# Patient Record
Sex: Male | Born: 1945
Health system: Southern US, Community
[De-identification: ages and names within clinical notes are randomized; demographics above are authoritative.]

## PROBLEM LIST (undated history)

## (undated) DIAGNOSIS — Z720 Tobacco use: Secondary | ICD-10-CM

## (undated) DIAGNOSIS — N400 Enlarged prostate without lower urinary tract symptoms: Secondary | ICD-10-CM

## (undated) DIAGNOSIS — D509 Iron deficiency anemia, unspecified: Secondary | ICD-10-CM

## (undated) DIAGNOSIS — D649 Anemia, unspecified: Secondary | ICD-10-CM

## (undated) DIAGNOSIS — I6529 Occlusion and stenosis of unspecified carotid artery: Secondary | ICD-10-CM

## (undated) DIAGNOSIS — I679 Cerebrovascular disease, unspecified: Secondary | ICD-10-CM

## (undated) DIAGNOSIS — E119 Type 2 diabetes mellitus without complications: Secondary | ICD-10-CM

## (undated) DIAGNOSIS — I2510 Atherosclerotic heart disease of native coronary artery without angina pectoris: Secondary | ICD-10-CM

## (undated) DIAGNOSIS — E785 Hyperlipidemia, unspecified: Secondary | ICD-10-CM

## (undated) DIAGNOSIS — Z860101 Personal history of adenomatous and serrated colon polyps: Secondary | ICD-10-CM

## (undated) DIAGNOSIS — I10 Essential (primary) hypertension: Secondary | ICD-10-CM

## (undated) DIAGNOSIS — Z86010 Personal history of colonic polyps: Secondary | ICD-10-CM

## (undated) HISTORY — DX: Tobacco use: Z72.0

## (undated) HISTORY — PX: CORONARY ARTERY BYPASS GRAFT: SHX141

## (undated) HISTORY — DX: Benign prostatic hyperplasia without lower urinary tract symptoms: N40.0

## (undated) HISTORY — DX: Atherosclerotic heart disease of native coronary artery without angina pectoris: I25.10

## (undated) HISTORY — DX: Occlusion and stenosis of unspecified carotid artery: I65.29

## (undated) HISTORY — PX: COLONOSCOPY W/ POLYPECTOMY: SHX1380

## (undated) HISTORY — DX: Hyperlipidemia, unspecified: E78.5

## (undated) HISTORY — DX: Essential (primary) hypertension: I10

## (undated) HISTORY — DX: Type 2 diabetes mellitus without complications: E11.9

## (undated) HISTORY — DX: Personal history of colonic polyps: Z86.010

## (undated) HISTORY — DX: Cerebrovascular disease, unspecified: I67.9

## (undated) HISTORY — DX: Iron deficiency anemia, unspecified: D50.9

## (undated) HISTORY — DX: Personal history of adenomatous and serrated colon polyps: Z86.0101

---

## 1994-07-25 HISTORY — PX: CERVICAL DISCECTOMY: SHX98

## 1998-11-27 ENCOUNTER — Encounter: Payer: Self-pay | Admitting: Neurosurgery

## 1998-11-27 ENCOUNTER — Ambulatory Visit (HOSPITAL_COMMUNITY): Admission: RE | Admit: 1998-11-27 | Discharge: 1998-11-27 | Payer: Self-pay | Admitting: Neurosurgery

## 1999-10-17 ENCOUNTER — Ambulatory Visit (HOSPITAL_COMMUNITY): Admission: RE | Admit: 1999-10-17 | Discharge: 1999-10-17 | Payer: Self-pay | Admitting: Neurosurgery

## 1999-10-17 ENCOUNTER — Encounter: Payer: Self-pay | Admitting: Neurosurgery

## 1999-11-25 ENCOUNTER — Encounter: Payer: Self-pay | Admitting: Neurosurgery

## 1999-11-25 ENCOUNTER — Ambulatory Visit (HOSPITAL_COMMUNITY): Admission: RE | Admit: 1999-11-25 | Discharge: 1999-11-25 | Payer: Self-pay | Admitting: Neurosurgery

## 1999-12-13 ENCOUNTER — Ambulatory Visit (HOSPITAL_COMMUNITY): Admission: RE | Admit: 1999-12-13 | Discharge: 1999-12-13 | Payer: Self-pay | Admitting: Neurosurgery

## 2000-11-15 ENCOUNTER — Ambulatory Visit (HOSPITAL_COMMUNITY): Admission: RE | Admit: 2000-11-15 | Discharge: 2000-11-15 | Payer: Self-pay | Admitting: Neurosurgery

## 2000-11-15 ENCOUNTER — Encounter: Payer: Self-pay | Admitting: Neurosurgery

## 2001-05-04 ENCOUNTER — Encounter: Payer: Self-pay | Admitting: Family Medicine

## 2001-05-04 ENCOUNTER — Ambulatory Visit (HOSPITAL_COMMUNITY): Admission: RE | Admit: 2001-05-04 | Discharge: 2001-05-04 | Payer: Self-pay | Admitting: Family Medicine

## 2002-07-05 ENCOUNTER — Emergency Department (HOSPITAL_COMMUNITY): Admission: EM | Admit: 2002-07-05 | Discharge: 2002-07-06 | Payer: Self-pay | Admitting: Internal Medicine

## 2002-07-24 ENCOUNTER — Encounter (HOSPITAL_COMMUNITY): Admission: RE | Admit: 2002-07-24 | Discharge: 2002-08-23 | Payer: Self-pay | Admitting: Family Medicine

## 2002-08-27 ENCOUNTER — Ambulatory Visit (HOSPITAL_COMMUNITY): Admission: RE | Admit: 2002-08-27 | Discharge: 2002-08-27 | Payer: Self-pay | Admitting: Family Medicine

## 2002-08-27 ENCOUNTER — Encounter: Payer: Self-pay | Admitting: Family Medicine

## 2002-09-04 ENCOUNTER — Ambulatory Visit (HOSPITAL_COMMUNITY): Admission: RE | Admit: 2002-09-04 | Discharge: 2002-09-04 | Payer: Self-pay | Admitting: Family Medicine

## 2002-09-04 ENCOUNTER — Encounter: Payer: Self-pay | Admitting: Family Medicine

## 2002-10-04 ENCOUNTER — Ambulatory Visit (HOSPITAL_COMMUNITY): Admission: RE | Admit: 2002-10-04 | Discharge: 2002-10-04 | Payer: Self-pay | Admitting: Family Medicine

## 2002-10-04 ENCOUNTER — Encounter: Payer: Self-pay | Admitting: Family Medicine

## 2002-12-05 ENCOUNTER — Ambulatory Visit (HOSPITAL_COMMUNITY): Admission: RE | Admit: 2002-12-05 | Discharge: 2002-12-05 | Payer: Self-pay | Admitting: *Deleted

## 2002-12-18 ENCOUNTER — Inpatient Hospital Stay (HOSPITAL_COMMUNITY)
Admission: RE | Admit: 2002-12-18 | Discharge: 2002-12-23 | Payer: Self-pay | Admitting: Thoracic Surgery (Cardiothoracic Vascular Surgery)

## 2002-12-18 ENCOUNTER — Encounter: Payer: Self-pay | Admitting: Thoracic Surgery (Cardiothoracic Vascular Surgery)

## 2002-12-19 ENCOUNTER — Encounter: Payer: Self-pay | Admitting: Thoracic Surgery (Cardiothoracic Vascular Surgery)

## 2002-12-20 ENCOUNTER — Encounter: Payer: Self-pay | Admitting: Thoracic Surgery (Cardiothoracic Vascular Surgery)

## 2002-12-31 ENCOUNTER — Ambulatory Visit (HOSPITAL_COMMUNITY): Admission: RE | Admit: 2002-12-31 | Discharge: 2002-12-31 | Payer: Self-pay | Admitting: *Deleted

## 2002-12-31 ENCOUNTER — Encounter: Payer: Self-pay | Admitting: *Deleted

## 2003-01-16 ENCOUNTER — Ambulatory Visit (HOSPITAL_COMMUNITY): Admission: RE | Admit: 2003-01-16 | Discharge: 2003-01-16 | Payer: Self-pay | Admitting: Cardiology

## 2003-02-03 ENCOUNTER — Encounter (HOSPITAL_COMMUNITY): Admission: RE | Admit: 2003-02-03 | Discharge: 2003-03-05 | Payer: Self-pay | Admitting: *Deleted

## 2003-02-04 ENCOUNTER — Encounter
Admission: RE | Admit: 2003-02-04 | Discharge: 2003-02-04 | Payer: Self-pay | Admitting: Thoracic Surgery (Cardiothoracic Vascular Surgery)

## 2003-02-04 ENCOUNTER — Encounter: Payer: Self-pay | Admitting: Thoracic Surgery (Cardiothoracic Vascular Surgery)

## 2003-06-09 ENCOUNTER — Emergency Department (HOSPITAL_COMMUNITY): Admission: EM | Admit: 2003-06-09 | Discharge: 2003-06-09 | Payer: Self-pay | Admitting: Emergency Medicine

## 2003-06-10 ENCOUNTER — Ambulatory Visit (HOSPITAL_COMMUNITY): Admission: RE | Admit: 2003-06-10 | Discharge: 2003-06-10 | Payer: Self-pay | Admitting: Family Medicine

## 2003-06-17 ENCOUNTER — Observation Stay (HOSPITAL_COMMUNITY): Admission: RE | Admit: 2003-06-17 | Discharge: 2003-06-18 | Payer: Self-pay

## 2003-06-17 ENCOUNTER — Encounter (INDEPENDENT_AMBULATORY_CARE_PROVIDER_SITE_OTHER): Payer: Self-pay | Admitting: *Deleted

## 2003-07-26 HISTORY — PX: CHOLECYSTECTOMY: SHX55

## 2004-03-19 ENCOUNTER — Ambulatory Visit (HOSPITAL_COMMUNITY): Admission: RE | Admit: 2004-03-19 | Discharge: 2004-03-19 | Payer: Self-pay | Admitting: Family Medicine

## 2004-04-14 ENCOUNTER — Ambulatory Visit (HOSPITAL_COMMUNITY): Admission: RE | Admit: 2004-04-14 | Discharge: 2004-04-14 | Payer: Self-pay | Admitting: Cardiology

## 2004-05-28 ENCOUNTER — Ambulatory Visit: Payer: Self-pay | Admitting: *Deleted

## 2004-11-29 ENCOUNTER — Ambulatory Visit (HOSPITAL_COMMUNITY): Admission: RE | Admit: 2004-11-29 | Discharge: 2004-11-29 | Payer: Self-pay | Admitting: Neurosurgery

## 2005-04-08 ENCOUNTER — Emergency Department (HOSPITAL_COMMUNITY): Admission: EM | Admit: 2005-04-08 | Discharge: 2005-04-08 | Payer: Self-pay | Admitting: Emergency Medicine

## 2005-07-20 ENCOUNTER — Emergency Department (HOSPITAL_COMMUNITY): Admission: EM | Admit: 2005-07-20 | Discharge: 2005-07-20 | Payer: Self-pay | Admitting: Emergency Medicine

## 2005-08-19 ENCOUNTER — Ambulatory Visit: Payer: Self-pay | Admitting: *Deleted

## 2005-10-17 ENCOUNTER — Ambulatory Visit: Payer: Self-pay | Admitting: *Deleted

## 2006-02-22 ENCOUNTER — Encounter: Admission: RE | Admit: 2006-02-22 | Discharge: 2006-02-22 | Payer: Self-pay | Admitting: Neurosurgery

## 2007-06-13 ENCOUNTER — Ambulatory Visit (HOSPITAL_COMMUNITY): Admission: RE | Admit: 2007-06-13 | Discharge: 2007-06-13 | Payer: Self-pay | Admitting: Family Medicine

## 2007-06-20 ENCOUNTER — Ambulatory Visit: Payer: Self-pay | Admitting: Urgent Care

## 2007-07-04 ENCOUNTER — Ambulatory Visit (HOSPITAL_COMMUNITY): Admission: RE | Admit: 2007-07-04 | Discharge: 2007-07-04 | Payer: Self-pay | Admitting: Gastroenterology

## 2007-07-04 ENCOUNTER — Encounter: Payer: Self-pay | Admitting: Gastroenterology

## 2007-07-04 ENCOUNTER — Ambulatory Visit: Payer: Self-pay | Admitting: Gastroenterology

## 2007-07-04 HISTORY — PX: ESOPHAGOGASTRODUODENOSCOPY: SHX1529

## 2009-02-11 ENCOUNTER — Ambulatory Visit (HOSPITAL_COMMUNITY): Admission: RE | Admit: 2009-02-11 | Discharge: 2009-02-11 | Payer: Self-pay | Admitting: Family Medicine

## 2009-04-06 DIAGNOSIS — I10 Essential (primary) hypertension: Secondary | ICD-10-CM | POA: Insufficient documentation

## 2009-04-06 DIAGNOSIS — E785 Hyperlipidemia, unspecified: Secondary | ICD-10-CM | POA: Insufficient documentation

## 2009-04-07 ENCOUNTER — Ambulatory Visit: Payer: Self-pay | Admitting: Cardiology

## 2009-04-07 DIAGNOSIS — I679 Cerebrovascular disease, unspecified: Secondary | ICD-10-CM | POA: Insufficient documentation

## 2009-04-07 DIAGNOSIS — F17200 Nicotine dependence, unspecified, uncomplicated: Secondary | ICD-10-CM | POA: Insufficient documentation

## 2009-04-07 DIAGNOSIS — Z87898 Personal history of other specified conditions: Secondary | ICD-10-CM | POA: Insufficient documentation

## 2009-04-07 DIAGNOSIS — E119 Type 2 diabetes mellitus without complications: Secondary | ICD-10-CM | POA: Insufficient documentation

## 2009-04-07 DIAGNOSIS — I2510 Atherosclerotic heart disease of native coronary artery without angina pectoris: Secondary | ICD-10-CM | POA: Insufficient documentation

## 2009-04-07 DIAGNOSIS — M5030 Other cervical disc degeneration, unspecified cervical region: Secondary | ICD-10-CM | POA: Insufficient documentation

## 2009-05-08 ENCOUNTER — Ambulatory Visit: Payer: Self-pay | Admitting: Cardiology

## 2009-05-08 ENCOUNTER — Ambulatory Visit (HOSPITAL_COMMUNITY): Admission: RE | Admit: 2009-05-08 | Discharge: 2009-05-08 | Payer: Self-pay | Admitting: Cardiology

## 2009-05-08 ENCOUNTER — Encounter: Payer: Self-pay | Admitting: Cardiology

## 2009-05-13 ENCOUNTER — Encounter: Payer: Self-pay | Admitting: Cardiology

## 2009-05-15 ENCOUNTER — Encounter: Payer: Self-pay | Admitting: Cardiology

## 2009-06-04 ENCOUNTER — Ambulatory Visit: Payer: Self-pay | Admitting: Cardiology

## 2009-06-05 ENCOUNTER — Encounter: Payer: Self-pay | Admitting: Cardiology

## 2009-06-10 ENCOUNTER — Ambulatory Visit (HOSPITAL_COMMUNITY): Admission: RE | Admit: 2009-06-10 | Discharge: 2009-06-10 | Payer: Self-pay | Admitting: Cardiology

## 2009-06-11 ENCOUNTER — Encounter (INDEPENDENT_AMBULATORY_CARE_PROVIDER_SITE_OTHER): Payer: Self-pay | Admitting: *Deleted

## 2009-10-06 ENCOUNTER — Ambulatory Visit: Payer: Self-pay | Admitting: Cardiology

## 2010-08-26 NOTE — Letter (Signed)
Summary: Stress Echocardiogram Information Sheet  [facility] at [facility]  [address] [place] [address]   Phone: [phone]  Fax: [phone]      April 07, 2009 MRN: [id number] light prior to the test.   Kyle Daniel  Doctor: Appointment Date: Appointment Time: Appointment Location: [facility]  Stress Echocardiogram Information Sheet    Instructions:   1. DO NOT  take your _________ medicine _______ days before the test.  2. Eat light prior to the test.  3. Dress prepared to exercise.  4. DO NOT use ANY caffine or tobacco products 3 hours before appointment.  5. Report to the Short Stay Center on the1st floor.  6. Please bring all current prescription medications.  7. If you have any questions, please call [phone]

## 2010-08-26 NOTE — Letter (Signed)
Summary: [place] Results Engineer, agricultural at [facility]  [address] [place] [address]   Phone: [phone]  Fax: [phone]      May 13, 2009 MRN: [id number]   [facility] [address] [place], [place]  [address]   Dear [name],  Your test ordered by Selena Batten has been reviewed by your physician (or physician assistant) and was found to be normal or stable. Your physician (or physician assistant) felt no changes were needed at this time.  __X__ Echocardiogram  ____ Cardiac Stress Test  ____ Lab Work  ____ Peripheral vascular study of arms, legs or neck  ____ CT scan or X-ray  ____ Lung or Breathing test  ____ Other:   Thank you.   Larita Fife Via LPN  Please remain on current medical treatment.  *[place] Bing, MD, F.A.C.Gaylord Shih, MD, F.A.C.C Lewayne Bunting, MD, F.A.C.C Nona Dell, MD, F.A.C.C Charlton Haws, MD, Lenise Arena.C.C

## 2010-08-26 NOTE — Letter (Signed)
Summary: labs from [facility]  labs from [facility]   Imported By: Dreama Saa, CNA 06/22/2009 15:59:57  _____________________________________________________________________  External Attachment:    Type:   Image     Comment:   External Document

## 2010-08-26 NOTE — Assessment & Plan Note (Signed)
Summary: PER PT PHONE CALL/TG   Visit Type:  Follow-up Referring Provider:  [facility] Primary Provider:  Dr.Mcinnis   History of Present Illness: Return visit for this nice gentleman with coronary disease and cardiovascular risk factors.  This visit was originally scheduled for October 22, but the patient felt that he had waited too long and walked out of the office.  His attitude seems good at present.  He is willing to cooperate in optimal management of his risk factors.  He is primarily followed at the [facility], where he is seen every 3 months.  He reports that a recent hemoglobin A1c level was 6.8.  HDL was 38 and LDL 125.  Unfortunately, he continues to smoke cigarettes.  An increased dose of Prevacid has been a ordered, but not yet received by him.  Blood pressures at home have been variable with systolics ranging from 130 to 160.   Current Medications (verified): 1)  Prazosin Hcl 5 Mg Caps (Prazosin Hcl) .... Take 1 Tablet At Bedtime 2)  Simvastatin 40 Mg Tabs (Simvastatin) .... Take 1 Tab Daily 3)  Aspirin 81 Mg Tbec (Aspirin) .... Take One Tablet By Mouth Daily 4)  Atenolol 50 Mg Tabs (Atenolol) .... Take 1 Tab Two Times A Day 5)  Finasteride 5 Mg Tabs (Finasteride) .... Take 1 Tab Daily 6)  Hydrochlorothiazide 25 Mg Tabs (Hydrochlorothiazide) .... Take 1 Tab Daily 7)  Ranitidine Hcl 150 Mg Tabs (Ranitidine Hcl) .... Take 1 Tab Two Times A Day 8)  Felodipine 10 Mg Xr24h-Tab (Felodipine) .... Take 1 Tab Daily 9)  Pantoprazole Sodium 40 Mg Tbec (Pantoprazole Sodium) .... Take 1 Tab Daily 10)  Actos 30 Mg Tabs (Pioglitazone Hcl) .... Take 1 Tab Daily 11)  Metformin Hcl 850 Mg Tabs (Metformin Hcl) .... Take 1 Tab Two Times A Day 12)  Ra Fish Oil 1000 Mg Caps (Omega-3 Fatty Acids) .... Take 2 Tabs Two Times A Day 13)  Niacin 500 Mg Tabs (Niacin) .... Take 3 Tablets By Mouth Daily 14)  Amlodipine Besylate 10 Mg Tabs (Amlodipine Besylate) .... Take 1/2 Tab  Daily 15)  Novolin N 100 Unit/ml Susp (Insulin Isophane Human) .... 35units Am and 25units Pm  Allergies (verified): No Known Drug Allergies  Review of Systems       see history of present illness  Vital Signs:  Patient profile:   65 year old male Weight:      268 pounds BMI:     35.49 Pulse rate:   68 / minute BP sitting:   156 / 80  (right arm)  Vitals Entered By: Dreama Saa, CNA (June 04, 2009 11:14 AM)  Physical Exam  General:   General-Well developed; no acute distress:Somewhat overweight   Neck-No JVD; bilateral carotid bruits: Lungs-No tachypnea, no rales; no rhonchi; no wheezes; median sternotomy scar Cardiovascular-normal PMI; distant S1 and S2 Abdomen-BS normal; soft and non-tender without masses or organomegaly:  Musculoskeletal-No deformities, no cyanosis or clubbing: Neurologic-Normal cranial nerves; symmetric strength and tone:  Skin-Warm, no significant lesions:Scar over harvest site for radial artery on right arm Extremities-Nl distal pulses; no edema:     Impression & Recommendations:  Problem # 1:  CEREBROVASCULAR DISEASE (ICD-437.9) Carotid ultrasound study to be performed and reviewed.  Problem # 2:  HYPERTENSION (ICD-401.9) Control of hypertension continues to be suboptimal, but increase therapy has not yet been initiated.  [name] will continue to monitor blood pressure, call for any significant elevations, [facility]  Problem # 3:  HYPERCHOLESTEROLEMIA (ICD-272.0) Based on the information available, controll of hyperlipidemia is suboptimal.  Niacin will be titrated to a total of 1500 mg per day.  Lipid status should be reassessed in 3 months at his next [place] visit.  I will plan to see this nice gentleman again in 4 months.  Other Orders: Carotid Duplex (Carotid Duplex)  Patient Instructions: 1)  Your physician recommends that you schedule a follow-up appointment in: 4 MONTHS 2)   Your physician has recommended you make the following change in your medication: INCREASE NIACIN TO 1000MG  ONCE DAILY X2 WEEKS THEN 1500MG  ONCE DAILY, DECREASE ASPIRIN TO 81MG  DAILY 3)  Your physician has requested that you have a carotid duplex. This test is an ultrasound of the carotid arteries in your neck. It looks at blood flow through these arteries that supply the brain with blood. Allow one hour for this exam. There are no restrictions or special instructions.

## 2010-08-26 NOTE — Letter (Signed)
Summary: [place] Results Engineer, agricultural at [facility]  [address] [place] [address]   Phone: [phone]  Fax: [phone]      June 11, 2009 MRN: [id number]   [facility] [address] [place], [place]  [address]   Dear Mr. Brooke Dare,  Your test ordered by Selena Batten has been reviewed by your physician (or physician assistant) and was found to be normal or stable. Your physician (or physician assistant) felt no changes were needed at this time.  ____ Echocardiogram  ____ Cardiac Stress Test  ____ Lab Work  __X__ Peripheral vascular study of arms, legs or neck  ____ CT scan or X-ray  ____ Lung or Breathing test  ____ Other:  No change in medical treatment at this time, per Dr. Dietrich Pates.  Thank you,   Trevor Duty Allyne Gee RN    [place] Bing, MD, Lenise Arena.C.Gaylord Shih, MD, F.A.C.C Lewayne Bunting, MD, F.A.C.C Nona Dell, MD, F.A.C.C Charlton Haws, MD, Lenise Arena.C.C

## 2010-08-26 NOTE — Assessment & Plan Note (Signed)
Summary: 1 mth f/u -***Pt not seen->Delete   Visit Type:  Follow-up    Current Medications (verified): 1)  Prazosin Hcl 5 Mg Caps (Prazosin Hcl) .... Take 1 Tablet At Bedtime 2)  Simvastatin 40 Mg Tabs (Simvastatin) .... Take 1 Tab Daily 3)  Aspirin 325 Mg Tabs (Aspirin) .... Take 1 Tab Daily 4)  Atenolol 50 Mg Tabs (Atenolol) .... Take 1 Tab Two Times A Day 5)  Finasteride 5 Mg Tabs (Finasteride) .... Take 1 Tab Daily 6)  Hydrochlorothiazide 25 Mg Tabs (Hydrochlorothiazide) .... Take 1 Tab Daily 7)  Ranitidine Hcl 150 Mg Tabs (Ranitidine Hcl) .... Take 1 Tab Two Times A Day 8)  Felodipine 10 Mg Xr24h-Tab (Felodipine) .... Take 1 Tab Daily 9)  Pantoprazole Sodium 40 Mg Tbec (Pantoprazole Sodium) .... Take 1 Tab Daily 10)  Actos 30 Mg Tabs (Pioglitazone Hcl) .... Take 1 Tab Daily 11)  Metformin Hcl 850 Mg Tabs (Metformin Hcl) .... Take 1 Tab Two Times A Day 12)  Ra Fish Oil 1000 Mg Caps (Omega-3 Fatty Acids) .... Take 2 Tabs Two Times A Day 13)  Niaspan 500 Mg Cr-Tabs (Niacin (Antihyperlipidemic)) .... Take 1 Tab Daily 14)  Amlodipine Besylate 10 Mg Tabs (Amlodipine Besylate) .... Take 1/2 Tab Daily 15)  Novolin N 100 Unit/ml Susp (Insulin Isophane Human) .... 35units Am and 25units Pm  Allergies (verified): No Known Drug Allergies  Vital Signs:  Patient profile:   64 year old male Weight:      271 pounds Pulse rate:   64 / minute BP sitting:   150 / 85  (right arm)  Vitals Entered By: Dreama Saa, CNA (May 15, 2009 1:20 PM)

## 2010-08-26 NOTE — Assessment & Plan Note (Signed)
Summary: PAST DUE FOR F/U/PREVIOUS DR.HARDIN PT/TG   Visit Type:  past due for fu Primary Provider:  Dr.Mcinnis   History of Present Illness: [name] is a 65 year old gentleman with coronary artery disease lost to followup in our practice for the past 3 years.  He has been followed medically at the [facility].  He is seen at his request for recurrent chest discomfort.  He describes  lancinating episodes of left upper chest discomfort that are sharp and last for a matter of seconds.  These are unrelated to exertion.  There is no radiation.  There were no associated symptoms.  Episodes occur once every few months.  He underwent cardiac catheterization in 2005 approximately one year after coronary artery bypass graft surgery, which revealed patent grafts.  He has had no recent stress test.  Unfortunately, he continues to smoke cigarettes at the rate of one pack per day.  Diabetic control has been good.  He believes it controlled hyperlipidemia has also been good.  Laboratory tests were obtained at the [place] a month ago, but he is unaware of the results.  Cardiac Cath  Procedure date:  04/14/2004  Findings:      HEMODYNAMICS:  RA 13, RV 37/11/17, PA 41/19/29, PCW 21/19/17, aorta  136/80/102, LV 140/12/20.  No aortic stenosis or mitral stenosis.   VENTRICULOGRAPHY:  1.  EF greater than 65% without regional wall motion abnormality.  No mitral      regurgitation.  2.  Left main:  60% stenosis of the mid vessel.  3.  LAD:  Moderate size vessel giving rise to a single diagonal branch.      There is a 50% stenosis of the proximal vessel.  The LIMA to LAD is      widely patent with excellent distal runoff.  The left subclavian is      without stenosis.  4.  Ramus intermedius:  70% proximal stenosis.  Widely patent free radial      graft with excellent distal runoff.  5.  Circumflex:  Moderate size vessel giving rise to a single obtuse      marginal.  There is a 60% stenosis in the  proximal vessel.  The      saphenous vein graft to the marginal is widely patent with excellent      distal runoff.  6.  RCA:  Relatively small though dominant vessel.  Minor luminal      irregularities along its course.   IMPRESSION/RECOMMENDATIONS:  Widely patent bypass grafts with excellent  distal runoff.  I suspect a noncardiac etiology to his chest discomfort.  Will continue medical therapy.    Current Medications (verified): 1)  Prazosin Hcl 5 Mg Caps (Prazosin Hcl) .... Take 1 Tablet At Bedtime 2)  Simvastatin 40 Mg Tabs (Simvastatin) .... Take 1 Tab Daily 3)  Aspirin 325 Mg Tabs (Aspirin) .... Take 1 Tab Daily 4)  Atenolol 50 Mg Tabs (Atenolol) .... Take 1 Tab Two Times A Day 5)  Finasteride 5 Mg Tabs (Finasteride) .... Take 1 Tab Daily 6)  Hydrochlorothiazide 25 Mg Tabs (Hydrochlorothiazide) .... Take 1 Tab Daily 7)  Ranitidine Hcl 150 Mg Tabs (Ranitidine Hcl) .... Take 1 Tab Two Times A Day 8)  Dicyclomine Hcl 20 Mg Tabs (Dicyclomine Hcl) .... Take 1 Tab Three Times A Day 9)  Felodipine 10 Mg Xr24h-Tab (Felodipine) .... Take 1 Tab Daily 10)  Niaspan 500 Mg Cr-Tabs (Niacin (Antihyperlipidemic)) .... Take 1 Tab Nightly 11)  Pantoprazole Sodium  40 Mg Tbec (Pantoprazole Sodium) .... Take 1 Tab Daily 12)  Actos 30 Mg Tabs (Pioglitazone Hcl) .... Take 1 Tab Daily 13)  Metformin Hcl 850 Mg Tabs (Metformin Hcl) .... Take 1 Tab Two Times A Day 14)  Ra Fish Oil 1000 Mg Caps (Omega-3 Fatty Acids) .... Take 2 Tabs Two Times A Day 15)  Niaspan 500 Mg Cr-Tabs (Niacin (Antihyperlipidemic)) .... Take 1 Tab Daily 16)  Amlodipine Besylate 10 Mg Tabs (Amlodipine Besylate) .... Take 1/2 Tab Daily 17)  Ranitidine Hcl 150 Mg Tabs (Ranitidine Hcl) .... Take 1 Tab Two Times A Day 18)  Novolin N 100 Unit/ml Susp (Insulin Isophane Human) .... 35units Am and 25units Pm  Allergies (verified): No Known Drug Allergies  Past History:  PMH, FH, and Social History reviewed and updated.  Past Medical  History: ASCVD--status post coronary artery bypass graft surgery in 2004; cardiac catheterization in 2005 revealed   patent grafts. Tobacco abuse--50 pack years; current--one pack per day HYPERCHOLESTEROLEMIA (ICD-272.0) HYPERTENSION (ICD-401.9) AODM--insulin requiring for the past 15 years Cervical spine disease Benign prostatic hypertrophy Cerebrovascular disease--bilateral bruits  Past Surgical History: 4 adenomatous polyps removed colonoscopy Cholecystectomy--2005 Coronary artery bypass graft surgery Anterior C.spine discectomy and fusion--1996  Family History: Reviewed history from 04/06/2009 and no changes required. Father:deceased age 91 secondary to pneumonia Mother:deceased age 41 secondary to diabectic complications Siblings:2 sisters with breast cancer,2 healthy sisters  Social History: Reviewed history from 04/06/2009 and no changes required. Retired  Married  Tobacco Use - Yes.  Alcohol Use - yes(stopped in early 1980's) Regular Exercise - no Drug Use - no  Review of Systems       Patient reports some minor symptoms of prostatism with nocturia and some hesitancy.  He has class III dyspnea on exertion.  He has had some abdominal discomfort in the past with endoscopy showing duodenitis.  All other systems reviewed and are negative.  Vital Signs:  Patient profile:   65 year old male Height:      73 inches Weight:      273 pounds BMI:     36.15 Pulse rate:   69 / minute BP sitting:   151 / 78  (right arm)  Vitals Entered By: Dreama Saa, CNA (April 07, 2009 2:04 PM)  Physical Exam  General:   General-Well developed; no acute distress:Somewhat overweight   Neck-No JVD; bilateral carotid bruits: Lungs-No tachypnea, no rales; no rhonchi; no wheezes: Cardiovascular-normal PMI; normal S1 and S2:minimal systolic murmur Abdomen-BS normal; soft and non-tender without masses or organomegaly:  Musculoskeletal-No deformities, no cyanosis or clubbing:  Neurologic-Normal cranial nerves; symmetric strength and tone:  Skin-Warm, no significant lesions:Scar over harvest site for radial artery on right arm Extremities-Nl distal pulses; no edema:     Impression & Recommendations:  Problem # 1:  ATHEROSCLEROTIC CARDIOVASCULAR DISEASE (ICD-429.2) Symptoms are atypical.  I doubt that these reflect recurrent myocardial ischemia.  Due to the changes on his electrocardiogram and the significant risk of progression of coronary disease at 6 years following coronary artery bypass graft surgery, stress testing is warranted.  We will proceed with a stress echocardiogram.  Orders: Stress Echo (Stress Echo)  Problem # 2:  TOBACCO ABUSE (ICD-305.1) The destructive nature of continued use of tobacco products was discussed with him.  He has tried Chantix and nicotine patches in the past without benefit.  I suggested that he seek a formal smoking cessation program, but do not believe that he is willing to accept that step at present.  Problem # 3:  HYPERTENSION (ICD-401.9) Control of hypertension is suboptimal.  We will request that the patient monitor his blood pressure at home.  His dose of Minipress will be increased to 5 mg q.d.  Problem # 4:  CEREBROVASCULAR DISEASE (ICD-437.9) Bilateral carotid bruits are present.  He describes no symptoms to suggest neurologic events.  A duplex study for many years ago was negative.  He will require repeat assessment in the near future.  Problem # 5:  DIABETES MELLITUS, TYPE II (ICD-250.00) We will seek results of her recent hemoglobin A1c level and lipid profile performed at the [facility].  I will reassess thisnice gentleman in the office in one month.  Patient Instructions: 1)  Your physician recommends that you schedule a follow-up appointment in: 1 month 2)  Your physician has recommended you make the following change in your medication: Start taking Prazosin 4mg  once daily  3)  Hold Atenolol for 2 days prior  to stress Echo 4)  Your physician has requested that you have a stress echocardiogram. For further information please visit [email or web address]  Please follow instruction sheet as given. Prescriptions: PRAZOSIN HCL 5 MG CAPS (PRAZOSIN HCL) take 1 tablet at bedtime  #30 x 11   Entered by:   Larita Fife Via LPN   Authorized by:   Kathlen Brunswick, MD, [facility]   Signed by:   Larita Fife Via LPN on 98/05/9146   Method used:   Print then Give to Patient   RxID:   [id number] PRAZOSIN HCL 2 MG CAPS (PRAZOSIN HCL) take 2 tablets (4mg ) by mouth once daily  #60 x 6   Entered by:   Larita Fife Via LPN   Authorized by:   Kathlen Brunswick, MD, [facility]   Signed by:   Larita Fife Via LPN on 96/29/5284   Method used:   Print then Give to Patient   RxID:   [id number]  RX for Prazosin 2mg  is incorrect (put in shred box). Patient given correct RX for 5mg . Larita Fife Via LPN  April 08, 2009 10:26 AM

## 2010-08-26 NOTE — Assessment & Plan Note (Signed)
Summary: F4M   Visit Type:  Follow-up Referring Provider:  [facility] Primary Provider:  Dr.Mcinnis   History of Present Illness: Return visit for this very pleasant 65 year old gentleman with coronary disease and multiple cardiovascular risk factors.  He denies all cardiopulmonary symptoms now and in the interval since bypass graft surgery 7 years ago.  He checks blood pressure occasionally with good results.  He exercises modestly.  Unfortunately he continues to smoke cigarettes.  Current Medications (verified): 1)  Prazosin Hcl 5 Mg Caps (Prazosin Hcl) .... Take 2 Tablets At Betime 2)  Simvastatin 40 Mg Tabs (Simvastatin) .... Take 1/2  Tab Daily 3)  Aspirin 81 Mg Tbec (Aspirin) .... Take One Tablet By Mouth Daily 4)  Atenolol 50 Mg Tabs (Atenolol) .... Take 1 Tab Two Times A Day 5)  Finasteride 5 Mg Tabs (Finasteride) .... Take 1 Tab Daily 6)  Hydrochlorothiazide 25 Mg Tabs (Hydrochlorothiazide) .... Take 1 Tab Daily 7)  Ranitidine Hcl 150 Mg Tabs (Ranitidine Hcl) .... Take 1 Tab Two Times A Day 8)  Actos 30 Mg Tabs (Pioglitazone Hcl) .... Take 1 Tab Daily 9)  Metformin Hcl 850 Mg Tabs (Metformin Hcl) .... Take 1 Tab Two Times A Day 10)  Ra Fish Oil 1000 Mg Caps (Omega-3 Fatty Acids) .... Take 2 Tabs Two Times A Day 11)  Niacin 500 Mg Tabs (Niacin) .... Take 3 Tablets By Mouth Daily 12)  Amlodipine Besylate 10 Mg Tabs (Amlodipine Besylate) .... Take 1/2 Tab Daily 13)  Novolin N 100 Unit/ml Susp (Insulin Isophane Human) .... 35units Am and 25units Pm 14)  Diclofenac Sodium 75 Mg Tbec (Diclofenac Sodium) .... Take 1 Tab Two Times A Day  Allergies (verified): No Known Drug Allergies  Past History:  PMH, FH, and Social History reviewed and updated.  Past Medical History: ASCVD--status post coronary artery bypass graft surgery in 2004; cardiac catheterization in 2005 revealed   patent grafts. Tobacco abuse--50 pack years; current-one pack per  day HYPERCHOLESTEROLEMIA (ICD-272.0) HYPERTENSION (ICD-401.9) AODM--insulin requiring for the past 15 years Cervical spine disease Benign prostatic hypertrophy Cerebrovascular disease--bilateral bruits  Review of Systems  The patient denies anorexia, fever, weight loss, weight gain, vision loss, decreased hearing, hoarseness, chest pain, syncope, dyspnea on exertion, peripheral edema, prolonged cough, headaches, hemoptysis, abdominal pain, melena, and hematochezia.    Vital Signs:  Patient profile:   65 year old male Weight:      268 pounds Pulse rate:   64 / minute BP sitting:   134 / 78  (right arm)  Vitals Entered By: Dreama Saa, CNA (October 06, 2009 11:14 AM)  Physical Exam  General:  Overweight; well developed; no acute distress   Neck-No JVD; faint bilateral carotid bruits: Lungs-No tachypnea, no rales; no rhonchi Cardiovascular-normal PMI; distant S1 and S2; minimal systolic murmur Abdomen-BS normal; soft and non-tender without masses or organomegaly:  Musculoskeletal-No deformities, no cyanosis or clubbing: Neurologic-Normal cranial nerves; symmetric strength and tone:  Skin-Warm, no significant lesions:Scar over harvest site for radial artery on right arm Extremities-Nl distal pulses; no edema:     Impression & Recommendations:  Problem # 1:  CEREBROVASCULAR DISEASE (ICD-437.9) Carotid ultrasound study shows extensive calcification and plaque without significant focal stenosis.  In the absence of symptoms, a followup study in approximately 2 years would be reasonable.  I explained to the patient that optimal management of risk factors is the most important approach to preventing symptomatic cerebrovascular disease.  Problem # 2:  ATHEROSCLEROTIC CARDIOVASCULAR DISEASE (ICD-429.2) Patient  continues to do well following CABG surgery. Risk factor management is the key to maintaining this state of affairs.  Problem # 3:  HYPERTENSION (ICD-401.9) Blood pressure  control is good on multiple agents, which will be continued.  Problem # 4:  HYPERCHOLESTEROLEMIA (ICD-272.0) Recent lipid profile from the [place] was good with total cholesterol 125, triglycerides 189, LDL of 54 and HDL of 33.  Current multidrug therapy will be continued.  Problem # 5:  DIABETES MELLITUS, TYPE II (ICD-250.00) Recent hemoglobin A1c level was 6.7 indicating excellent control of diabetes.  Problem # 6:  TOBACCO ABUSE (ICD-305.1) Patient and I had a long discussion about discontinuing tobacco products.  He has tried medications without any significant period of abstinence.  Transdermal nicotine preparations resulted skin irritation.  He has been fearful of Chantix due to possible adverse effects.  He tried Zyban without benefit.  He has participated in a number of smoking cessation programs without any positive results.  At this point, he is unwilling to accept any approach and will continue cigarette smoking knowing the risks.  I will reevaluate the status gentleman in one year.  Patient Instructions: 1)  Your physician recommends that you schedule a follow-up appointment in: 1 year 2)  Your physician discussed the importance of regular exercise and recommended that you start or continue a regular exercise program for good health.

## 2010-09-29 DIAGNOSIS — E119 Type 2 diabetes mellitus without complications: Secondary | ICD-10-CM

## 2010-09-29 DIAGNOSIS — Z9049 Acquired absence of other specified parts of digestive tract: Secondary | ICD-10-CM

## 2010-09-29 DIAGNOSIS — K219 Gastro-esophageal reflux disease without esophagitis: Secondary | ICD-10-CM

## 2010-10-20 ENCOUNTER — Ambulatory Visit (INDEPENDENT_AMBULATORY_CARE_PROVIDER_SITE_OTHER): Payer: Medicare Other | Admitting: Cardiology

## 2010-10-20 ENCOUNTER — Encounter: Payer: Self-pay | Admitting: Cardiology

## 2010-10-20 DIAGNOSIS — E782 Mixed hyperlipidemia: Secondary | ICD-10-CM

## 2010-10-20 DIAGNOSIS — E785 Hyperlipidemia, unspecified: Secondary | ICD-10-CM

## 2010-10-20 DIAGNOSIS — I679 Cerebrovascular disease, unspecified: Secondary | ICD-10-CM

## 2010-10-20 DIAGNOSIS — E119 Type 2 diabetes mellitus without complications: Secondary | ICD-10-CM

## 2010-10-20 DIAGNOSIS — F17200 Nicotine dependence, unspecified, uncomplicated: Secondary | ICD-10-CM

## 2010-10-20 DIAGNOSIS — I10 Essential (primary) hypertension: Secondary | ICD-10-CM

## 2010-10-20 DIAGNOSIS — I2510 Atherosclerotic heart disease of native coronary artery without angina pectoris: Secondary | ICD-10-CM

## 2010-10-20 MED ORDER — HYDROCHLOROTHIAZIDE 25 MG PO TABS: 12.5000 mg | ORAL_TABLET | Freq: Every day | ORAL | Status: DC

## 2010-10-20 MED ORDER — PRAVASTATIN SODIUM 80 MG PO TABS: 80.0000 mg | ORAL_TABLET | Freq: Every day | ORAL | Status: DC

## 2010-10-20 MED ORDER — MELOXICAM 15 MG PO TABS: 15.0000 mg | ORAL_TABLET | Freq: Every day | ORAL | Status: DC | PRN

## 2010-10-20 MED ORDER — RANITIDINE HCL 150 MG PO TABS: 150.0000 mg | ORAL_TABLET | Freq: Two times a day (BID) | ORAL | Status: DC | PRN

## 2010-10-20 NOTE — Progress Notes (Signed)
HPI : Mr. Sowash returns to the office for continued assessment and treatment of coronary disease and multiple cardiovascular risk factors.  Since his last visit, he has done generally well.  He has chronic class II dyspnea on exertion, but no chest discomfort, syncope, orthopnea, PND nor significant pedal edema.  He checks blood pressure at home on occasion with good results.  Lifestyle is sedentary.  He continues to smoke cigarettes, but has initiated a number of quite attempts in the past without success.  Current Outpatient Prescriptions on File Prior to Visit  Medication Sig Dispense Refill  . amLODipine (NORVASC) 10 MG tablet Take 5 mg by mouth daily.        Marland Kitchen aspirin 81 MG tablet Take 81 mg by mouth daily.        Marland Kitchen atenolol (TENORMIN) 50 MG tablet Take 50 mg by mouth 2 (two) times daily. HOLD ATENOLOL FOR 2 WEEKS TAKE HOME BP'S; IF LESS FATIGUE AND BP OK <140/90  STOP ATENOLOL      . finasteride (PROSCAR) 5 MG tablet Take 5 mg by mouth daily.        . fish oil-omega-3 fatty acids 1000 MG capsule Take 2 g by mouth 2 (two) times daily.        . insulin NPH (HUMULIN N,NOVOLIN N) 100 UNIT/ML injection Inject 35 Units into the skin 2 (two) times daily.       . metFORMIN (GLUCOPHAGE) 850 MG tablet Take 850 mg by mouth 2 (two) times daily.        . niacin 500 MG tablet Take 1,500 mg by mouth daily.        . pioglitazone (ACTOS) 30 MG tablet Take 45 mg by mouth daily.       . prazosin (MINIPRESS) 5 MG capsule Take 5 mg by mouth at bedtime. 2 tabs at bedtime      . DISCONTD: hydrochlorothiazide 25 MG tablet Take 25 mg by mouth daily.        Marland Kitchen DISCONTD: ranitidine (ZANTAC) 150 MG tablet Take 150 mg by mouth 2 (two) times daily.        . pravastatin (PRAVACHOL) 80 MG tablet Take 1 tablet (80 mg total) by mouth daily.  30 tablet  3  . DISCONTD: diclofenac (VOLTAREN) 75 MG EC tablet Take 75 mg by mouth 2 (two) times daily.        Marland Kitchen DISCONTD: insulin NPH (HUMULIN N,NOVOLIN N) 100 UNIT/ML injection Inject 25  Units into the skin every evening.       Marland Kitchen DISCONTD: simvastatin (ZOCOR) 40 MG tablet Take 20 mg by mouth daily.          No Known Allergies    Past medical history, social history, and family history reviewed and updated.  ROS: See history of present illness.  PHYSICAL EXAM: BP 126/70  Pulse 71  Ht 6\' 1"  (1.854 m)  Wt 256 lb (116.121 kg)  BMI 33.78 kg/m2  SpO2 97%  General-Well developed; no acute distress Body habitus-proportionate weight and height Neck-No JVD; right carotid bruit is present Lungs-clear lung fields; resonant to percussion; normal I: E ratio Cardiovascular-normal PMI; distant S1 and S2 Abdomen-normal bowel sounds; soft and non-tender without masses or organomegaly; midline surgical incision, which is depressed relative to the surrounding tissues Musculoskeletal-No deformities, no cyanosis or clubbing Neurologic-Normal cranial nerves; symmetric strength and tone Skin-Warm, no significant lesions Extremities-1+ distal pulses on the left; 1-2+ on the right; no edema  ASSESSMENT AND PLAN:

## 2010-10-20 NOTE — Assessment & Plan Note (Signed)
Diabetic control has been good with hemoglobin A1c level of 6.3 recently.  If patient can achieve reasonable weight loss, there should be even further improvement.  At this might permit discontinuation of one of his oral medications, preferably Actos.

## 2010-10-20 NOTE — Assessment & Plan Note (Signed)
No recent neurologic symptoms; optimal management of vascular risk factors is appropriate for this problem as well as for coronary disease.

## 2010-10-20 NOTE — Assessment & Plan Note (Addendum)
Patient describes myalgias on therapy with moderate dose statin in the setting of use of a calcium channel antagonist.  Pravastatin will be substituted for simvastatin with a repeat lipid profile to be obtained.

## 2010-10-20 NOTE — Assessment & Plan Note (Addendum)
Blood pressure is excellent.  Patient is complaining of fatigue, which will prompt temporary discontinuation of atenolol.  Patient will monitor his own blood pressure and call if significant elevations occur.  Hydrochlorothiazide will be changed to chlorthalidone at an initial dose of 12.5 mg q.d.

## 2010-10-20 NOTE — Assessment & Plan Note (Signed)
Patient has never stopped smoking for any significant period of time.  He has tried nicotine patches, which resolved rapidly and skin irritation.  He was fearful of Chantix due to possible adverse effects.  He was treated with Zyban many years ago.  He has not ever refrain from cigarette smoking for even a few days.  Patient was instructed regarding quitting methodology and the importance of the desire to stop.  We will revisit this issue at his next office appointment in 4 months.

## 2010-10-20 NOTE — Patient Instructions (Signed)
Your physician recommends that you schedule a follow-up appointment in: 4 MONTHS Your physician has recommended you make the following change in your medication: CHANGE SIMVASTATIN TO PRAVASTATIN 80MG  DAILY HOLD ATENOLOL FOR 2 WEEKS , TAKE BP AT HOME, IF LESS FATIGE AND BP OK < 140/90, DECREASE HYDROCHLORATHIAZIDE TO 12.5MG  DIALY CHANGE MELOXICAM AND RANITIDINE TO AS NEEDED  NURSE VISIT IN 1 MONTH, PLEASE BRING BLOOD PRESSURE DIARY TO NURSE VISIT

## 2010-10-20 NOTE — Assessment & Plan Note (Addendum)
No symptoms to suggest myocardial ischemia or infarction; current management will be continued.  Patient has nonspecific complaints including myalgias and malaise.  Simvastatin at his current dose of 40 mg q.d. Is not compatible with concomitant therapy with amlodipine.  The former medicine will be discontinued and pravastatin 80 mg q.d. Started with a lipid profile to be obtained in one month.  Laboratory studies have been requested from the patient's primary care provider at the [place].

## 2010-11-03 ENCOUNTER — Telehealth: Payer: Self-pay | Admitting: Cardiology

## 2010-11-03 NOTE — Telephone Encounter (Signed)
pls return call for medication questions / tg

## 2010-11-04 ENCOUNTER — Telehealth: Payer: Self-pay | Admitting: *Deleted

## 2010-11-04 NOTE — Telephone Encounter (Signed)
Pt asked if psa had been sent from [place], no results scanned into chart at this time

## 2010-11-19 ENCOUNTER — Ambulatory Visit (INDEPENDENT_AMBULATORY_CARE_PROVIDER_SITE_OTHER): Payer: Medicare Other

## 2010-11-19 DIAGNOSIS — E785 Hyperlipidemia, unspecified: Secondary | ICD-10-CM

## 2010-11-19 DIAGNOSIS — I2510 Atherosclerotic heart disease of native coronary artery without angina pectoris: Secondary | ICD-10-CM

## 2010-11-19 DIAGNOSIS — E782 Mixed hyperlipidemia: Secondary | ICD-10-CM

## 2010-11-19 MED ORDER — HYDROCHLOROTHIAZIDE 25 MG PO TABS: 25.0000 mg | ORAL_TABLET | Freq: Every day | ORAL | Status: DC

## 2010-11-19 MED ORDER — PRAVASTATIN SODIUM 80 MG PO TABS: 80.0000 mg | ORAL_TABLET | Freq: Every day | ORAL | Status: AC

## 2010-11-19 NOTE — Progress Notes (Signed)
**Note De-Identified Kyle Daniel Obfuscation** S: Pt. Arrives in office for a 1 month BP check/nurse visit. B: On last OV with Dr. Dietrich Pates pt. was advised to change from Simvastatin to Pravastatin 80mg  qhs, hold Atenolol for 2 weeks, change Meloxicam and Ranidine to as needed. Also pt. has not decreased Hydrochlorathiazide to 12.5mg  qd as advised at last OV  due to his BP has often remained above 140/90 (was to decrease if he had less fatigue and if BP remained below 140/90). A: Pt. c/o dizziness (orthostatic BP's obtained and in chart), otherwise he has no complaints at this time. He did bring his BP diary to today's visit (placed in Dr. Marvel Plan reports folder). Pt. resumed Atenolol on 4-12. BP this morning is 144/86 and on last OV it was 126/70. He continues to take Simvastatin but states he has a app[facility]. scheduled in [facility] and will have RX of Pravastatin 80mg  filled and also have labs drawn that was ordered at last OV. R: Pt. advised that we will contact him with Dr. Marvel Plan recommendations, if any.  12/03/10  Orders for modification of patient's antihypertensive medical rugae have been forwarded to Ms. Allyne Gee, RN.

## 2010-11-24 ENCOUNTER — Telehealth: Payer: Self-pay | Admitting: Cardiology

## 2010-11-24 NOTE — Telephone Encounter (Signed)
PT WASN'T TO KNOW IF PRAVASTATIN TO TO TAKE THE PLACE OF ANY OTHER DRUG OR IF THIS IS ADDITION TO EVERYTHING ELSE HE IS ON

## 2010-11-24 NOTE — Telephone Encounter (Signed)
Pravastatin replaced simvastatin  Pt made aware

## 2010-12-01 ENCOUNTER — Encounter: Payer: Self-pay | Admitting: Cardiology

## 2010-12-07 ENCOUNTER — Other Ambulatory Visit: Payer: Self-pay | Admitting: *Deleted

## 2010-12-07 ENCOUNTER — Encounter: Payer: Self-pay | Admitting: *Deleted

## 2010-12-07 DIAGNOSIS — I10 Essential (primary) hypertension: Secondary | ICD-10-CM

## 2010-12-07 MED ORDER — LISINOPRIL 10 MG PO TABS: 10.0000 mg | ORAL_TABLET | Freq: Every day | ORAL | Status: DC

## 2010-12-07 MED ORDER — AMLODIPINE BESYLATE 10 MG PO TABS: 10.0000 mg | ORAL_TABLET | Freq: Every day | ORAL | Status: DC

## 2010-12-07 NOTE — Telephone Encounter (Signed)
Mailed a letter to pt with all instructions per his request and labwork as well

## 2010-12-07 NOTE — Telephone Encounter (Signed)
Message copied by Teressa Lower on Tue Dec 07, 2010  4:05 PM ------      Message from: LaSalle Bing      Created: Wed Dec 01, 2010  4:39 PM       Blood pressure control is suboptimal.      Obtain results of most recent metabolic profile from Dr. Renard Matter.      Add lisinopril 10 mg q. Day      This patient has no pedal edema, increase amlodipine to 10 mg q.d.      Continue to collect home blood pressures and return a list of these values to the office in one month.

## 2010-12-07 NOTE — Progress Notes (Signed)
Discussed medicaiton changes with pt and labwork to be done

## 2010-12-07 NOTE — Assessment & Plan Note (Signed)
NAMEMarland Daniel  TONIO, SEIDER                     CHART#:  [id number]   DATE:  06/20/2007                       DOB:  Sep 13, 1945   REFERRING PHYSICIAN:  Dr. Renard Matter.   REASON FOR CONSULTATION:  Acute pancreatitis.   HISTORY OF PRESENT ILLNESS:  The patient is a 65 year old African-  American male.  Approximately 2 weeks ago he developed a severe upper  abdominal pain.  He describes it as feeling just like when I had  gallbladder problems.  He had some nausea, but denies any vomiting.  He  says that the pain has gradually improved.  He describes the pain as a  burning, cramp-like discomfort 8/10 at worst on pain scale.  It is  intermittent.  It is worse after eating.  He denies any fevers or  chills.  He denies any anorexia, except for when he was having pain.  He  was on a clear liquid diet.  He became quite weak and fatigued on the  clear liquid diet.  He has resumed his usual eating habits.  He has  noted some constipation.  He has only had about 2 bowel movements a week  since he developed this pain.  He is status post cholecystectomy in  2006.  He is unsure why he had this done.  He denies any heartburn or  indigestion.  His weight is up a few pounds.  He denies any alcohol use.  He does have a history of hyperlipidemia.  Triglycerides are 256.  LDL  150 and total cholesterol 242.  He denies any jaundice or clay-colored  stools.  He denies any new medications.   He had an elevated lipase of 168, amylase was normal.  He had a normal  CBC.  He had a glucose of 187 and otherwise normal BNP, and normal LFTs  on June 12, 2007.  He had a CT scan of the abdomen on June 13, 2007.  He was found to have diffuse fatty infiltration of the liver, a  fairly subtle finding of retroperitoneal edema adjacent to the  pancreatic head and extending inferiorly down and slightly below the  level of the transverse duodenum.  There is a slightly prominent  mesenteric node just superior to the pancreatic  neck and body.   PAST MEDICAL/SURGICAL HISTORY:  He has diabetes mellitus on insulin  since 1994, hypertension, hypercholesterolemia, BPH.  He has had  multiple colonoscopies, the last one in 2005 done at either [facility]  or the [place].  He had 4 adenomatous polyps removed on the last colonoscopy.  He has a history of cervical spondylosis status post cholecystectomy and  CABG.  Coronary artery disease with a history of CABG.   CURRENT MEDICATIONS:  1. Prazosin HCl 2 mg nightly.  2. Simvastatin 40 mg nightly  3. Aspirin 325 mg daily.  4. Atenolol 50 mg b.i.d.  5. Finasteride 5 mg daily.  6. Hydrochlorothiazide 25 mg daily.  7. Ranitidine 150 mg b.i.d.  8. Dicyclomine 20 mg t.i.d.  9. Felodipine 10 mg daily.  10.Niaspan 500 mg nightly.  11.Pantoprazole 40 mg b.i.d.  12.Hydrocodone 5/500 mg p.r.n.  13.Actos 30 mg daily.  14.Metformin 850 mg b.i.d.  15.Fish oil 2400 mg b.i.d.  16.Novolin N 50 units in the morning and 35 units in the evening.  ALLERGIES:  No known drug allergies.   FAMILY HISTORY:  There was no known family history of carcinoma of the  liver or chronic GI problems.  Mother deceased at age 74 secondary to  diabetic complications.  Father deceased at 29 secondary to pneumonia.  He has had 2 sisters with breast cancer, 2 healthy sisters.   SOCIAL HISTORY:  The patient is married.  He has 3 grown, healthy  children.  He is retired from a Systems [employer].  He has a 40-pack-year  history of tobacco use.  Denies any alcohol or drug use currently.  He  does have a history of heavy alcohol use for about 10 years, quitting in  the 1980s.   REVIEW OF SYSTEMS:  See HPI.   PHYSICAL EXAM:  VITAL SIGNS:  Weight 273 pounds, height 74 inches,  temperature 98 degrees, blood pressure 180/88, pulse 72.  GENERAL:  The patient is an obese African-American male who is alert,  pleasant, oriented, cooperative in no acute distress.  HEENT:  Pupils equal, round, and reactive to light.   Sclerae clear,  nonicteric.  Conjunctivae pink.  Oropharynx pink and moist without  lesions.  NECK:  Supple without evidence of mass or thyromegaly.  CHEST:  Heart regular rate and rhythm.  Normal S1, S2 without murmurs,  clicks, rubs, or gallops.  LUNGS:  Clear to auscultation bilaterally.  ABDOMEN:  Protuberant with positive bowel sounds x4.  No bruits  auscultated.  Soft.  Nontender, nondistended without palpable mass or  hepatosplenomegaly.  No rebound tenderness or guarding.  Exam is limited  given the patient's body habitus.  EXTREMITIES:  Without clubbing or edema bilaterally.  SKIN:  Warm and dry without any rash or jaundice.   IMPRESSION:  The patient is a 65 year old male with what appears to be  acute pancreatitis given CT and elevated lipase.  There is no history of  recent alcohol use.  He is status post cholecystectomy.  There is no  biliary ductal dilatation.  He does have a history of hyperlipidemia,  has been on Statin therapy, but I am unsure that this is high enough to  be the culprit of his acute pancreatitis.  I will discuss this case  further with Dr. Cira Servant to determine whether he is going to need further  evaluation with endoscopic ultrasound to rule out occult mass.   PLAN:  1. He has Vicodin 5/500 mg to use at home.  2. He is to follow up with Dr. Renard Matter regarding his history of      hyperlipidemia to get better cholesterol control.  3. If he develops pain again, he is going to resume a clear liquid      diet and give [place] a call or either go to the emergency room if his      pain is severe.  4. Needs EGD with Dr. Cira Servant to r/o PUD as cause of abdominal pain.  If      EGD is negative, would refer to Dr. Wendall Papa for further      evaluation with EUS.   I would like to thank Dr. Renard Matter for allowing [place] to participate in the  care of this patient.       Lorenza Burton, N.P.  Electronically Signed     Kassie Mends, M.D.  Electronically Signed     KJ/MEDQ  D:  06/20/2007  T:  06/20/2007  Job:  [id number]   cc:   Angus G. Renard Matter, MD

## 2010-12-07 NOTE — Op Note (Signed)
NAME:  Kyle Daniel, Kyle Daniel                  ACCOUNT NO.:  [id number]   MEDICAL RECORD NO.:  [id number]          PATIENT TYPE:  AMB   LOCATION:  DAY                           FACILITY:  [facility]   PHYSICIAN:  Kassie Mends, M.D.      DATE OF BIRTH:  1945-08-05   DATE OF PROCEDURE:  07/04/2007  DATE OF DISCHARGE:                               OPERATIVE REPORT   PROCEDURE:  Esophagogastroduodenoscopy with cold forceps biopsy.   HISTORY:  [name] is a 65 year old male who presented as an outpatient  with abdominal pain.  He had a CT scan in November of 2008 which showed  a fairly subtle finding of retroperitoneal edema adjacent to the  pancreatic head and extending inferiorly down and slightly below the  level of the transverse duodenum.  No enzymes were obtained.  He  presents for evaluation of this finding.   FINDINGS:  1. Normal esophagus without evidence of Barrett's, mass, erosion,      ulceration or stricture.  2. Erythema and edema of the antrum without evidence of erosion or      ulceration.  Biopsies obtained via cold forceps to evaluate for      Helicobacter pylori gastritis.  3. Erythema and irregular mucosa seen in the duodenum consistent with      duodenitis.  No ulceration appreciated.   DIAGNOSIS:  It is possible that [name] findings are associated with  duodenitis due to his history of aspirin use.   RECOMMENDATIONS:  1. He should increase his pantoprazole to twice daily for the next      month and then once a day.  He should hold his aspirin and anti-      inflammatory drugs for 30 days.  No anticoagulation for 7 days.  2. Return patient visit in 6 weeks with [name] to reassess his      abdominal pain.  3. He should avoid gastric irritants.  He was given a handout on      gastric irritants and gastritis.  4. We will call [name] with the results of his biopsies.   PROCEDURE TECHNIQUE:  Physical exam was performed.  Informed consent was  obtained from the patient  after explaining the benefits, risks and  alternatives to the procedure.  The patient was connected to the monitor  and placed in left lateral position.  Continuous oxygen was provided by  nasal cannula.  IV medicine administered through an indwelling cannula.  After administration of sedation, the patient's esophagus was intubated  and the scope was advanced under direct visualization to the second  portion of the duodenum.  The scope was removed slowly by carefully  examining the color, texture, anatomy and integrity of the mucosa on the  way out.  The patient was recovered in endoscopy and discharged home in  satisfactory condition.   ADDENDUM:  H. pylori gastritis. Needs Amoxicillin and Flagyl 500 mg BID for 10  days. Avoid gastric irritants. Continue BID PPI.      Kassie Mends, M.D.  Electronically Signed     SM/MEDQ  D:  07/04/2007  T:  07/04/2007  Job:  [id number]   cc:   Angus G. Renard Matter, MD  Fax: [phone]

## 2010-12-10 NOTE — Cardiovascular Report (Signed)
NAME:  Kyle Daniel, Kyle Daniel NO.:  [id number]   MEDICAL RECORD NO.:  [id number]                   PATIENT TYPE:  OIB   LOCATION:  2899                                 FACILITY:  [facility]   PHYSICIAN:  Vida Roller, M.D.                DATE OF BIRTH:  Aug 26, 1945   DATE OF PROCEDURE:  12/05/2002  DATE OF DISCHARGE:  12/05/2002                              CARDIAC CATHETERIZATION   PRIMARY CARE PHYSICIAN:  Angus G. Renard Matter, M.D.   CARDIOLOGIST:  Farris Has. Dorethea Clan, M.D.   REASON FOR EVALUATION:  The patient is a 65 year old African American male  with morbid obesity, hypertension, hyperlipidemia, tobacco abuse, and known  coronary artery disease, status post a heart catheterization in 1998, who  presents to see me with classic exertional angina.  He states that he can  only walk approximately a block to a block-and-a-half before he gets  significant substernal chest discomfort, which is very severe and  unrelenting until he stops walking and sits down and relaxes and then it  goes away.  It is associated with some diaphoresis.  After a discussion with  him in the office, he decided that he wanted an invasive assessment of his  coronary disease.   DESCRIPTION OF PROCEDURE:  After obtaining informed consent, the patient was  brought to the cardiac catheterization laboratory in the fasting state.  There he was prepped and draped in the usual sterile manner.  The right  wrist was anesthetized using 1% lidocaine without epinephrine.  The right  radial artery was cannulated using a 6-French 10 cm sheath and left heart  catheterization was attempted using a 6-French Judkins right #4, which  successfully cannulated the right coronary artery via the radial approach.   Unfortunately due to significant tortuosity in the subclavian artery, no  catheter was able to cannulate the left main coronary artery from the radial  approach and so the right groin was anesthetized  using 1% lidocaine without  epinephrine and the right femoral artery was cannulated using a modified  Seldinger technique. A 6-French 10 cm sheath was inserted into the right  femoral artery using a modified Seldinger technique and left heart  catheterization was performed using a 6-French Judkins left #4 and a 6-  French angled pigtail catheter which was inserted into the left ventricle  where pressure tracings were obtained.  Then a left ventriculogram was done  in the 30 degree RAO view.  The catheter was then reconnected to the  pressure tracing system and prolapsed across the aortic valve.  Catheters  were then removed.   A Radstat device was placed on the right wrist to obtain hemostasis.  There  is a small linear hematoma that runs along the median aspect of the radial  artery, which is likely secondary to failure to appropriately insert the  sheet on the first attempt at cannulation.  The hematoma was stable at the  time of discharge from the catheterization lab.  The femoral artery sheath  was removed in the cardiac holding area.  Hemostasis was obtained using  direct manual pressure.  At the conclusion of the hold, there was no  evidence of ecchymosis or hematoma formation and the distal pulses were  intact.  The total fluoroscopic time was 22.5 minutes.  The total ionized  contrast was 200 mL of Omnipaque.   RESULTS:  1. The left ventricular pressure was measured at 168/14 with an end-     diastolic pressure of 20 mmHg.  2. The aortic pressure was measured at 164/90 with a mean arterial pressure     of 120 mmHg.   SELECTIVE CORONARY ANGIOGRAPHY:  The left main is a small artery that has a  significant proximal plaque with stenosis of approximately 60%, which I feel  is hemodynamically stable.  There was mild ventricularization of the  pressure wave form upon engagement of the catheter.  The left anterior  descending coronary artery is a large artery with a 50% lesion in  its  proximal portion.  There are two large diagonals, which are significantly  diseased.  The circumflex is a moderate caliber vessel with a 50% lesion in  its mid to proximal region.  The two obtuse marginals appear to be normal  size.   The right coronary artery is a normal size dominant vessel with luminal  irregularities and a normal size posterior descending coronary artery.   The left ventriculogram reveals normal left ventricular function with no  significant wall motion abnormalities and no mitral regurgitation.  The  estimated ejection fraction is 65%.   ASSESSMENT:  This is a gentleman with hemodynamically significant left main  disease and unstable angina.  Our recommendations are to refer him to our  colleagues at [facility] for consideration for bypass surgery.                                               Vida Roller, M.D.    JH/MEDQ  D:  12/05/2002  T:  12/06/2002  Job:  [id number]

## 2010-12-10 NOTE — Op Note (Signed)
NAME:  Kyle Daniel, Kyle Daniel NO.:  [id number]   MEDICAL RECORD NO.:  [id number]                   PATIENT TYPE:  INP   LOCATION:  2307                                 FACILITY:  [facility]   PHYSICIAN:  Salvatore Decent. Cornelius Moras, M.D.              DATE OF BIRTH:  09/24/1945   DATE OF PROCEDURE:  12/18/2002  DATE OF DISCHARGE:                                 OPERATIVE REPORT   PREOPERATIVE DIAGNOSES:  Left main disease, with new onset of exertional  angina.   POSTOPERATIVE DIAGNOSES:  Left main disease, with new onset of exertional  angina.   PROCEDURE:  Median sternotomy for coronary artery bypass grafting x3 (left  internal mammary artery to the distal left anterior descending coronary  artery, left radial artery to the ramus intermediate branch, saphenous vein  graft to circumflex marginal branch).   SURGEON:  Salvatore Decent. Cornelius Moras, M.D.   ASSISTANT:  Coral Ceo, P.A.   ANESTHESIA:  General.   INDICATIONS FOR PROCEDURE:  The patient is a 65 year old male with a history  of hypertension, type 2 diabetes mellitus, hyperlipidemia, and longstanding  tobacco abuse.  The patient presents with symptoms consistent with  exertional angina.  He underwent a cardiac catheterization on Dec 05, 2002  by Dr. Vida Roller.  The findings at the time of the cardiac  catheterization were notable for left main coronary artery stenosis with  preserved left ventricular function.  A full consultation note has been  dictated previously.   OPERATIVE CONSENT:  The patient and his wife have been counseled at length,  regarding the indications and potential benefits of coronary artery bypass  grafting.  Alternative treatments strategies have been discussed.  He  understands and accepts all associated risks of surgery, including but not  limited to, risk of death, stroke, myocardial infarction, bleeding requiring  blood transfusion, arrhythmia, infection, pneumonia, respiratory failure,  acute renal failure, recurrent coronary artery disease.  All of his  questions have been addressed.  He desires to proceed with surgery as  described.   DESCRIPTION OF PROCEDURE:  The patient is brought to the operating room on  the above-mentioned date.  Central monitoring was established by the  anesthesia service, under the care and direction of Dr. Maren Beach.  Specifically, a Swan-Ganz catheter is placed through the right internal  jugular approach.  The right radial arterial line is placed.  Intravenous  antibiotics are administered.  The patient's left hand is carefully examined  and a pulse oximetry wave form is placed on the patient's left index finger.  The pulse oximetry wave form is continuously monitored, while briefly  occluding the radial artery pulse.  There is no change in the pulse oximetry  wave form.  Following this, both the radial and the ulnar pulse are  completely occluded, and the pulse oximetry wave form is noted to go flat.  On  release of the ulnar artery only, the pulse oximetry wave form returns to  normal, despite continued pressure on the radial pulse.  General endotracheal anesthesia is induced uneventfully under the care and  direction of Dr. Katrinka Blazing.  A Foley catheter is placed.  The patient's chest,  left upper extremity, abdomen, both groins, and both lower extremities are  prepared and draped in a sterile manner.  A median sternotomy incision is performed and the left internal mammary  artery is dissected from the chest wall and prepared for bypass grafting.  The left internal mammary artery is a good quality conduit.  Simultaneously  the saphenous vein is obtained from the patient's right thigh using  endoscopic vein harvest technique.  Following this, the left radial artery  is harvested through a longitudinal incision on the volar aspect of the left  forearm.  The radial artery is harvested from just above the wrist to just  below takeoff of a  large first interosseous perforating branch.  The patient  is heparinized systemically.  The radial artery is now transected just above  the wrist.  The proximal end of the conduit is inspected for hemostasis.  The proximal end of the graft is now excised and both the proximal and  distal stumps are oversewn with suture ligatures.  The radial artery conduit  is placed in a small container of the patient's own heparinized blood for  storage.  The forearm incision is closed using the standard closure with  running absorbable suture.  The skin incision is closed with a subcuticular  skin closure.  The pericardium is opened.  The ascending aorta is mildly dilated and  sclerotic.  The ascending aorta and the right atrium are cannulated for  cardiopulmonary bypass.  Adequate heparinization is verified.  Cardiopulmonary bypass is begun and the surface of the heart is inspected.  There was moderate left ventricular hypertrophy.  The distal sites are  selected for coronary artery bypass grafting.  The portion of saphenous  vein, the left radial artery and the left internal mammary artery are all  trimmed to appropriate lengths.  A temperature probe is placed in the left  ventricular septum.  A cardioplegic catheter is placed in the ascending  aorta.  The patient is allowed to cool to 32 degrees systemic temperature.  The  aortic crossclamp is applied, and the cardioplegia is delivered in an  antegrade fashion through the aortic root.  Iced saline slush is applied for  topical hypothermia.  The initial cardioplegic arrest and myocardial cooling  are felt to be excellent.  Repeat doses of cardioplegia are administered  intermittently throughout the crossclamp portion of the operation, both  through the aortic root and __________ subsequently placed vein graft to  maintain septal temperature below 15 degrees Centigrade.  The following  distal coronary anastomoses are performed: 1. The circumflex  marginal branch is grafted with a saphenous vein graft in     an end-to-side fashion.  This coronary measures 1.5 mm in diameter and is     of fair quality at the site of the distal bypass.  2. The ramus intermediate branch is grafted with the left radial artery in     an end-to-side fashion.  This coronary is inter myocardial.  It measures     2.0 mm in diameter and is of fair quality.  There is diffuse plaque     throughout the vessel.  3. The distal left anterior descending coronary artery is grafted with the  left internal mammary artery in an end-to-side fashion.  This coronary     measures 2.0 mm in diameter and is of good quality.  Both proximal     anastomoses are performed directly to the ascending aorta, prior to     removal of the aortic crossclamp.  The proximal end of the radial artery     is sewn directly to the ascending aorta.  All air is evacuated from the     aortic root.  The septal temperature is noted to rise rapidly and     dramatically upon reperfusion of the left internal mammary artery.  The     aortic crossclamp is removed after a total crossclamp time of 69 minutes.  The heart begins to beat spontaneously without need for cardioversion.  All  proximal and distal anastomoses are inspected for hemostasis, and  appropriate graft orientation.  Epicardial pacing wires are affixed to the  right ventricular outflow tract into the right atrial appendage.  The  patient is rewarmed to 37 degrees Centigrade temperature.  The patient is  weaned from cardiopulmonary bypass without difficulty.  The patient's rhythm  at separation from bypass is a normal sinus rhythm.  No inotropic support is  required.  The total cardiopulmonary bypass time for the operation is 86  minutes.  The venous and arterial cannulae are removed uneventfully.  Protamine is  administered to reverse the anticoagulation.  The mediastinum and the left  chest are irrigated with saline solution containing  vancomycin.  Meticulous  surgical hemostasis is ascertained.  The mediastinum and the left chest are  drained with three chest tubes placed through separate stab incisions  inferiorly.  The median sternotomy was closed in a routine fashion.  The  right lower extremity incision is closed in multiple layers in a routine  fashion.  All skin incisions are closed with subcuticular skin closures.  The patient tolerated the procedure well and is transported to the surgical  intensive care unit in stable condition.  There are no intraoperative  complications.  All sponge, instrument and needle counts are verified as  correct at the completion of the operation.  No blood products were  administered.                                                Salvatore Decent. Cornelius Moras, M.D.    CHO/MEDQ  D:  12/18/2002  T:  12/18/2002  Job:  [id number]   cc:   Vida Roller, M.D.  Fax: [phone]   Butch Penny, M.D.   [facility]  [place], [place]

## 2010-12-10 NOTE — Op Note (Signed)
NAME:  Kyle Daniel, Kyle Daniel NO.:  [id number]   MEDICAL RECORD NO.:  [id number]                   PATIENT TYPE:  OBV   LOCATION:  0449                                 FACILITY:  [facility]   PHYSICIAN:  Lorre Munroe., M.D.            DATE OF BIRTH:  July 17, 1946   DATE OF PROCEDURE:  06/17/2003  DATE OF DISCHARGE:                                 OPERATIVE REPORT   PREOPERATIVE DIAGNOSES:  Cholelithiasis and chronic cholecystitis.   POSTOPERATIVE DIAGNOSES:  Cholelithiasis and chronic cholecystitis.   OPERATION:  Laparoscopic cholecystectomy with operative cholangiogram.   SURGEON:  Zigmund Daniel, M.D.   ASSISTANT:  Adolph Pollack, M.D.   ANESTHESIA:  General.   DESCRIPTION OF PROCEDURE:  After the patient was monitored and anesthetized  and had routine preparation and draping of the abdomen, I infiltrated the  area just below the umbilicus with local anesthetic and made a short  transverse incision there, incised the fascia longitudinally for about 2 cm,  and then bluntly entered the peritoneal cavity.  I found no adhesions.  After placement of a 0 Vicryl pursestring suture in the fascia and securing  a Hasson cannula, I inflated the abdomen with CO2 and examined its contents.  I saw no abnormalities except that the omentum was stuck to the undersurface  of the gallbladder.  I then anesthetized three additional port sites and  placed a 10 mm epigastric port and two 5 mm mid abdominal ports on the right  side.  With the patient positioned head-up, foot-down, and tilted to the  left, I retracted the fundus of the gallbladder toward the right shoulder  and then dissected the adhesions away using cautery.  I then grasped the  infundibulum of the gallbladder and pulled it toward the right side and  dissected until I saw the cystic duct clearly emerging from the  infundibulum.  I placed a clip at the distal infundibulopelvic of the  gallbladder then  made a small hole in the cystic duct and put in a Reddick  cholangiogram catheter, secured, it with a clip, and performed a  fluoroscopic cholangiogram demonstrating a normal sized common bile duct  with free flow into the duodenum.  No evidence of filling defects, and  adequate filling of the hepatic radicles.  I then repositioned the patient  and removed the clip and the cholangiogram catheter and clipped the cystic  duct with three clips.  I then divided the cystic duct and dissected out the  cystic artery and similarly clipped and divided it.  Dissected the  gallbladder from the liver utilizing the cautery and got hemostasis with the  cautery.  I briefly irrigated the operative area and removed the irrigant.  Hemostasis was good.  There was no evidence of leakage of bile, and the  clips appeared to be secure.  I removed the gallbladder from the body  through  the umbilical  incision and tied the pursestring suture, then removed the lateral ports  under direct vision.  No bleeding occurred from the abdominal wall.  I  removed the epigastric port after allowing the CO2 to escape and closed all  skin incisions with intracuticular 4-0 Vicryl and Steri-Strips.  The patient  tolerated the operation quite well.                                               Lorre Munroe., M.D.    WB/MEDQ  D:  06/17/2003  T:  06/17/2003  Job:  [id number]   cc:   Angus G. Renard Matter, M.D.  [address]  [place]  [place] [address]  Fax: [phone]

## 2010-12-10 NOTE — Cardiovascular Report (Signed)
NAME:  Kyle Daniel, Kyle Daniel NO.:  [id number]   MEDICAL RECORD NO.:  [id number]          PATIENT TYPE:  OIB   LOCATION:  2899                         FACILITY:  [facility]   PHYSICIAN:  Salvadore Farber, M.D. LHCDATE OF BIRTH:  April 05, 1946   DATE OF PROCEDURE:  04/14/2004  DATE OF DISCHARGE:  04/14/2004                              CARDIAC CATHETERIZATION   PROCEDURE:  Left and right heart catheterization, coronary angiography,  saphenous vein graft, LIMA, and free radial graft angiography.   INDICATION:  [name] is a 65 year old gentleman status post coronary artery  bypass grafting in May 2004 who now presents with recurrent chest discomfort  with exertion.  He was referred for diagnostic angiography.   PROCEDURAL TECHNIQUE:  Informed consent was obtained.  Under 1% lidocaine  local anesthesia, a 7 French sheath was placed in the right common femoral  vein and a 6 French sheath in the right common femoral artery using the  modified Seldinger technique.  Balloon tipped catheter was passed via the  venous sheath to the pulmonary capillary wedge position.  Pressures were  measured.  Cardiac output was measured using thermodilution technique.  Left  heart catheterization and ventriculography were then performed using a  pigtail catheter.  Coronary angiography was then performed using JL-4 and JR-  4 for the native coronaries, JR-4 for the saphenous vein graft to the  marginal, left bypass catheter for the free radial to ramus and LIMA  catheter for the LIMA.  The patient tolerated the procedure well and was  transferred to the holding room in stable condition.  Sheaths will be  removed there.   COMPLICATIONS:  None.   FINDINGS:   HEMODYNAMICS:  RA 13, RV 37/11/17, PA 41/19/29, PCW 21/19/17, aorta  136/80/102, LV 140/12/20.  No aortic stenosis or mitral stenosis.   VENTRICULOGRAPHY:  1.  EF greater than 65% without regional wall motion abnormality.  No mitral  regurgitation.  2.  Left main:  60% stenosis of the mid vessel.  3.  LAD:  Moderate size vessel giving rise to a single diagonal branch.      There is a 50% stenosis of the proximal vessel.  The LIMA to LAD is      widely patent with excellent distal runoff.  The left subclavian is      without stenosis.  4.  Ramus intermedius:  70% proximal stenosis.  Widely patent free radial      graft with excellent distal runoff.  5.  Circumflex:  Moderate size vessel giving rise to a single obtuse      marginal.  There is a 60% stenosis in the proximal vessel.  The      saphenous vein graft to the marginal is widely patent with excellent      distal runoff.  6.  RCA:  Relatively small though dominant vessel.  Minor luminal      irregularities along its course.   IMPRESSION/RECOMMENDATIONS:  Widely patent bypass grafts with excellent  distal runoff.  I suspect a noncardiac etiology to his chest discomfort.  Will continue medical  therapy.       WED/MEDQ  D:  04/14/2004  T:  04/15/2004  Job:  [id number]   cc:   Angus G. Renard Matter, M.D.  [address]  [place]  [place] [address]  Fax: [phone]   Vida Roller, M.D.  Fax: [phone]

## 2010-12-10 NOTE — Discharge Summary (Signed)
NAME:  Kyle Daniel NO.:  [id number]   MEDICAL RECORD NO.:  [id number]                   PATIENT TYPE:  INP   LOCATION:  2037                                 FACILITY:  [facility]   PHYSICIAN:  Kyle Daniel, M.D.              DATE OF BIRTH:   DATE OF ADMISSION:  12/18/2002  DATE OF DISCHARGE:  12/23/2002                                 DISCHARGE SUMMARY   DISCHARGE DIAGNOSES:  1. New onset of exertional angina.  2. Severe coronary artery disease with left main disease.   SECONDARY DIAGNOSES:  1. Hypertension.  2. Type II diabetes.  3. Hyperlipidemia.  4. History of tobacco abuse.   PROCEDURE:  Dec 18, 2002 - coronary artery bypass graft surgery x 3, Dr.  Tressie Daniel, surgeon.  In this procedure, the left internal mammary artery  was connected in an end-to-side fashion to the left anterior descending  coronary artery.  The left radial artery was harvested and fashioned from  the aorta to the obtuse marginal and reverse saphenous vein graft was  fashioned from the aorta to the ramus intermedius.   DISCHARGE DISPOSITION:  Kyle Daniel is ready for discharge on  postoperative day 5.  He has maintained sinus rhythm in the postoperative  period.  He has been afebrile.  He has not required prolonged oxygen  supplementation.  He did not have acute blood loss anemia postoperatively  and has received no blood products.  His serum blood glucose has been well  controlled.  His incisions are healing nicely.  He is ambulating  independently.  He has full bowel function.  His pain is controlled with  Percocet.  He goes home on the following medications.   MEDICATIONS:  1. Percocet 5/325 one to two tablets p.o. q. 4-6h p.r.n. pain.  2. Actos 30 mg daily.  3. Glucose 850 mg p.o. b.i.d.  4. Lantus 30 units at bedtime daily.  5. Enteric coated aspirin 325 mg daily.  6. Zocor 40 mg daily, preferably at bedtime.  7. Imdur 30 mg daily for one month.  8.  Metoprolol 50 mg one-half tablet in the morning and one-half tablet in     the evening.  9. Terazosin 5 mg daily at bedtime.  10.      Avapro 300 mg daily.   DISCHARGE ACTIVITIES:  To walk daily to build up his strength.  He is asked  not to lift more than 10 pounds for the next 6 weeks.   DISCHARGE DIET:  Low sodium, low cholesterol ADA diet.   Kyle Daniel may shower.  He is to call Dr. Orvan Daniel office if his incisions  become inflamed or start to drain.  He has an office visit with Dr. Dorethea Daniel  in two weeks at [facility].  He is  to call their office to arrange the appointment  and he will see Kyle Daniel in  three weeks.  Dr. Orvan Daniel office will call with that appointment.   BRIEF HISTORY:  Kyle Daniel is a 66 year old African-American male.  He is  disabled secondary to cervical disk disease with chronic back pain.  The  patient has no previous history of coronary artery disease, although he did  undergo cardiac catheterization in 1998 with what was felt to be  insignificant coronary artery disease including 50% stenosis of the left  circumflex.  He has been treated medically since that time.  Kyle Daniel now  describes recent onset of exertional substernal chest tightness which  radiates to his neck and jaw, and is associated with dyspnea.  He reports  the pain is usually related to physical activity and is always relieved by  rest.  He occasionally has a slight twinge of pain with minimal activity or  at rest.  He denies any symptoms of chest pain occurring at night.  He does  report associated exertional dyspnea, but denies resting shortness of  breath, paroxysmal nocturnal dyspnea, orthopnea, or lower extremity edema.  He does have occasional palpitations along with dizzy spells.  He was  recently referred to Dr. Vida Daniel at [facility] in [place].  He underwent cardiac catheterization on Dec 05, 2002.  The study showed 60%  proximal  stenosis of the left main coronary artery.  There was right  dominant coronary artery circulation as well as disease involving left  anterior descending coronary artery and the left circumflex.  Ejection  fraction was estimated at 65% and the right coronary artery had  insignificant disease.   HOSPITAL COURSE:  Is as described in discharge disposition.  Kyle Daniel goes  home with medications and followup as dictated.     Kyle Daniel, P.A.                    Kyle Daniel, M.D.    GM/MEDQ  D:  12/22/2002  T:  12/22/2002  Job:  [id number]   cc:   Kyle Daniel, M.D.  Fax: [phone]   Kyle Daniel, M.D.  [address]  [place]  [place] [address]  Fax: [phone]

## 2010-12-14 ENCOUNTER — Telehealth: Payer: Self-pay | Admitting: Cardiology

## 2010-12-14 NOTE — Telephone Encounter (Signed)
PT IS ON NEW RX LISINOPRIL AND HAS JUST REMEMBERED THAT HE TRIED THIS MEDICATION IN 1990 AND CAUSED HIM TO HAVE COUGHING SPELLS, HE HAS THESE SYMPTOMS AGAIN IS THERE ANY OTHER MEDICATIONS HE CAN TRY OTHER THEN THIS.

## 2010-12-15 NOTE — Telephone Encounter (Signed)
S/B: pt was placed on lisinopril 10mg  daily at office visit on 12/07/10 A: having hacking cough from lisinopril and wishes to be placed on another mediation if necessary with less side effects please      Stated he was placed on this in 1990 with the same effect, doctor at the [place] took him off it last week bp yesterday 12/14/10  167/83 R:

## 2010-12-21 NOTE — Telephone Encounter (Signed)
DC lisinopril Losartan 100 mg q.d.

## 2010-12-23 MED ORDER — LOSARTAN POTASSIUM 100 MG PO TABS: 100.0000 mg | ORAL_TABLET | Freq: Every day | ORAL | Status: DC

## 2010-12-23 NOTE — Telephone Encounter (Signed)
Sent rx to to drug store and mailed script to pt for [place]

## 2011-02-03 ENCOUNTER — Encounter: Payer: Self-pay | Admitting: Cardiology

## 2012-05-18 ENCOUNTER — Other Ambulatory Visit (HOSPITAL_COMMUNITY): Payer: Self-pay | Admitting: Family Medicine

## 2012-05-18 ENCOUNTER — Ambulatory Visit (HOSPITAL_COMMUNITY)
Admission: RE | Admit: 2012-05-18 | Discharge: 2012-05-18 | Disposition: A | Discharge status: Home / Self Care | Payer: Medicare Other | Source: Ambulatory Visit | Attending: Family Medicine | Admitting: Family Medicine

## 2012-05-18 DIAGNOSIS — F17200 Nicotine dependence, unspecified, uncomplicated: Secondary | ICD-10-CM | POA: Insufficient documentation

## 2012-05-18 DIAGNOSIS — Z951 Presence of aortocoronary bypass graft: Secondary | ICD-10-CM | POA: Insufficient documentation

## 2013-05-21 ENCOUNTER — Ambulatory Visit (HOSPITAL_COMMUNITY)
Admission: RE | Admit: 2013-05-21 | Discharge: 2013-05-21 | Disposition: A | Discharge status: Home / Self Care | Payer: Medicare Other | Source: Ambulatory Visit | Attending: Family Medicine | Admitting: Family Medicine

## 2013-05-21 ENCOUNTER — Other Ambulatory Visit (HOSPITAL_COMMUNITY): Payer: Self-pay | Admitting: Family Medicine

## 2013-05-21 DIAGNOSIS — F17210 Nicotine dependence, cigarettes, uncomplicated: Secondary | ICD-10-CM

## 2013-05-21 DIAGNOSIS — I2510 Atherosclerotic heart disease of native coronary artery without angina pectoris: Secondary | ICD-10-CM | POA: Insufficient documentation

## 2013-05-21 DIAGNOSIS — F17200 Nicotine dependence, unspecified, uncomplicated: Secondary | ICD-10-CM | POA: Insufficient documentation

## 2013-08-13 ENCOUNTER — Ambulatory Visit (HOSPITAL_COMMUNITY)
Admission: RE | Admit: 2013-08-13 | Discharge: 2013-08-13 | Disposition: A | Discharge status: Home / Self Care | Payer: Medicare Other | Source: Ambulatory Visit | Attending: Family Medicine | Admitting: Family Medicine

## 2013-08-13 ENCOUNTER — Other Ambulatory Visit (HOSPITAL_COMMUNITY): Payer: Self-pay | Admitting: Family Medicine

## 2013-08-13 DIAGNOSIS — M545 Low back pain, unspecified: Secondary | ICD-10-CM

## 2013-08-13 DIAGNOSIS — M47817 Spondylosis without myelopathy or radiculopathy, lumbosacral region: Secondary | ICD-10-CM | POA: Insufficient documentation

## 2013-08-19 ENCOUNTER — Other Ambulatory Visit (HOSPITAL_COMMUNITY): Payer: Self-pay | Admitting: Family Medicine

## 2013-08-19 ENCOUNTER — Ambulatory Visit (HOSPITAL_COMMUNITY)
Admission: RE | Admit: 2013-08-19 | Discharge: 2013-08-19 | Disposition: A | Discharge status: Home / Self Care | Payer: Medicare Other | Source: Ambulatory Visit | Attending: Family Medicine | Admitting: Family Medicine

## 2013-08-19 DIAGNOSIS — M7989 Other specified soft tissue disorders: Secondary | ICD-10-CM

## 2013-08-19 DIAGNOSIS — M79606 Pain in leg, unspecified: Secondary | ICD-10-CM

## 2013-08-19 DIAGNOSIS — R609 Edema, unspecified: Secondary | ICD-10-CM | POA: Insufficient documentation

## 2013-08-19 DIAGNOSIS — M79609 Pain in unspecified limb: Secondary | ICD-10-CM | POA: Insufficient documentation

## 2013-09-23 ENCOUNTER — Other Ambulatory Visit: Payer: Self-pay | Admitting: Cardiovascular Disease

## 2013-09-23 ENCOUNTER — Ambulatory Visit (INDEPENDENT_AMBULATORY_CARE_PROVIDER_SITE_OTHER): Payer: Medicare Other | Admitting: Cardiovascular Disease

## 2013-09-23 ENCOUNTER — Encounter: Payer: Self-pay | Admitting: Cardiovascular Disease

## 2013-09-23 VITALS — BP 145/75 | HR 81 | Ht 73.0 in | Wt 258.0 lb

## 2013-09-23 DIAGNOSIS — R9431 Abnormal electrocardiogram [ECG] [EKG]: Secondary | ICD-10-CM

## 2013-09-23 DIAGNOSIS — I6523 Occlusion and stenosis of bilateral carotid arteries: Secondary | ICD-10-CM

## 2013-09-23 DIAGNOSIS — I10 Essential (primary) hypertension: Secondary | ICD-10-CM

## 2013-09-23 DIAGNOSIS — I25810 Atherosclerosis of coronary artery bypass graft(s) without angina pectoris: Secondary | ICD-10-CM

## 2013-09-23 DIAGNOSIS — E785 Hyperlipidemia, unspecified: Secondary | ICD-10-CM

## 2013-09-23 DIAGNOSIS — E119 Type 2 diabetes mellitus without complications: Secondary | ICD-10-CM

## 2013-09-23 DIAGNOSIS — R0602 Shortness of breath: Secondary | ICD-10-CM

## 2013-09-23 DIAGNOSIS — I658 Occlusion and stenosis of other precerebral arteries: Secondary | ICD-10-CM

## 2013-09-23 DIAGNOSIS — I6529 Occlusion and stenosis of unspecified carotid artery: Secondary | ICD-10-CM

## 2013-09-23 DIAGNOSIS — I2510 Atherosclerotic heart disease of native coronary artery without angina pectoris: Secondary | ICD-10-CM

## 2013-09-23 DIAGNOSIS — I209 Angina pectoris, unspecified: Secondary | ICD-10-CM

## 2013-09-23 NOTE — Patient Instructions (Signed)
Continue all current medications. Follow up in  3 months 

## 2013-09-23 NOTE — Progress Notes (Signed)
Patient ID: Kyle Daniel, male   DOB: 1946/06/29, 68 y.o.   MRN: [id number]      SUBJECTIVE: The patient is a 68 year old male who I am meeting for the first time. He has a history of coronary artery disease and CABG, hypertension, hyperlipidemia, diabetes mellitus, and tobacco abuse, as well as carotid artery disease. He underwent three-vessel coronary artery bypass graft surgery on 12/18/2002 with a LIMA to the LAD, left radial to the ramus intermedius, and SVG to circumflex marginal branch. He said he did not experience any chest pain, dyspnea or fatigue prior to his bypass surgery. He said his daughter, who iss a nurse, noticed that he was not appearing well and this led to coronary angiography. At the present time, he denies orthopnea, leg swelling, palpitations, and paroxysmal nocturnal dyspnea. He has had a cough and has had difficulty taking a deep breath for the past 2 weeks. He has also noticed some chills. He very seldom experiences chest pain.    No Known Allergies  Current Outpatient Prescriptions  Medication Sig Dispense Refill  . amitriptyline (ELAVIL) 25 MG tablet Take 25 mg by mouth at bedtime.      Marland Kitchen. amLODipine (NORVASC) 10 MG tablet Take 1 tablet (10 mg total) by mouth daily.  30 tablet  1  . aspirin 81 MG tablet Take 81 mg by mouth daily.        Marland Kitchen. atenolol (TENORMIN) 50 MG tablet Take 50 mg by mouth 2 (two) times daily. HOLD ATENOLOL FOR 2 WEEKS TAKE HOME BP'S; IF LESS FATIGUE AND BP OK <140/90  STOP ATENOLOL      . finasteride (PROSCAR) 5 MG tablet Take 5 mg by mouth daily.        . fish oil-omega-3 fatty acids 1000 MG capsule Take 3 g by mouth 2 (two) times daily.       . hydrochlorothiazide 25 MG tablet Take 1 tablet (25 mg total) by mouth daily.  30 tablet  3  . insulin NPH (HUMULIN N,NOVOLIN N) 100 UNIT/ML injection Inject 32 Units into the skin 2 (two) times daily.       . pioglitazone (ACTOS) 30 MG tablet Take 45 mg by mouth daily.       . pravastatin (PRAVACHOL) 80 MG  tablet Take 1 tablet (80 mg total) by mouth daily.  90 tablet  1  . prazosin (MINIPRESS) 2 MG capsule Take 2 mg by mouth at bedtime.      . ranitidine (ZANTAC) 150 MG tablet Take 1 tablet (150 mg total) by mouth 2 (two) times daily as needed for heartburn.  30 tablet  1  . traZODone (DESYREL) 50 MG tablet Take 50 mg by mouth at bedtime.         No current facility-administered medications for this visit.    Past Medical History  Diagnosis Date  . ASCVD (arteriosclerotic cardiovascular disease)     S/P CABG surgery  in 2004; cardiac cath in 2005 revealed patent grafts.  . Tobacco abuse     50 pack years; current 1 pack per day  . Hyperlipidemia   . Hypertension   . DM (diabetes mellitus)     AODM-- insulin requiring for the past 2483yrs  . BPH (benign prostatic hypertrophy)   . Carotid artery occlusion   . Cerebrovascular disease     bilateral bruits  . Hx of adenomatous colonic polyps     Past Surgical History  Procedure Laterality Date  . Coronary artery bypass graft    .  Cholecystectomy  2005  . Cervical discectomy  1996    anterior cervical spine discectomy and fusion  . Colonoscopy w/ polypectomy      4 adenomatous polyps    History   Social History  . Marital Status: Married    Spouse Name: Kyle Daniel    Number of Children: 3  . Years of Education: N/A   Occupational History  . disabled    Social History Main Topics  . Smoking status: Current Every Day Smoker -- 1.00 packs/day    Types: Cigarettes  . Smokeless tobacco: Never Used  . Alcohol Use: No     Comment: quit 1980's  . Drug Use: No  . Sexual Activity: Not on file   Other Topics Concern  . Not on file   Social History Narrative  . No narrative on file     Filed Vitals:   09/23/13 1549  BP: 145/75  Pulse: 81  Height: 6\' 1"  (1.854 m)  Weight: 258 lb (117.028 kg)    PHYSICAL EXAM General: NAD Neck: No JVD, no thyromegaly. Lungs: Clear to auscultation bilaterally with normal respiratory  effort. CV: Nondisplaced PMI.  Regular rate and rhythm, normal S1/S2, no S3/S4, no murmur. No pretibial or periankle edema.  No carotid bruit.  Normal pedal pulses.  Abdomen: Soft, nontender, no hepatosplenomegaly, no distention.  Neurologic: Alert and oriented x 3.  Psych: Normal affect. Extremities: No clubbing or cyanosis.   ECG: reviewed and available in electronic records.      ASSESSMENT AND PLAN: 1. CAD/CABG: He appears to be symptomatically stable. I will continue aspirin, beta blocker, and pravastatin. I do not see an indication for noninvasive stress testing at this time 2. HTN: elevated today. Will continue to monitor. If it remains elevated, I would consider adding hydralazine 25 mg tid. 3. Hyperlipidemia: this is reportedly checked at the [place]. I will try and obtain these results. 4. Carotid artery disease: last evaluated in 2010. Will repeat Dopplers. He will consider this.  Dispo: f/u 3 months.  Prentice Docker, M.D., F.A.C.C.

## 2013-09-27 ENCOUNTER — Other Ambulatory Visit (HOSPITAL_COMMUNITY): Payer: Self-pay | Admitting: Family Medicine

## 2013-09-27 ENCOUNTER — Ambulatory Visit (HOSPITAL_COMMUNITY)
Admission: RE | Admit: 2013-09-27 | Discharge: 2013-09-27 | Disposition: A | Discharge status: Home / Self Care | Payer: Medicare Other | Source: Ambulatory Visit | Attending: Family Medicine | Admitting: Family Medicine

## 2013-09-27 DIAGNOSIS — R0600 Dyspnea, unspecified: Secondary | ICD-10-CM

## 2013-09-27 DIAGNOSIS — R509 Fever, unspecified: Secondary | ICD-10-CM | POA: Insufficient documentation

## 2013-09-27 DIAGNOSIS — R059 Cough, unspecified: Secondary | ICD-10-CM

## 2013-09-27 DIAGNOSIS — R918 Other nonspecific abnormal finding of lung field: Secondary | ICD-10-CM | POA: Insufficient documentation

## 2013-09-27 DIAGNOSIS — R0602 Shortness of breath: Secondary | ICD-10-CM | POA: Insufficient documentation

## 2013-09-27 DIAGNOSIS — R05 Cough: Secondary | ICD-10-CM

## 2013-09-27 DIAGNOSIS — F17200 Nicotine dependence, unspecified, uncomplicated: Secondary | ICD-10-CM | POA: Insufficient documentation

## 2013-10-22 ENCOUNTER — Other Ambulatory Visit (HOSPITAL_COMMUNITY): Payer: Self-pay | Admitting: Family Medicine

## 2013-10-22 ENCOUNTER — Ambulatory Visit (HOSPITAL_COMMUNITY)
Admission: RE | Admit: 2013-10-22 | Discharge: 2013-10-22 | Disposition: A | Discharge status: Home / Self Care | Payer: Medicare Other | Source: Ambulatory Visit | Attending: Family Medicine | Admitting: Family Medicine

## 2013-10-22 DIAGNOSIS — R05 Cough: Secondary | ICD-10-CM | POA: Insufficient documentation

## 2013-10-22 DIAGNOSIS — R059 Cough, unspecified: Secondary | ICD-10-CM | POA: Insufficient documentation

## 2013-10-22 DIAGNOSIS — J189 Pneumonia, unspecified organism: Secondary | ICD-10-CM

## 2013-11-27 ENCOUNTER — Encounter: Payer: Self-pay | Admitting: Cardiovascular Disease

## 2013-11-27 ENCOUNTER — Ambulatory Visit (INDEPENDENT_AMBULATORY_CARE_PROVIDER_SITE_OTHER): Payer: Medicare Other | Admitting: Cardiovascular Disease

## 2013-11-27 VITALS — BP 131/74 | HR 66 | Ht 73.0 in | Wt 258.0 lb

## 2013-11-27 DIAGNOSIS — I658 Occlusion and stenosis of other precerebral arteries: Secondary | ICD-10-CM

## 2013-11-27 DIAGNOSIS — I6523 Occlusion and stenosis of bilateral carotid arteries: Secondary | ICD-10-CM

## 2013-11-27 DIAGNOSIS — I10 Essential (primary) hypertension: Secondary | ICD-10-CM

## 2013-11-27 DIAGNOSIS — E785 Hyperlipidemia, unspecified: Secondary | ICD-10-CM

## 2013-11-27 DIAGNOSIS — I6529 Occlusion and stenosis of unspecified carotid artery: Secondary | ICD-10-CM

## 2013-11-27 DIAGNOSIS — I25810 Atherosclerosis of coronary artery bypass graft(s) without angina pectoris: Secondary | ICD-10-CM

## 2013-11-27 NOTE — Progress Notes (Signed)
Patient ID: Kyle Daniel, male   DOB: 30-Sep-1945, 68 y.o.   MRN: [id number]      SUBJECTIVE: The patient is a 68 year old male who has a history of coronary artery disease and CABG, hypertension, hyperlipidemia, diabetes mellitus, and tobacco abuse, as well as carotid artery disease. He underwent three-vessel coronary artery bypass graft surgery on 12/18/2002 with a LIMA to the LAD, left radial to the ramus intermedius, and SVG to circumflex marginal branch.  He said he did not experience any chest pain, dyspnea or fatigue prior to his bypass surgery. He said his daughter, who is a Engineer, civil (consulting)nurse, noticed that he was not appearing well and this led to coronary angiography.   At the present time, he denies orthopnea, leg swelling, palpitations, and paroxysmal nocturnal dyspnea. He occasionally feels a "chest popping" sensation which lasts a few seconds and then spontaneously resolves. He has not had to use SL nitro. He goes to the [facility] every 3 months and was recently told "Everything looks normal"       No Known Allergies  Current Outpatient Prescriptions  Medication Sig Dispense Refill  . amitriptyline (ELAVIL) 25 MG tablet Take 25 mg by mouth at bedtime.      Marland Kitchen. amLODipine (NORVASC) 10 MG tablet Take 1 tablet (10 mg total) by mouth daily.  30 tablet  1  . aspirin 81 MG tablet Take 81 mg by mouth daily.        Marland Kitchen. atenolol (TENORMIN) 50 MG tablet Take 50 mg by mouth 2 (two) times daily.       . finasteride (PROSCAR) 5 MG tablet Take 5 mg by mouth daily.        . fish oil-omega-3 fatty acids 1000 MG capsule Take 3 g by mouth 2 (two) times daily.       . hydrochlorothiazide 25 MG tablet Take 1 tablet (25 mg total) by mouth daily.  30 tablet  3  . insulin NPH (HUMULIN N,NOVOLIN N) 100 UNIT/ML injection Inject 32 Units into the skin 2 (two) times daily.       . pioglitazone (ACTOS) 30 MG tablet Take 45 mg by mouth daily.       . pravastatin (PRAVACHOL) 80 MG tablet Take 1 tablet (80 mg total) by mouth daily.   90 tablet  1  . prazosin (MINIPRESS) 2 MG capsule Take 2 mg by mouth at bedtime.      . ranitidine (ZANTAC) 150 MG tablet Take 1 tablet (150 mg total) by mouth 2 (two) times daily as needed for heartburn.  30 tablet  1  . traZODone (DESYREL) 50 MG tablet Take 50 mg by mouth at bedtime.         No current facility-administered medications for this visit.    Past Medical History  Diagnosis Date  . ASCVD (arteriosclerotic cardiovascular disease)     S/P CABG surgery  in 2004; cardiac cath in 2005 revealed patent grafts.  . Tobacco abuse     50 pack years; current 1 pack per day  . Hyperlipidemia   . Hypertension   . DM (diabetes mellitus)     AODM-- insulin requiring for the past 6747yrs  . BPH (benign prostatic hypertrophy)   . Carotid artery occlusion   . Cerebrovascular disease     bilateral bruits  . Hx of adenomatous colonic polyps     Past Surgical History  Procedure Laterality Date  . Coronary artery bypass graft    . Cholecystectomy  2005  . Cervical discectomy  1996    anterior cervical spine discectomy and fusion  . Colonoscopy w/ polypectomy      4 adenomatous polyps    History   Social History  . Marital Status: Married    Spouse Name: Kyle Daniel    Number of Children: 3  . Years of Education: N/A   Occupational History  . disabled    Social History Main Topics  . Smoking status: Current Every Day Smoker -- 1.00 packs/day for 50 years    Types: Cigarettes    Start date: 09/05/1960  . Smokeless tobacco: Never Used  . Alcohol Use: No     Comment: quit 1980's  . Drug Use: No  . Sexual Activity: Not on file   Other Topics Concern  . Not on file   Social History Narrative  . No narrative on file     Filed Vitals:   11/27/13 1000  BP: 131/74  Pulse: 66  Height: 6\' 1"  (1.854 m)  Weight: 258 lb (117.028 kg)    PHYSICAL EXAM General: NAD Neck: No JVD, no thyromegaly. Lungs: Clear to auscultation bilaterally with normal respiratory effort. CV:  Nondisplaced PMI.  Regular rate and rhythm, normal S1/S2, no S3/S4, no murmur. No pretibial or periankle edema.  No carotid bruit.  Normal pedal pulses.  Abdomen: Soft, nontender, no hepatosplenomegaly, no distention.  Neurologic: Alert and oriented x 3.  Psych: Normal affect. Extremities: No clubbing or cyanosis.   ECG: reviewed and available in electronic records.      ASSESSMENT AND PLAN: 1. CAD/CABG: He appears to be symptomatically stable. I will continue aspirin, beta blocker, and pravastatin. I do not see an indication for noninvasive stress testing at this time. 2. HTN: Controlled today. Will continue to monitor. 3. Hyperlipidemia: This is reportedly checked at [facility]. I will try and obtain these results.  4. Carotid artery disease: Last evaluated in 2010. Will repeat Dopplers. He will consider this.   Dispo: f/u 6 months.    Kyle Daniel, M.D., F.A.C.C.

## 2013-11-27 NOTE — Patient Instructions (Signed)
Continue all current medications. Your physician wants you to follow up in: 6 months.  You will receive a reminder letter in the mail one-two months in advance.  If you don't receive a letter, please call our office to schedule the follow up appointment   

## 2014-10-23 IMAGING — US US EXTREM LOW VENOUS*L*
1 series · 14 of 24 positions shown · non-contrast
Comparison: None.

CLINICAL DATA: Left leg pain and edema, evaluate for DVT

EXAM:
LEFT LOWER EXTREMITY VENOUS DOPPLER ULTRASOUND
TECHNIQUE: Gray-scale sonography with graded compression, as well as color
Doppler and duplex ultrasound, were performed to evaluate the deep
venous system from the level of the common femoral vein through the
popliteal and proximal calf veins. Spectral Doppler was utilized to
evaluate flow at rest and with distal augmentation maneuvers.

[Series 1: us extrem low venous*left* · 0.09mm/px · 14 of 30 slices shown]
[im 1/30]
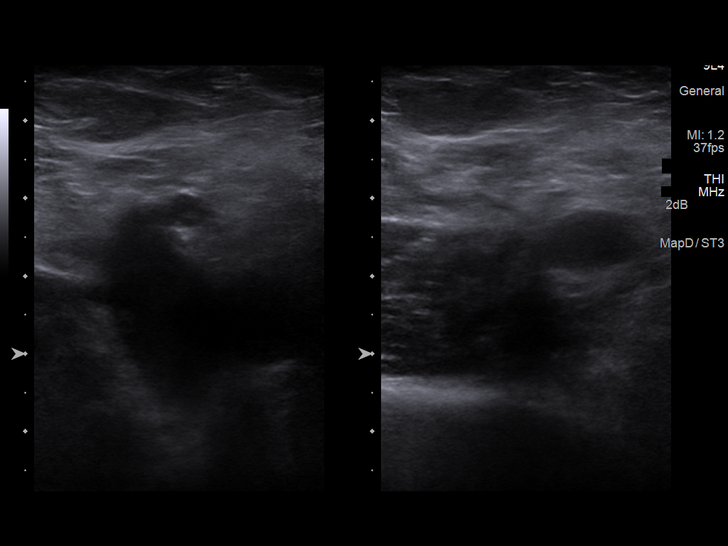
[im 3/30]
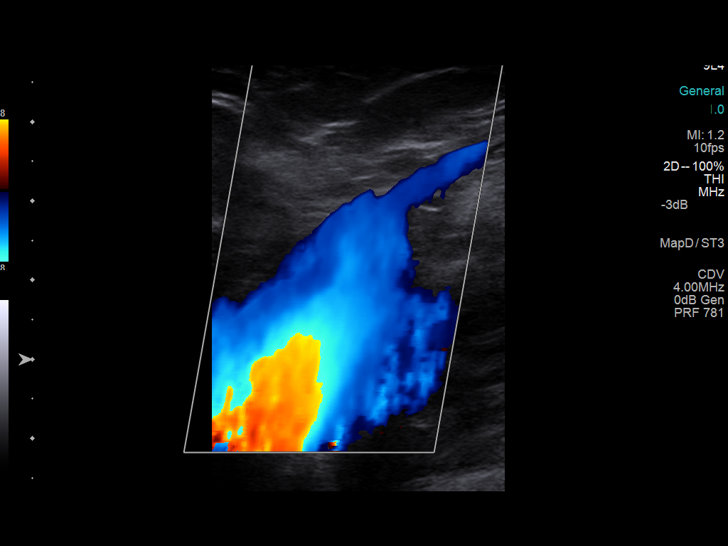
[im 6/30]
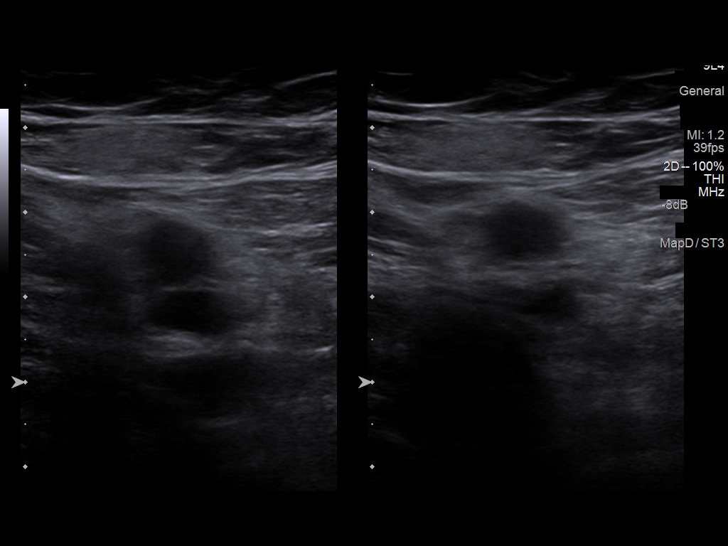
[im 8/30]
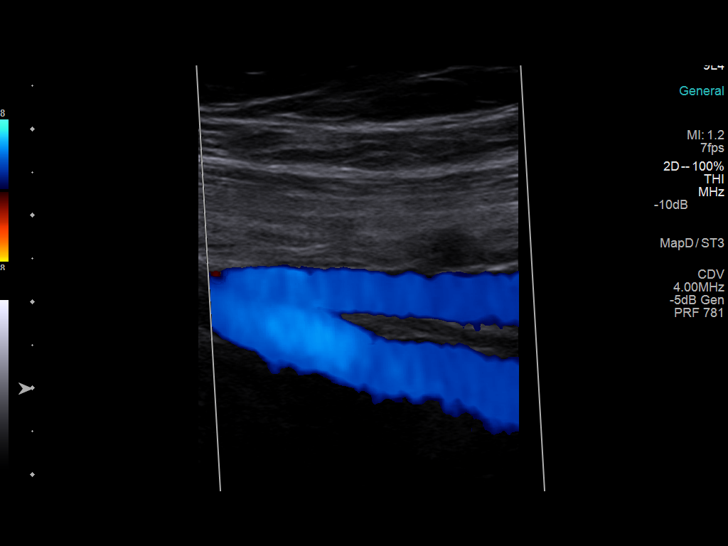
[im 9/30]
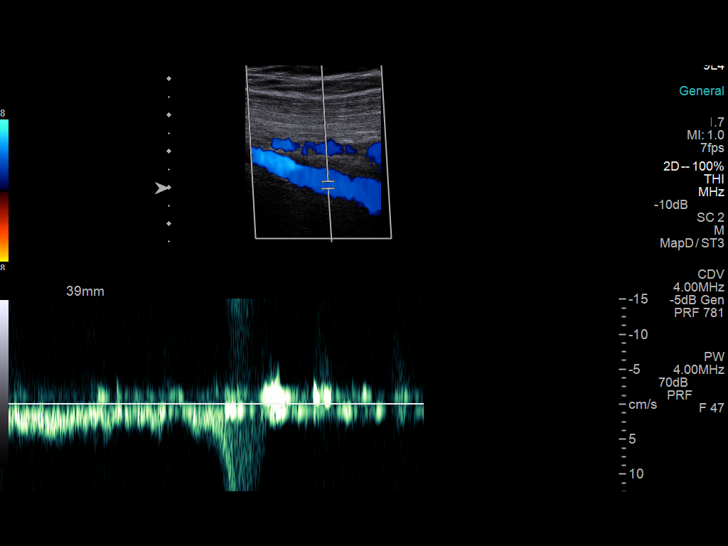
[im 12/30]
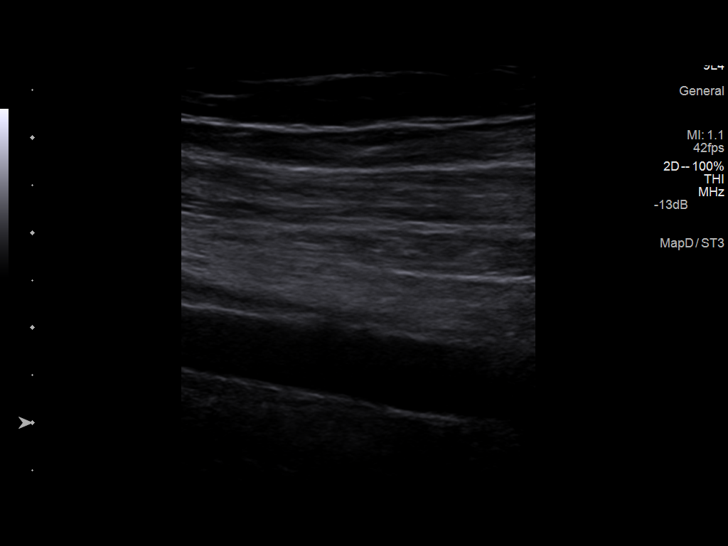
[im 14/30]
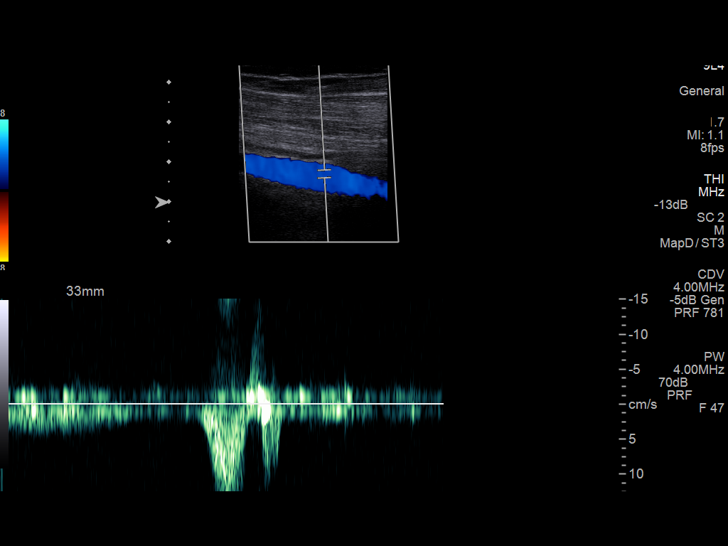
[im 16/30]
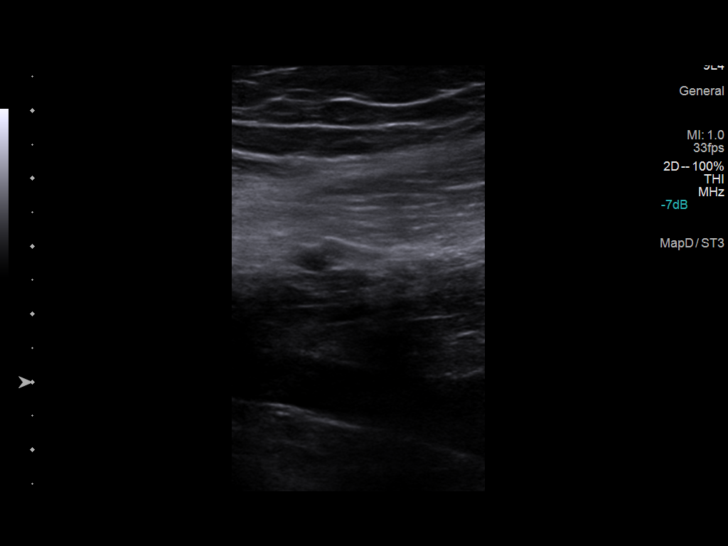
[im 18/30]
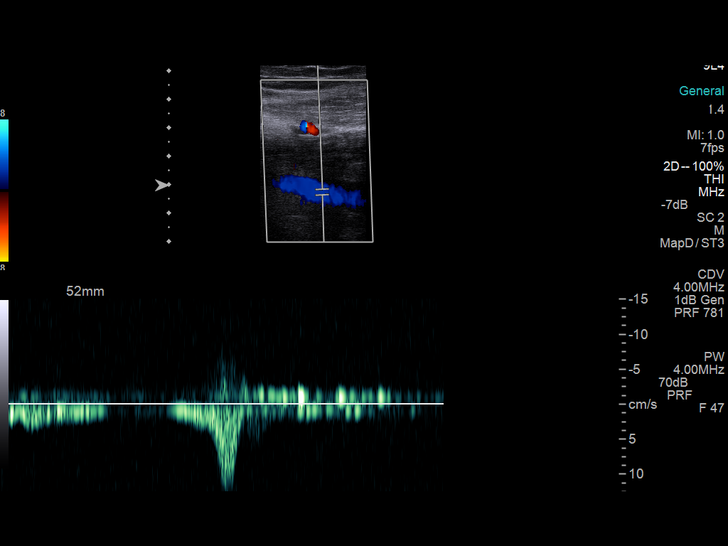
[im 21/30]
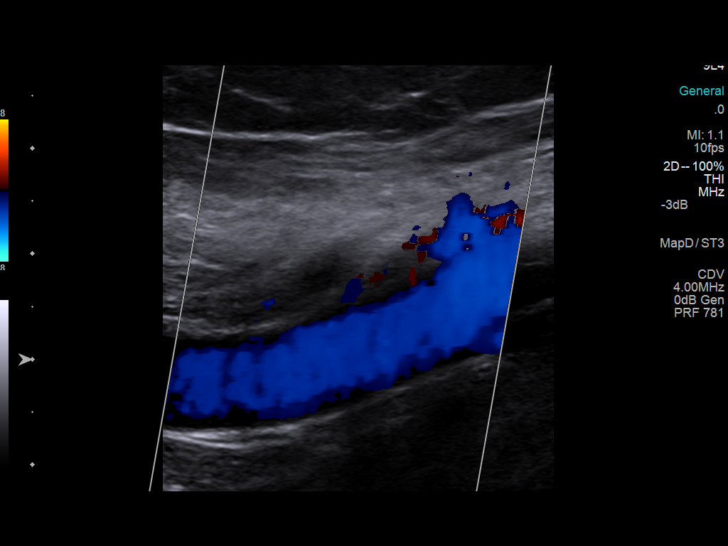
[im 23/30]
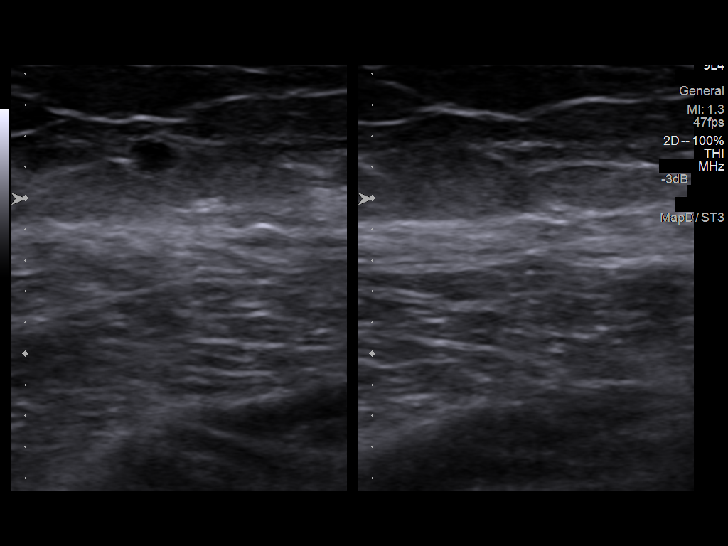
[im 24/30]
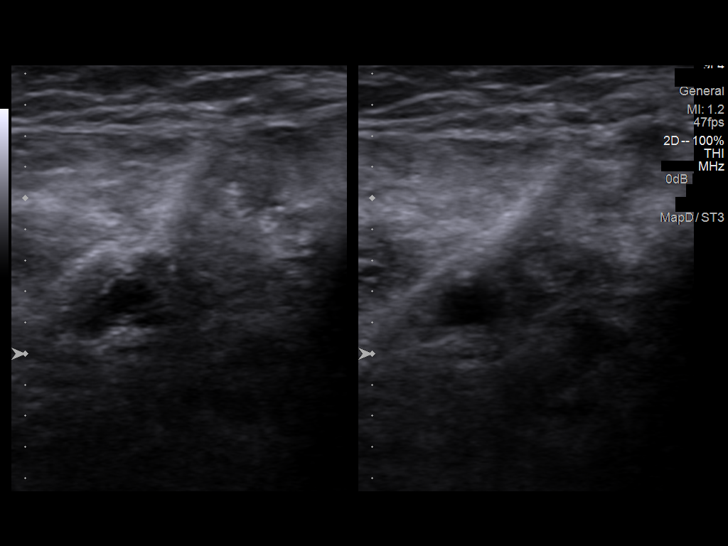
[im 27/30]
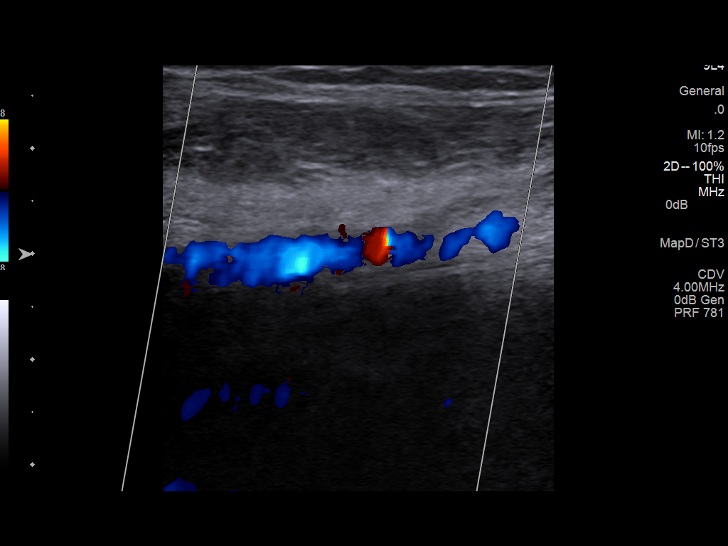
[im 30/30]
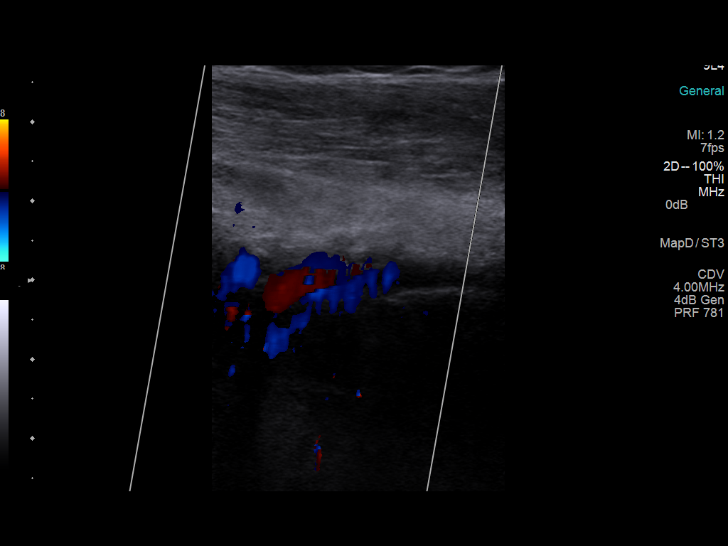

[14 of 24 positions shown; findings below may reference images not displayed]

FINDINGS: Thrombus within deep veins:  None visualized.

Compressibility of deep veins:  Normal.

Duplex waveform respiratory phasicity:  Normal.

Duplex waveform response to augmentation:  Normal.

Venous reflux:  None visualized.

Other findings:  None visualized.
IMPRESSION: No evidence of DVT within the left lower extremity.

## 2017-09-26 DIAGNOSIS — E119 Type 2 diabetes mellitus without complications: Secondary | ICD-10-CM | POA: Diagnosis not present

## 2017-09-26 DIAGNOSIS — N183 Chronic kidney disease, stage 3 (moderate): Secondary | ICD-10-CM | POA: Diagnosis not present

## 2017-09-26 DIAGNOSIS — I10 Essential (primary) hypertension: Secondary | ICD-10-CM | POA: Diagnosis not present

## 2017-09-26 DIAGNOSIS — E785 Hyperlipidemia, unspecified: Secondary | ICD-10-CM | POA: Diagnosis not present

## 2017-09-26 DIAGNOSIS — E118 Type 2 diabetes mellitus with unspecified complications: Secondary | ICD-10-CM | POA: Diagnosis not present

## 2017-09-28 DIAGNOSIS — N400 Enlarged prostate without lower urinary tract symptoms: Secondary | ICD-10-CM | POA: Diagnosis not present

## 2017-09-28 DIAGNOSIS — E119 Type 2 diabetes mellitus without complications: Secondary | ICD-10-CM | POA: Diagnosis not present

## 2017-09-28 DIAGNOSIS — Z794 Long term (current) use of insulin: Secondary | ICD-10-CM | POA: Diagnosis not present

## 2017-09-28 DIAGNOSIS — I10 Essential (primary) hypertension: Secondary | ICD-10-CM | POA: Diagnosis not present

## 2017-09-28 DIAGNOSIS — Z0000 Encounter for general adult medical examination without abnormal findings: Secondary | ICD-10-CM | POA: Diagnosis not present

## 2017-10-13 DIAGNOSIS — Z713 Dietary counseling and surveillance: Secondary | ICD-10-CM | POA: Diagnosis not present

## 2017-10-13 DIAGNOSIS — Z79899 Other long term (current) drug therapy: Secondary | ICD-10-CM | POA: Diagnosis not present

## 2017-10-13 DIAGNOSIS — I10 Essential (primary) hypertension: Secondary | ICD-10-CM | POA: Diagnosis not present

## 2018-03-05 DIAGNOSIS — R05 Cough: Secondary | ICD-10-CM | POA: Diagnosis not present

## 2018-03-05 DIAGNOSIS — E118 Type 2 diabetes mellitus with unspecified complications: Secondary | ICD-10-CM | POA: Diagnosis not present

## 2018-03-05 DIAGNOSIS — J309 Allergic rhinitis, unspecified: Secondary | ICD-10-CM | POA: Diagnosis not present

## 2018-03-05 DIAGNOSIS — Z794 Long term (current) use of insulin: Secondary | ICD-10-CM | POA: Diagnosis not present

## 2018-03-05 DIAGNOSIS — R0602 Shortness of breath: Secondary | ICD-10-CM | POA: Diagnosis not present

## 2018-06-13 DIAGNOSIS — R05 Cough: Secondary | ICD-10-CM | POA: Diagnosis not present

## 2018-06-13 DIAGNOSIS — J309 Allergic rhinitis, unspecified: Secondary | ICD-10-CM | POA: Diagnosis not present

## 2018-07-25 DIAGNOSIS — J189 Pneumonia, unspecified organism: Secondary | ICD-10-CM

## 2018-07-25 HISTORY — DX: Pneumonia, unspecified organism: J18.9

## 2018-10-03 DIAGNOSIS — R0689 Other abnormalities of breathing: Secondary | ICD-10-CM | POA: Diagnosis not present

## 2018-10-03 DIAGNOSIS — E118 Type 2 diabetes mellitus with unspecified complications: Secondary | ICD-10-CM | POA: Diagnosis not present

## 2018-10-03 DIAGNOSIS — Z794 Long term (current) use of insulin: Secondary | ICD-10-CM | POA: Diagnosis not present

## 2018-10-03 DIAGNOSIS — Z0001 Encounter for general adult medical examination with abnormal findings: Secondary | ICD-10-CM | POA: Diagnosis not present

## 2019-02-19 DIAGNOSIS — I10 Essential (primary) hypertension: Secondary | ICD-10-CM | POA: Diagnosis not present

## 2019-02-19 DIAGNOSIS — R0602 Shortness of breath: Secondary | ICD-10-CM | POA: Diagnosis not present

## 2019-02-19 DIAGNOSIS — Z1159 Encounter for screening for other viral diseases: Secondary | ICD-10-CM | POA: Diagnosis not present

## 2019-03-05 DIAGNOSIS — R0602 Shortness of breath: Secondary | ICD-10-CM | POA: Diagnosis not present

## 2019-03-05 DIAGNOSIS — J9801 Acute bronchospasm: Secondary | ICD-10-CM | POA: Diagnosis not present

## 2019-03-05 DIAGNOSIS — R062 Wheezing: Secondary | ICD-10-CM | POA: Diagnosis not present

## 2019-03-05 DIAGNOSIS — I10 Essential (primary) hypertension: Secondary | ICD-10-CM | POA: Diagnosis not present

## 2019-05-20 ENCOUNTER — Ambulatory Visit: Payer: Medicare Other | Admitting: Cardiovascular Disease

## 2019-05-20 ENCOUNTER — Other Ambulatory Visit: Payer: Self-pay

## 2019-05-20 ENCOUNTER — Encounter: Payer: Self-pay | Admitting: Cardiovascular Disease

## 2019-05-20 VITALS — BP 130/70 | HR 71 | Temp 97.3°F | Ht 73.0 in | Wt 272.0 lb

## 2019-05-20 DIAGNOSIS — E785 Hyperlipidemia, unspecified: Secondary | ICD-10-CM | POA: Diagnosis not present

## 2019-05-20 DIAGNOSIS — I25708 Atherosclerosis of coronary artery bypass graft(s), unspecified, with other forms of angina pectoris: Secondary | ICD-10-CM | POA: Diagnosis not present

## 2019-05-20 DIAGNOSIS — K922 Gastrointestinal hemorrhage, unspecified: Secondary | ICD-10-CM

## 2019-05-20 DIAGNOSIS — I10 Essential (primary) hypertension: Secondary | ICD-10-CM

## 2019-05-20 DIAGNOSIS — I779 Disorder of arteries and arterioles, unspecified: Secondary | ICD-10-CM | POA: Diagnosis not present

## 2019-05-20 NOTE — Progress Notes (Signed)
CARDIOLOGY CONSULT NOTE  Patient ID: Kyle Daniel MRN: [id number] DOB/AGE: 1946/02/18 73 y.o.  Admit date: (Not on file) Primary Physician: Pearson Grippe, MD  Reason for Consultation: CAD  HPI: Kyle Daniel is a 73 y.o. male who is being seen today for the evaluation of coronary artery disease at the request of Dr. Pearson Grippe.  I last saw him in May 2015.  He has a history of CABG, hypertension, hyperlipidemia, diabetes mellitus, and tobacco abuse, as well as carotid artery disease.   He underwent three-vessel coronary artery bypass graft surgery on 12/18/2002 with a LIMA to the LAD, left radial to the ramus intermedius, and SVG to circumflex marginal branch.  He said he did not experience any chest pain, dyspnea or fatigue prior to his bypass surgery. He said his daughter, who is a Engineer, civil (consulting), noticed that he was not appearing well and this led to coronary angiography.   He has not followed up with cardiology since that time.   He has been seeing a PA in [place] for primary care at Dr. Elmyra Ricks office.  He had one episode of chest pain last week, alleviated with drinking water.  When he overexerts he occasionally has some chest tightness. Denies shortness of breath.  2 weeks ago hospitalized at [facility] for GI bleed, Hgb 4.2. I do not have a discharge summary. He brought in some labs for my review.  Labs on 04/26/2019: Hemoglobin 4.2, white blood cells 3.8, platelets 240, TSH 2.51, sodium 143, potassium 4.1, creatinine 2, total glycerol 83, triglycerides 80, BNP 244.  He told me he was transfused 5 pints of blood.  He believes an EKG was done.  He does not believe an echocardiogram was done.  He is currently feeling well.  He continues to smoke a pack of cigarettes daily.     Allergies  Allergen Reactions  . Lisinopril Cough    Current Outpatient Medications  Medication Sig Dispense Refill  . amitriptyline (ELAVIL) 25 MG tablet Take 25 mg by mouth at bedtime.    Marland Kitchen  amLODipine (NORVASC) 10 MG tablet Take 1 tablet (10 mg total) by mouth daily. 30 tablet 1  . aspirin 81 MG tablet Take 81 mg by mouth daily.      . finasteride (PROSCAR) 5 MG tablet Take 5 mg by mouth daily.      . fish oil-omega-3 fatty acids 1000 MG capsule Take 3 g by mouth 2 (two) times daily.     Marland Kitchen gabapentin (NEURONTIN) 100 MG capsule Take 100 mg by mouth 2 (two) times daily.    . insulin NPH (HUMULIN N,NOVOLIN N) 100 UNIT/ML injection Inject 32 Units into the skin 2 (two) times daily.     . pantoprazole (PROTONIX) 20 MG tablet Take 20 mg by mouth daily.    . pravastatin (PRAVACHOL) 80 MG tablet Take 1 tablet (80 mg total) by mouth daily. 90 tablet 1  . prazosin (MINIPRESS) 2 MG capsule Take 2 mg by mouth at bedtime.    . traZODone (DESYREL) 50 MG tablet Take 50 mg by mouth at bedtime.       No current facility-administered medications for this visit.     Past Medical History:  Diagnosis Date  . ASCVD (arteriosclerotic cardiovascular disease)    S/P CABG surgery  in 2004; cardiac cath in 2005 revealed patent grafts.  Marland Kitchen BPH (benign prostatic hypertrophy)   . Carotid artery occlusion   . Cerebrovascular disease    bilateral bruits  .  DM (diabetes mellitus) ([place])    AODM-- insulin requiring for the past 54yrs  . Hx of adenomatous colonic polyps   . Hyperlipidemia   . Hypertension   . Tobacco abuse    50 pack years; current 1 pack per day    Past Surgical History:  Procedure Laterality Date  . CERVICAL DISCECTOMY  1996   anterior cervical spine discectomy and fusion  . CHOLECYSTECTOMY  2005  . COLONOSCOPY W/ POLYPECTOMY     4 adenomatous polyps  . CORONARY ARTERY BYPASS GRAFT      Social History   Socioeconomic History  . Marital status: Married    Spouse name: valeria  . Number of children: 3  . Years of education: Not on file  . Highest education level: Not on file  Occupational History  . Occupation: disabled  Social Needs  . Financial resource strain: Not on  file  . Food insecurity    Worry: Not on file    Inability: Not on file  . Transportation needs    Medical: Not on file    Non-medical: Not on file  Tobacco Use  . Smoking status: Current Every Day Smoker    Packs/day: 1.00    Years: 50.00    Pack years: 50.00    Types: Cigarettes    Start date: 09/05/1960  . Smokeless tobacco: Never Used  Substance and Sexual Activity  . Alcohol use: No    Comment: quit 1980's  . Drug use: No  . Sexual activity: Not on file  Lifestyle  . Physical activity    Days per week: Not on file    Minutes per session: Not on file  . Stress: Not on file  Relationships  . Social Herbalist on phone: Not on file    Gets together: Not on file    Attends religious service: Not on file    Active member of club or organization: Not on file    Attends meetings of clubs or organizations: Not on file    Relationship status: Not on file  . Intimate partner violence    Fear of current or ex partner: Not on file    Emotionally abused: Not on file    Physically abused: Not on file    Forced sexual activity: Not on file  Other Topics Concern  . Not on file  Social History Narrative  . Not on file     No family history of premature CAD in 1st degree relatives.  Current Meds  Medication Sig  . amitriptyline (ELAVIL) 25 MG tablet Take 25 mg by mouth at bedtime.  Marland Kitchen amLODipine (NORVASC) 10 MG tablet Take 1 tablet (10 mg total) by mouth daily.  Marland Kitchen aspirin 81 MG tablet Take 81 mg by mouth daily.    . finasteride (PROSCAR) 5 MG tablet Take 5 mg by mouth daily.    . fish oil-omega-3 fatty acids 1000 MG capsule Take 3 g by mouth 2 (two) times daily.   Marland Kitchen gabapentin (NEURONTIN) 100 MG capsule Take 100 mg by mouth 2 (two) times daily.  . insulin NPH (HUMULIN N,NOVOLIN N) 100 UNIT/ML injection Inject 32 Units into the skin 2 (two) times daily.   . pantoprazole (PROTONIX) 20 MG tablet Take 20 mg by mouth daily.  . pravastatin (PRAVACHOL) 80 MG tablet Take 1  tablet (80 mg total) by mouth daily.  . prazosin (MINIPRESS) 2 MG capsule Take 2 mg by mouth at bedtime.  . traZODone (DESYREL)  50 MG tablet Take 50 mg by mouth at bedtime.        Review of systems complete and found to be negative unless listed above in HPI    Physical exam Blood pressure 130/70, pulse 71, temperature (!) 97.3 F (36.3 C), temperature source Temporal, height 6\' 1"  (1.854 m), weight 272 lb (123.4 kg). General: NAD Neck: No JVD, no thyromegaly or thyroid nodule.  Lungs: Clear to auscultation bilaterally with normal respiratory effort. CV: Nondisplaced PMI. Regular rate and rhythm, normal S1/S2, no S3/S4, no murmur.  No peripheral edema.  No carotid bruit.    Abdomen: Soft, nontender, no distention.  Skin: Intact without lesions or rashes.  Neurologic: Alert and oriented x 3.  Psych: Normal affect. Extremities: No clubbing or cyanosis.  HEENT: Normal.   ECG: Most recent ECG reviewed.   Labs: No results found for: K, BUN, CREATININE, ALT, TSH, HGB   Lipids: No results found for: LDLCALC, LDLDIRECT, CHOL, TRIG, HDL      ASSESSMENT AND PLAN:  1.  Coronary artery disease: He underwent three-vessel coronary artery bypass graft surgery on 12/18/2002 with a LIMA to the LAD, left radial to the ramus intermedius, and SVG to circumflex marginal branch.  He currently denies any significant anginal symptoms.  Currently on aspirin and pravastatin.  As it has been several years since CABG, I will obtain a Lexiscan to evaluate for any significant ischemic territories. I will order a 2-D echocardiogram with Doppler to evaluate cardiac structure, function, and regional wall motion.  2.  Hypertension: Blood pressure is normal.  No changes to therapy.  3. Hyperlipidemia: Lipids reviewed above.  Continue pravastatin.  4. Carotid artery disease: Last evaluated in 2010.  I will obtain carotid Dopplers.  5.  GI bleed: He was apparently transfused 5 units.  [facility].  He was discharged on aspirin 81 mg.  Disposition: Follow up in 6 months  Signed: Prentice DockerSuresh Koneswaran, M.D., F.A.C.C.  05/20/2019, 1:40 PM

## 2019-05-20 NOTE — Addendum Note (Signed)
Addended by: Barbarann Ehlers A on: 05/20/2019 02:26 PM   Modules accepted: Orders

## 2019-05-20 NOTE — Patient Instructions (Addendum)
Medication Instructions:  Your physician recommends that you continue on your current medications as directed. Please refer to the Current Medication list given to you today.  *If you need a refill on your cardiac medications before your next appointment, please call your pharmacy*  Lab Work: None today If you have labs (blood work) drawn today and your tests are completely normal, you will receive your results only by: Marland Kitchen MyChart Message (if you have MyChart) OR . A paper copy in the mail If you have any lab test that is abnormal or we need to change your treatment, we will call you to review the results.  Testing/Procedures: Your physician has requested that you have an echocardiogram. Echocardiography is a painless test that uses sound waves to create images of your heart. It provides your doctor with information about the size and shape of your heart and how well your heart's chambers and valves are working. This procedure takes approximately one hour. There are no restrictions for this procedure.   Your physician has requested that you have a carotid duplex. This test is an ultrasound of the carotid arteries in your neck. It looks at blood flow through these arteries that supply the brain with blood. Allow one hour for this exam. There are no restrictions or special instructions.  Your physician has requested that you have a lexiscan myoview. For further information please visit [email or web address]. Please follow instruction sheet, as given.    Follow-Up: At [facility], you and your health needs are our priority.  As part of our continuing mission to provide you with exceptional heart care, we have created designated Provider Care Teams.  These Care Teams include your primary Cardiologist (physician) and Advanced Practice Providers (APPs -  Physician Assistants and Nurse Practitioners) who all work together to provide you with the care you need, when you need it.  Your next  appointment:   6 months  The format for your next appointment:   In Person  Provider:   Kate Sable, MD  Other Instructions None

## 2019-05-20 NOTE — Addendum Note (Signed)
Addended by: Barbarann Ehlers A on: 05/20/2019 02:28 PM   Modules accepted: Orders

## 2019-05-29 DIAGNOSIS — E119 Type 2 diabetes mellitus without complications: Secondary | ICD-10-CM | POA: Diagnosis not present

## 2019-05-29 DIAGNOSIS — Z0100 Encounter for examination of eyes and vision without abnormal findings: Secondary | ICD-10-CM | POA: Diagnosis not present

## 2019-05-31 ENCOUNTER — Other Ambulatory Visit: Payer: Self-pay

## 2019-05-31 ENCOUNTER — Encounter (HOSPITAL_COMMUNITY): Payer: Self-pay

## 2019-05-31 ENCOUNTER — Encounter (HOSPITAL_COMMUNITY)
Admission: RE | Admit: 2019-05-31 | Discharge: 2019-05-31 | Disposition: A | Discharge status: Home / Self Care | Payer: Medicare Other | Source: Ambulatory Visit | Attending: Cardiovascular Disease | Admitting: Cardiovascular Disease

## 2019-05-31 ENCOUNTER — Ambulatory Visit (HOSPITAL_COMMUNITY)
Admission: RE | Admit: 2019-05-31 | Discharge: 2019-05-31 | Disposition: A | Discharge status: Home / Self Care | Payer: Medicare Other | Source: Ambulatory Visit | Attending: Cardiovascular Disease | Admitting: Cardiovascular Disease

## 2019-05-31 ENCOUNTER — Encounter (HOSPITAL_BASED_OUTPATIENT_CLINIC_OR_DEPARTMENT_OTHER)
Admission: RE | Admit: 2019-05-31 | Discharge: 2019-05-31 | Disposition: A | Discharge status: Home / Self Care | Payer: Medicare Other | Source: Ambulatory Visit | Attending: Cardiovascular Disease | Admitting: Cardiovascular Disease

## 2019-05-31 DIAGNOSIS — E785 Hyperlipidemia, unspecified: Secondary | ICD-10-CM

## 2019-05-31 DIAGNOSIS — I25708 Atherosclerosis of coronary artery bypass graft(s), unspecified, with other forms of angina pectoris: Secondary | ICD-10-CM | POA: Insufficient documentation

## 2019-05-31 DIAGNOSIS — I6523 Occlusion and stenosis of bilateral carotid arteries: Secondary | ICD-10-CM | POA: Diagnosis not present

## 2019-05-31 DIAGNOSIS — I779 Disorder of arteries and arterioles, unspecified: Secondary | ICD-10-CM | POA: Insufficient documentation

## 2019-05-31 DIAGNOSIS — I10 Essential (primary) hypertension: Secondary | ICD-10-CM | POA: Diagnosis not present

## 2019-05-31 LAB — NM MYOCAR MULTI W/SPECT W/WALL MOTION / EF
LV dias vol: 124 mL (ref 62–150)
LV sys vol: 59 mL
Peak HR: 108 {beats}/min
RATE: 0.4
Rest HR: 75 {beats}/min
SDS: 4
SRS: 1
SSS: 5
TID: 0.96

## 2019-05-31 MED ORDER — TECHNETIUM TC 99M TETROFOSMIN IV KIT
10.0000 | PACK | Freq: Once | INTRAVENOUS | Status: AC | PRN
  Administered 2019-05-31: 10.1 via INTRAVENOUS

## 2019-05-31 MED ORDER — REGADENOSON 0.4 MG/5ML IV SOLN
INTRAVENOUS | Status: AC
  Administered 2019-05-31: 5 mL via INTRAVENOUS
  Filled 2019-05-31: qty 5

## 2019-05-31 MED ORDER — SODIUM CHLORIDE FLUSH 0.9 % IV SOLN
INTRAVENOUS | Status: AC
  Administered 2019-05-31: 10 mL via INTRAVENOUS
  Filled 2019-05-31: qty 10

## 2019-05-31 MED ORDER — TECHNETIUM TC 99M TETROFOSMIN IV KIT
30.0000 | PACK | Freq: Once | INTRAVENOUS | Status: AC | PRN
  Administered 2019-05-31: 29 via INTRAVENOUS

## 2019-05-31 NOTE — Progress Notes (Signed)
*  PRELIMINARY RESULTS* Echocardiogram 2D Echocardiogram has been performed.  Kyle Daniel 05/31/2019, 11:53 AM

## 2019-06-03 ENCOUNTER — Telehealth: Payer: Self-pay | Admitting: Cardiovascular Disease

## 2019-06-03 NOTE — Telephone Encounter (Signed)
Pt made aware of test results, voiced understanding.

## 2019-06-03 NOTE — Telephone Encounter (Signed)
Returning someones call 

## 2019-06-03 NOTE — Telephone Encounter (Signed)
Per Daughter  Call cell if you cant reach him at [phone]

## 2019-06-17 ENCOUNTER — Telehealth: Payer: Self-pay | Admitting: Cardiovascular Disease

## 2019-06-17 NOTE — Telephone Encounter (Signed)
Pt's daughter called requesting that we call the pt w/ his lab results

## 2019-06-17 NOTE — Telephone Encounter (Signed)
Called pt. Informed him that Dr. Bronson Ing did not geT get hemoglobin on him. He will contact pcp.

## 2019-06-18 DIAGNOSIS — E119 Type 2 diabetes mellitus without complications: Secondary | ICD-10-CM | POA: Diagnosis not present

## 2019-06-18 DIAGNOSIS — I10 Essential (primary) hypertension: Secondary | ICD-10-CM | POA: Diagnosis not present

## 2019-06-18 DIAGNOSIS — D649 Anemia, unspecified: Secondary | ICD-10-CM | POA: Diagnosis not present

## 2019-06-18 DIAGNOSIS — Z79899 Other long term (current) drug therapy: Secondary | ICD-10-CM | POA: Diagnosis not present

## 2019-09-12 ENCOUNTER — Ambulatory Visit: Payer: Medicare Other | Admitting: Cardiovascular Disease

## 2019-09-12 ENCOUNTER — Telehealth (INDEPENDENT_AMBULATORY_CARE_PROVIDER_SITE_OTHER): Payer: Medicare Other | Admitting: Cardiovascular Disease

## 2019-09-12 ENCOUNTER — Encounter: Payer: Self-pay | Admitting: Cardiovascular Disease

## 2019-09-12 VITALS — BP 152/64 | HR 84 | Ht 73.0 in | Wt 275.0 lb

## 2019-09-12 DIAGNOSIS — K922 Gastrointestinal hemorrhage, unspecified: Secondary | ICD-10-CM

## 2019-09-12 DIAGNOSIS — I10 Essential (primary) hypertension: Secondary | ICD-10-CM

## 2019-09-12 DIAGNOSIS — I25708 Atherosclerosis of coronary artery bypass graft(s), unspecified, with other forms of angina pectoris: Secondary | ICD-10-CM

## 2019-09-12 DIAGNOSIS — E785 Hyperlipidemia, unspecified: Secondary | ICD-10-CM

## 2019-09-12 DIAGNOSIS — I779 Disorder of arteries and arterioles, unspecified: Secondary | ICD-10-CM

## 2019-09-12 DIAGNOSIS — Z8719 Personal history of other diseases of the digestive system: Secondary | ICD-10-CM

## 2019-09-12 MED ORDER — NITROGLYCERIN 0.4 MG SL SUBL: 0.4000 mg | SUBLINGUAL_TABLET | SUBLINGUAL | 3 refills | Status: AC | PRN

## 2019-09-12 NOTE — Addendum Note (Signed)
Addended by: Lesle Chris on: 09/12/2019 02:14 PM   Modules accepted: Orders

## 2019-09-12 NOTE — Patient Instructions (Signed)
Medication Instructions:   Nitroglycerin as needed for severe chest pain only.  New prescription sent to pharmacy today.  Continue all other medications.    Labwork: none  Testing/Procedures: none  Follow-Up: 6 months   Any Other Special Instructions Will Be Listed Below (If Applicable).  If you need a refill on your cardiac medications before your next appointment, please call your pharmacy.

## 2019-09-12 NOTE — Progress Notes (Signed)
Virtual Visit via Telephone Note   This visit type was conducted due to national recommendations for restrictions regarding the COVID-19 Pandemic (e.g. social distancing) in an effort to limit this patient's exposure and mitigate transmission in our community.  Due to his co-morbid illnesses, this patient is at least at moderate risk for complications without adequate follow up.  This format is felt to be most appropriate for this patient at this time.  The patient did not have access to video technology/had technical difficulties with video requiring transitioning to audio format only (telephone).  All issues noted in this document were discussed and addressed.  No physical exam could be performed with this format.  Please refer to the patient's chart for his  consent to telehealth for [facility].   Date:  09/12/2019   ID:  Kyle Daniel, DOB [birth year], MRN [id number]  Patient Location: Home Provider Location: Office  PCP:  Pearson Grippe, MD  Cardiologist:  Prentice Docker, MD  Electrophysiologist:  None   Evaluation Performed:  Follow-Up Visit  Chief Complaint:  CAD  History of Present Illness:    Kyle Daniel is a 74 y.o. male with a history of CABG, hypertension, hyperlipidemia, diabetes mellitus, GI bleed, tobacco abuse, as well as carotid artery disease.   He underwent three-vessel coronary artery bypass graft surgery on 12/18/2002 with a LIMA to the LAD, left radial to the ramus intermedius, and SVG to circumflex marginal branch.   He continues to smoke a pack of cigarettes daily.  He has diffuse arthritic pains on occurrence.  He seldom has chest pains and they are primarily on the right side. It can occur after eating on occasion. He denies dysphagia for solids and nausea and vomiting.  He denies shortness of breath and leg swelling.  He denies hematochezia and melena.   Past Medical History:  Diagnosis Date  . ASCVD (arteriosclerotic cardiovascular disease)    S/P  CABG surgery  in 2004; cardiac cath in 2005 revealed patent grafts.  Marland Kitchen BPH (benign prostatic hypertrophy)   . Carotid artery occlusion   . Cerebrovascular disease    bilateral bruits  . DM (diabetes mellitus) (HCC)    AODM-- insulin requiring for the past 58yrs  . Hx of adenomatous colonic polyps   . Hyperlipidemia   . Hypertension   . Tobacco abuse    50 pack years; current 1 pack per day   Past Surgical History:  Procedure Laterality Date  . CERVICAL DISCECTOMY  1996   anterior cervical spine discectomy and fusion  . CHOLECYSTECTOMY  2005  . COLONOSCOPY W/ POLYPECTOMY     4 adenomatous polyps  . CORONARY ARTERY BYPASS GRAFT       Current Meds  Medication Sig  . amitriptyline (ELAVIL) 25 MG tablet Take 25 mg by mouth at bedtime.  Marland Kitchen amLODipine (NORVASC) 10 MG tablet Take 1 tablet (10 mg total) by mouth daily.  Marland Kitchen aspirin 81 MG tablet Take 81 mg by mouth daily.    . finasteride (PROSCAR) 5 MG tablet Take 5 mg by mouth daily.    . fish oil-omega-3 fatty acids 1000 MG capsule Take 3 g by mouth 2 (two) times daily.   Marland Kitchen gabapentin (NEURONTIN) 100 MG capsule Take 200 mg by mouth 2 (two) times daily.   . insulin NPH (HUMULIN N,NOVOLIN N) 100 UNIT/ML injection Inject 32 Units into the skin 2 (two) times daily.   . pantoprazole (PROTONIX) 20 MG tablet Take 20 mg by mouth daily.  . pravastatin (  PRAVACHOL) 80 MG tablet Take 1 tablet (80 mg total) by mouth daily.  . prazosin (MINIPRESS) 2 MG capsule Take 2 mg by mouth at bedtime.  . traZODone (DESYREL) 50 MG tablet Take 50 mg by mouth at bedtime.       Allergies:   Lisinopril   Social History   Tobacco Use  . Smoking status: Current Every Day Smoker    Packs/day: 1.50    Years: 50.00    Pack years: 75.00    Types: Cigarettes    Start date: 09/05/1960  . Smokeless tobacco: Never Used  Substance Use Topics  . Alcohol use: No    Comment: quit 1980's  . Drug use: No     Family Hx: The patient's family history includes Cancer in  his sister and sister; Pneumonia (age of onset: 62) in his mother; Pneumonia (age of onset: 64) in his father.  ROS:   Please see the history of present illness.     All other systems reviewed and are negative.   Prior CV studies:   The following studies were reviewed today:  Nuclear stress test 05/31/2019:   No diagnostic ST segment changes to indicate ischemia.  Small, mild intensity, apical anterior and mid to basal inferoseptal defects that are reversible and consistent with ischemia in 2 vascular territories.  This is an intermediate risk study.  Nuclear stress EF: 52%.     Carotid Dopplers 05/31/2019:  IMPRESSION: Right:  Heterogeneous and partially calcified plaque at the right carotid bifurcation, with discordant results regarding degree of stenosis by established duplex criteria. Peak velocity suggests less than 50% stenosis, with the ICA/ CCA ratio suggesting a lesser degree of stenosis. Note that flow velocities of the right ICA were obtained from an area distal to the maximum narrowing due to the presence of anterior wall plaque with shadowing and may be underestimating the percentage of ICA stenosis. if establishing a more accurate degree of stenosis is required, cerebral angiogram should be considered, or as a second best test, CTA.  Left:  Heterogeneous and partially calcified plaque at the left carotid bifurcation, with discordant results regarding degree of stenosis by established duplex criteria. Peak velocity suggests 50%-69% stenosis, with the ICA/ CCA ratio suggesting a lesser degree of stenosis. Note that the flow velocities of the left ICA were obtained from an area distal to the maximum narrowing due to the presence of anterior wall plaque with shadowing and may be underestimating the percentage of ICA stenosis. If establishing a more accurate degree of stenosis is required, cerebral angiogram should be considered, or as a second best test,  CTA.   Echocardiogram 05/31/2019:  1. Left ventricular ejection fraction, by visual estimation, is 60 to  65%. The left ventricle has normal function. There is moderately increased  left ventricular hypertrophy.  2. Left ventricular diastolic parameters are consistent with Grade II  diastolic dysfunction (pseudonormalization).  3. Global right ventricle has normal systolic function.The right  ventricular size is normal. No increase in right ventricular wall  thickness.  4. Left atrial size was mildly dilated.  5. Right atrial size was normal.  6. Presence of pericardial fat pad.  7. Mild aortic valve annular calcification.  8. Mild to moderate mitral annular calcification.  9. The mitral valve is grossly normal. Trace mitral valve regurgitation.  10. The tricuspid valve is grossly normal. Tricuspid valve regurgitation  is trivial.  11. The aortic valve is tricuspid. Aortic valve regurgitation is not  visualized.  12. The pulmonic  valve was grossly normal. Pulmonic valve regurgitation is  trivial.  13. TR signal is inadequate for assessing pulmonary artery systolic  pressure.  14. The inferior vena cava is normal in size with greater than 50%  respiratory variability, suggesting right atrial pressure of 3 mmHg.   Labs/Other Tests and Data Reviewed:    EKG:  No ECG reviewed.  Recent Labs: No results found for requested labs within last 8760 hours.   Recent Lipid Panel No results found for: CHOL, TRIG, HDL, CHOLHDL, LDLCALC, LDLDIRECT  Wt Readings from Last 3 Encounters:  09/12/19 275 lb (124.7 kg)  05/20/19 272 lb (123.4 kg)  11/27/13 258 lb (117 kg)     Objective:    Vital Signs:  BP (!) 152/64   Pulse 84   Ht 6\' 1"  (1.854 m)   Wt 275 lb (124.7 kg)   BMI 36.28 kg/m    VITAL SIGNS:  reviewed  ASSESSMENT & PLAN:    1.  Coronary artery disease: He underwent three-vessel coronary artery bypass graft surgery on 12/18/2002 with a LIMA to the LAD, left  radial to the ramus intermedius, and SVG to circumflex marginal branch.  He currently denies any significant anginal symptoms.  Nuclear stress test in November 2019 was intermediate risk with ischemia noted. Currently on aspirin and pravastatin.  I will continue with medical management for now.  I will prescribe sublingual nitroglycerin.  2.  Hypertension: Blood pressure is elevated. He said he was rushing around. This will need continued monitoring.  3. Hyperlipidemia: LDL 39 and triglycerides 80 on 04/26/2019. Continue pravastatin.  4. Carotid artery disease: Carotid Dopplers from November 2020 reviewed above with less than 50% right internal carotid artery stenosis and 50 to 69% left internal carotid artery stenosis.  Continue statin therapy.  I will obtain follow-up Dopplers in November 2021.  5. History of GI bleed: He was treated for a GI bleed at the [facility] in October 2020.  He has had no recurrence.  Continue aspirin.    COVID-19 Education: The signs and symptoms of COVID-19 were discussed with the patient and how to seek care for testing (follow up with PCP or arrange E-visit).  The importance of social distancing was discussed today.  Time:   Today, I have spent 25 minutes with the patient with telehealth technology discussing the above problems.     Medication Adjustments/Labs and Tests Ordered: Current medicines are reviewed at length with the patient today.  Concerns regarding medicines are outlined above.   Tests Ordered: No orders of the defined types were placed in this encounter.   Medication Changes: No orders of the defined types were placed in this encounter.   Follow Up:  In Person in 6 month(s)  Signed, Kate Sable, MD  09/12/2019 1:21 PM    [facility]

## 2019-10-08 DIAGNOSIS — Z0001 Encounter for general adult medical examination with abnormal findings: Secondary | ICD-10-CM | POA: Diagnosis not present

## 2019-10-08 DIAGNOSIS — G4701 Insomnia due to medical condition: Secondary | ICD-10-CM | POA: Diagnosis not present

## 2019-10-08 DIAGNOSIS — E785 Hyperlipidemia, unspecified: Secondary | ICD-10-CM | POA: Diagnosis not present

## 2019-10-08 DIAGNOSIS — Z794 Long term (current) use of insulin: Secondary | ICD-10-CM | POA: Diagnosis not present

## 2019-11-04 ENCOUNTER — Encounter: Payer: Self-pay | Admitting: Nurse Practitioner

## 2019-11-14 ENCOUNTER — Ambulatory Visit: Payer: Medicare Other | Admitting: Nurse Practitioner

## 2020-01-27 ENCOUNTER — Other Ambulatory Visit: Payer: Self-pay

## 2020-01-27 ENCOUNTER — Emergency Department (HOSPITAL_COMMUNITY): Payer: Medicare Other

## 2020-01-27 ENCOUNTER — Encounter (HOSPITAL_COMMUNITY): Payer: Self-pay | Admitting: *Deleted

## 2020-01-27 ENCOUNTER — Inpatient Hospital Stay (HOSPITAL_COMMUNITY)
Admission: EM | Admit: 2020-01-27 | Discharge: 2020-01-29 | DRG: 378 | Disposition: A | Discharge status: Home / Self Care | Payer: Medicare Other | Attending: Family Medicine | Admitting: Family Medicine

## 2020-01-27 DIAGNOSIS — K219 Gastro-esophageal reflux disease without esophagitis: Secondary | ICD-10-CM | POA: Diagnosis present

## 2020-01-27 DIAGNOSIS — E538 Deficiency of other specified B group vitamins: Secondary | ICD-10-CM | POA: Diagnosis present

## 2020-01-27 DIAGNOSIS — K2100 Gastro-esophageal reflux disease with esophagitis, without bleeding: Secondary | ICD-10-CM | POA: Diagnosis present

## 2020-01-27 DIAGNOSIS — E785 Hyperlipidemia, unspecified: Secondary | ICD-10-CM | POA: Diagnosis present

## 2020-01-27 DIAGNOSIS — D649 Anemia, unspecified: Secondary | ICD-10-CM

## 2020-01-27 DIAGNOSIS — Z833 Family history of diabetes mellitus: Secondary | ICD-10-CM

## 2020-01-27 DIAGNOSIS — Z20822 Contact with and (suspected) exposure to covid-19: Secondary | ICD-10-CM | POA: Diagnosis present

## 2020-01-27 DIAGNOSIS — E1165 Type 2 diabetes mellitus with hyperglycemia: Secondary | ICD-10-CM

## 2020-01-27 DIAGNOSIS — I679 Cerebrovascular disease, unspecified: Secondary | ICD-10-CM | POA: Diagnosis present

## 2020-01-27 DIAGNOSIS — Z951 Presence of aortocoronary bypass graft: Secondary | ICD-10-CM

## 2020-01-27 DIAGNOSIS — I10 Essential (primary) hypertension: Secondary | ICD-10-CM | POA: Diagnosis present

## 2020-01-27 DIAGNOSIS — K2951 Unspecified chronic gastritis with bleeding: Principal | ICD-10-CM | POA: Diagnosis present

## 2020-01-27 DIAGNOSIS — Z888 Allergy status to other drugs, medicaments and biological substances status: Secondary | ICD-10-CM

## 2020-01-27 DIAGNOSIS — Z86010 Personal history of colonic polyps: Secondary | ICD-10-CM

## 2020-01-27 DIAGNOSIS — D509 Iron deficiency anemia, unspecified: Secondary | ICD-10-CM | POA: Diagnosis present

## 2020-01-27 DIAGNOSIS — N400 Enlarged prostate without lower urinary tract symptoms: Secondary | ICD-10-CM | POA: Diagnosis present

## 2020-01-27 DIAGNOSIS — Z7982 Long term (current) use of aspirin: Secondary | ICD-10-CM

## 2020-01-27 DIAGNOSIS — Z79899 Other long term (current) drug therapy: Secondary | ICD-10-CM

## 2020-01-27 DIAGNOSIS — F17210 Nicotine dependence, cigarettes, uncomplicated: Secondary | ICD-10-CM | POA: Diagnosis present

## 2020-01-27 DIAGNOSIS — IMO0002 Reserved for concepts with insufficient information to code with codable children: Secondary | ICD-10-CM | POA: Insufficient documentation

## 2020-01-27 DIAGNOSIS — E1169 Type 2 diabetes mellitus with other specified complication: Secondary | ICD-10-CM | POA: Diagnosis present

## 2020-01-27 DIAGNOSIS — N179 Acute kidney failure, unspecified: Secondary | ICD-10-CM | POA: Diagnosis present

## 2020-01-27 DIAGNOSIS — K922 Gastrointestinal hemorrhage, unspecified: Secondary | ICD-10-CM | POA: Diagnosis not present

## 2020-01-27 DIAGNOSIS — D62 Acute posthemorrhagic anemia: Secondary | ICD-10-CM | POA: Diagnosis present

## 2020-01-27 DIAGNOSIS — R0609 Other forms of dyspnea: Secondary | ICD-10-CM | POA: Diagnosis present

## 2020-01-27 DIAGNOSIS — F17200 Nicotine dependence, unspecified, uncomplicated: Secondary | ICD-10-CM | POA: Diagnosis present

## 2020-01-27 DIAGNOSIS — Z794 Long term (current) use of insulin: Secondary | ICD-10-CM

## 2020-01-27 DIAGNOSIS — I2510 Atherosclerotic heart disease of native coronary artery without angina pectoris: Secondary | ICD-10-CM | POA: Diagnosis present

## 2020-01-27 LAB — BASIC METABOLIC PANEL WITH GFR
Anion gap: 8 (ref 5–15)
BUN: 17 mg/dL (ref 8–23)
CO2: 24 mmol/L (ref 22–32)
Calcium: 8.7 mg/dL — ABNORMAL LOW (ref 8.9–10.3)
Chloride: 107 mmol/L (ref 98–111)
Creatinine, Ser: 1.44 mg/dL — ABNORMAL HIGH (ref 0.61–1.24)
GFR calc Af Amer: 56 mL/min — ABNORMAL LOW
GFR calc non Af Amer: 48 mL/min — ABNORMAL LOW
Glucose, Bld: 142 mg/dL — ABNORMAL HIGH (ref 70–99)
Potassium: 4 mmol/L (ref 3.5–5.1)
Sodium: 139 mmol/L (ref 135–145)

## 2020-01-27 LAB — HEPATIC FUNCTION PANEL
ALT: 13 U/L (ref 0–44)
AST: 12 U/L — ABNORMAL LOW (ref 15–41)
Albumin: 3.7 g/dL (ref 3.5–5.0)
Alkaline Phosphatase: 35 U/L — ABNORMAL LOW (ref 38–126)
Bilirubin, Direct: <0.1 mg/dL (ref 0.0–0.2)
Total Bilirubin: 0.2 mg/dL — ABNORMAL LOW (ref 0.3–1.2)
Total Protein: 6.9 g/dL (ref 6.5–8.1)

## 2020-01-27 LAB — CBC
HCT: 20.6 % — ABNORMAL LOW (ref 39.0–52.0)
HCT: 22.6 % — ABNORMAL LOW (ref 39.0–52.0)
Hemoglobin: 5.6 g/dL — CL (ref 13.0–17.0)
Hemoglobin: 6.3 g/dL — CL (ref 13.0–17.0)
MCH: 18.8 pg — ABNORMAL LOW (ref 26.0–34.0)
MCH: 19.5 pg — ABNORMAL LOW (ref 26.0–34.0)
MCHC: 27.2 g/dL — ABNORMAL LOW (ref 30.0–36.0)
MCHC: 27.9 g/dL — ABNORMAL LOW (ref 30.0–36.0)
MCV: 69.1 fL — ABNORMAL LOW (ref 80.0–100.0)
MCV: 70 fL — ABNORMAL LOW (ref 80.0–100.0)
Platelets: 266 10*3/uL (ref 150–400)
Platelets: 288 10*3/uL (ref 150–400)
RBC: 2.98 MIL/uL — ABNORMAL LOW (ref 4.22–5.81)
RBC: 3.23 MIL/uL — ABNORMAL LOW (ref 4.22–5.81)
RDW: 19.3 % — ABNORMAL HIGH (ref 11.5–15.5)
RDW: 19.8 % — ABNORMAL HIGH (ref 11.5–15.5)
WBC: 4.7 10*3/uL (ref 4.0–10.5)
WBC: 5.2 10*3/uL (ref 4.0–10.5)
nRBC: 0 % (ref 0.0–0.2)
nRBC: 0 % (ref 0.0–0.2)

## 2020-01-27 LAB — FERRITIN: Ferritin: 2 ng/mL — ABNORMAL LOW (ref 24–336)

## 2020-01-27 LAB — PREPARE RBC (CROSSMATCH)

## 2020-01-27 LAB — FOLATE: Folate: 15.3 ng/mL (ref >5.9)

## 2020-01-27 LAB — HEMOGLOBIN A1C
Hgb A1c MFr Bld: 6.6 % — ABNORMAL HIGH (ref 4.8–5.6)
Mean Plasma Glucose: 142.72 mg/dL

## 2020-01-27 LAB — ABO/RH: ABO/RH(D): A POS

## 2020-01-27 LAB — GLUCOSE, CAPILLARY: Glucose-Capillary: 121 mg/dL — ABNORMAL HIGH (ref 70–99)

## 2020-01-27 LAB — SARS CORONAVIRUS 2 BY RT PCR (HOSPITAL ORDER, PERFORMED IN ~~LOC~~ HOSPITAL LAB): SARS Coronavirus 2: NEGATIVE

## 2020-01-27 LAB — IRON AND TIBC
Iron: 20 ug/dL — ABNORMAL LOW (ref 45–182)
Saturation Ratios: 4 % — ABNORMAL LOW (ref 17.9–39.5)
TIBC: 542 ug/dL — ABNORMAL HIGH (ref 250–450)
UIBC: 522 ug/dL

## 2020-01-27 LAB — TROPONIN I (HIGH SENSITIVITY)
Troponin I (High Sensitivity): 8 ng/L (ref <18)
Troponin I (High Sensitivity): 9 ng/L

## 2020-01-27 LAB — POC OCCULT BLOOD, ED: Fecal Occult Bld: POSITIVE — AB

## 2020-01-27 LAB — VITAMIN B12: Vitamin B-12: 174 pg/mL — ABNORMAL LOW (ref 180–914)

## 2020-01-27 MED ORDER — PRAVASTATIN SODIUM 40 MG PO TABS
80.0000 mg | ORAL_TABLET | Freq: Every day | ORAL | Status: DC
  Administered 2020-01-27 – 2020-01-28 (×2): 80 mg via ORAL
  Filled 2020-01-27 (×2): qty 2

## 2020-01-27 MED ORDER — ALBUTEROL SULFATE (2.5 MG/3ML) 0.083% IN NEBU
2.5000 mg | INHALATION_SOLUTION | RESPIRATORY_TRACT | Status: DC | PRN
Start: 2020-01-27 — End: 2020-01-27

## 2020-01-27 MED ORDER — SODIUM CHLORIDE 0.9% FLUSH: 3.0000 mL | Freq: Once | INTRAVENOUS | Status: DC

## 2020-01-27 MED ORDER — FUROSEMIDE 10 MG/ML IJ SOLN
40.0000 mg | Freq: Once | INTRAMUSCULAR | Status: AC
  Administered 2020-01-27: 40 mg via INTRAVENOUS

## 2020-01-27 MED ORDER — SODIUM CHLORIDE 0.9% FLUSH: 3.0000 mL | INTRAVENOUS | Status: DC | PRN

## 2020-01-27 MED ORDER — ONDANSETRON HCL 4 MG PO TABS: 4.0000 mg | ORAL_TABLET | Freq: Four times a day (QID) | ORAL | Status: DC | PRN

## 2020-01-27 MED ORDER — POLYETHYLENE GLYCOL 3350 17 G PO PACK: 17.0000 g | PACK | Freq: Every day | ORAL | Status: DC | PRN

## 2020-01-27 MED ORDER — SODIUM CHLORIDE 0.9 % IV SOLN: 250.0000 mL | INTRAVENOUS | Status: DC | PRN

## 2020-01-27 MED ORDER — PANTOPRAZOLE SODIUM 40 MG IV SOLR
40.0000 mg | Freq: Two times a day (BID) | INTRAVENOUS | Status: DC
  Administered 2020-01-27 – 2020-01-29 (×4): 40 mg via INTRAVENOUS
  Filled 2020-01-27 (×4): qty 40

## 2020-01-27 MED ORDER — AMITRIPTYLINE HCL 25 MG PO TABS
25.0000 mg | ORAL_TABLET | Freq: Every day | ORAL | Status: DC
  Administered 2020-01-27 – 2020-01-28 (×2): 25 mg via ORAL
  Filled 2020-01-27 (×2): qty 1

## 2020-01-27 MED ORDER — GABAPENTIN 100 MG PO CAPS
200.0000 mg | ORAL_CAPSULE | Freq: Two times a day (BID) | ORAL | Status: DC
  Administered 2020-01-27 – 2020-01-29 (×4): 200 mg via ORAL
  Filled 2020-01-27 (×4): qty 2

## 2020-01-27 MED ORDER — PANTOPRAZOLE SODIUM 40 MG IV SOLR
40.0000 mg | Freq: Once | INTRAVENOUS | Status: AC
  Administered 2020-01-27: 40 mg via INTRAVENOUS
  Filled 2020-01-27: qty 40

## 2020-01-27 MED ORDER — SODIUM CHLORIDE 0.9% FLUSH
3.0000 mL | Freq: Two times a day (BID) | INTRAVENOUS | Status: DC
  Administered 2020-01-27 – 2020-01-29 (×4): 3 mL via INTRAVENOUS

## 2020-01-27 MED ORDER — INSULIN ASPART 100 UNIT/ML ~~LOC~~ SOLN: 0.0000 [IU] | Freq: Three times a day (TID) | SUBCUTANEOUS | Status: DC

## 2020-01-27 MED ORDER — TRAZODONE HCL 50 MG PO TABS: 50.0000 mg | ORAL_TABLET | Freq: Every evening | ORAL | Status: DC | PRN

## 2020-01-27 MED ORDER — ALBUTEROL SULFATE (2.5 MG/3ML) 0.083% IN NEBU: 2.5000 mg | INHALATION_SOLUTION | Freq: Four times a day (QID) | RESPIRATORY_TRACT | Status: DC | PRN

## 2020-01-27 MED ORDER — SODIUM CHLORIDE 0.9 % IV BOLUS: 500.0000 mL | Freq: Once | INTRAVENOUS | Status: DC

## 2020-01-27 MED ORDER — TRAZODONE HCL 50 MG PO TABS
50.0000 mg | ORAL_TABLET | Freq: Every day | ORAL | Status: DC
  Administered 2020-01-27 – 2020-01-28 (×2): 50 mg via ORAL
  Filled 2020-01-27 (×2): qty 1

## 2020-01-27 MED ORDER — ONDANSETRON HCL 4 MG/2ML IJ SOLN: 4.0000 mg | Freq: Four times a day (QID) | INTRAMUSCULAR | Status: DC | PRN

## 2020-01-27 MED ORDER — FINASTERIDE 5 MG PO TABS
5.0000 mg | ORAL_TABLET | Freq: Every day | ORAL | Status: DC
  Administered 2020-01-27 – 2020-01-29 (×3): 5 mg via ORAL
  Filled 2020-01-27 (×3): qty 1

## 2020-01-27 MED ORDER — OMEGA-3-ACID ETHYL ESTERS 1 G PO CAPS
3.0000 g | ORAL_CAPSULE | Freq: Every day | ORAL | Status: DC
  Administered 2020-01-28 – 2020-01-29 (×2): 3 g via ORAL
  Filled 2020-01-27 (×2): qty 3

## 2020-01-27 MED ORDER — SODIUM CHLORIDE 0.9% IV SOLUTION: Freq: Once | INTRAVENOUS | Status: DC

## 2020-01-27 MED ORDER — CYANOCOBALAMIN 1000 MCG/ML IJ SOLN
1000.0000 ug | Freq: Once | INTRAMUSCULAR | Status: AC
  Administered 2020-01-27: 1000 ug via INTRAMUSCULAR
  Filled 2020-01-27 (×2): qty 1

## 2020-01-27 MED ORDER — SODIUM CHLORIDE 0.9 % IV SOLN: 8.0000 mg/h | INTRAVENOUS | Status: DC

## 2020-01-27 MED ORDER — INSULIN ASPART 100 UNIT/ML ~~LOC~~ SOLN: 0.0000 [IU] | Freq: Every day | SUBCUTANEOUS | Status: DC

## 2020-01-27 MED ORDER — SODIUM CHLORIDE 0.9 % IV SOLN
80.0000 mg | Freq: Once | INTRAVENOUS | Status: DC
Start: 2020-01-27 — End: 2020-01-27

## 2020-01-27 MED ORDER — OMEGA-3 FATTY ACIDS 1000 MG PO CAPS: 3.0000 g | ORAL_CAPSULE | Freq: Two times a day (BID) | ORAL | Status: DC

## 2020-01-27 MED ORDER — NITROGLYCERIN 0.4 MG SL SUBL: 0.4000 mg | SUBLINGUAL_TABLET | SUBLINGUAL | Status: DC | PRN

## 2020-01-27 NOTE — ED Notes (Signed)
Lab in room.

## 2020-01-27 NOTE — ED Notes (Signed)
CRITICAL VALUE ALERT  Critical Value:  hgb 5.6  Date & Time Notied: 1327  01/27/20  Provider Notified: haviland  Orders Received/Actions taken: noted

## 2020-01-27 NOTE — H&P (Signed)
Patient Demographics:    Kyle Daniel, is a 74 y.o. male  MRN: [id number]   DOB - March 10, 1946  Admit Date - 01/27/2020  Outpatient Primary MD for the patient is Pearson Grippe, MD   Assessment & Plan:    Principal Problem:   Acute GI bleeding Active Problems:   CAD (coronary artery disease)--three-vessel bypass in 2007   TOBACCO ABUSE   CEREBROVASCULAR DISEASE   HTN (hypertension)   GERD (gastroesophageal reflux disease)   Type 2 diabetes mellitus with other specified complication (HCC)   B12 deficiency  1)Acute GI bleed--hold aspirin, give IV Protonix, transfuse 2 units of PRBC with Lasix in between, -Serial H&H, -GI consult for endoluminal evaluation -Patient had EGD in October 2020 at the [facility] in [place] after acute GI bleed requiring 5 units of packed red blood cells at the time -No hematemesis, no frank hematochezia or bright red blood per rectum,  -Hemoccult positive -N.p.o. after midnight for endoluminal evaluation  2)Acute Symptomatic Anemia--- secondary to #1 above in the setting of underlying B12 deficiency -Patient presented with dyspnea on exertion, chest discomfort, dizziness and fatigue with weakness -- hemoglobin down to 5.6 -Transfuse as above #1 -B12 is low at 174, folate is normal at 15.3 -LFTs are not elevated -Ferritin is very very low at 2, serum iron is very low at 20 with iron saturation of only 4 and a TIBC of 542 -Given B12 injection  3)H/o CAD--three-vessel bypass back in 2007--- try to keep hgb close to 9 -Some dizzy spells and chest discomfort which may be secondary to #1 #2 above -Troponin is not elevated (8)  4)DM2--check A1c, hold scheduled NPH insulin for now due to n.p.o. status --allow some permissive Hyperglycemia rather than risk life-threatening hypoglycemia in a  patient with unreliable oral intake. Use Novolog/Humalog Sliding scale insulin with Accu-Cheks/Fingersticks as ordered  5)HTN--stable at this time hold amlodipine and Minipress due to GI bleed with risk of hypotension  6)Tobacco Abuse--- patient is not interested in smoking cessation  7)AKI----Vs CKD unspecified stage -   creatinine on admission=1.4  , baseline creatinine = unknown   ,  --- Unable to determine if decrease CKD or AKI as no prior renal parameters are available renally adjust medications, avoid nephrotoxic agents / dehydration  / hypotension  Disposition/Need for in-Hospital Stay- patient unable to be discharged at this time due to --acute GI bleed with symptomatic anemia with hemoglobin down to 5.6 requiring transfusion and further stabilization and endoluminal evaluation  Dispo: The patient is from: Home              Anticipated d/c is to: Home              Anticipated d/c date is: 2 days              Patient currently is not medically stable to d/c. Barriers: Not Clinically Stable- -acute GI bleed with symptomatic anemia with hemoglobin down to  5.6 requiring transfusion and further stabilization and endoluminal evaluation  With History of - Reviewed by me  Past Medical History:  Diagnosis Date  . ASCVD (arteriosclerotic cardiovascular disease)    S/P CABG surgery  in 2004; cardiac cath in 2005 revealed patent grafts.  Marland Kitchen BPH (benign prostatic hypertrophy)   . Carotid artery occlusion   . Cerebrovascular disease    bilateral bruits  . DM (diabetes mellitus) (HCC)    AODM-- insulin requiring for the past 12yrs  . Hx of adenomatous colonic polyps   . Hyperlipidemia   . Hypertension   . Tobacco abuse    50 pack years; current 1 pack per day      Past Surgical History:  Procedure Laterality Date  . CERVICAL DISCECTOMY  1996   anterior cervical spine discectomy and fusion  . CHOLECYSTECTOMY  2005  . COLONOSCOPY W/ POLYPECTOMY     4 adenomatous polyps  . CORONARY  ARTERY BYPASS GRAFT     Chief Complaint  Patient presents with  . Chest Pain      HPI:    Kyle Daniel  is a 74 y.o. male with past medical history relevant for ongoing tobacco abuse, history of CAD with prior three-vessel CABG in 2007, HTN, obesity, DM who presents to the ED with concerns about weakness, dizziness, chest discomfort and dyspnea on exertion -In the ED patient is found to have a hemoglobin of 5.6, -No hematemesis, no frank hematochezia or bright red blood per rectum, -Patient denies NSAID use --EDP discussed case with on-call GI physician recommended IV Protonix and possible endoluminal evaluation in a.m. -- -Patient apparently had an episode of GI bleed back in October 2020 at the [facility] in [place], apparently patient required 5 units of packed cells at the time--he did have an EGD at that time according to patient at that time they found a mucosal tear which may explain his bleeding, colonoscopy was not completed at that time apparently due to poor prep - -Initial troponin is not elevated patient has no further chest discomfort -Creatinine is 1.4 it is unclear if this is CKD versus AKI -Chest x-ray in the ED without acute findings   Review of systems:    In addition to the HPI above,   A full Review of  Systems was done, all other systems reviewed are negative except as noted above in HPI , .   Social History:  Reviewed by me    Social History   Tobacco Use  . Smoking status: Current Every Day Smoker    Packs/day: 1.50    Years: 50.00    Pack years: 75.00    Types: Cigarettes    Start date: 09/05/1960  . Smokeless tobacco: Never Used  Substance Use Topics  . Alcohol use: No    Comment: quit 1980's     Family History :  Reviewed by me   Family History  Problem Relation Age of Onset  . Pneumonia Mother 89       complications of diabetes  . Pneumonia Father 90       complication of pneumonia  . Cancer Sister        breast  . Cancer Sister         breast cancer    Home Medications:   Prior to Admission medications   Medication Sig Start Date End Date Taking? Authorizing Provider  albuterol (VENTOLIN HFA) 108 (90 Base) MCG/ACT inhaler Inhale 1-2 puffs into the lungs every 6 (six) hours  as needed for wheezing or shortness of breath.  01/21/20  Yes [provider]  amitriptyline (ELAVIL) 25 MG tablet Take 25 mg by mouth at bedtime.   Yes [provider]  amLODipine (NORVASC) 10 MG tablet Take 1 tablet (10 mg total) by mouth daily. Patient taking differently: Take 10 mg by mouth at bedtime.  12/07/10  Yes RothbartGerrit Friends, Robert M, MD  aspirin 81 MG tablet Take 81 mg by mouth every morning.    Yes [provider]  cholecalciferol (VITAMIN D3) 25 MCG (1000 UNIT) tablet Take 2,000 Units by mouth every morning.    Yes [provider]  dorzolamide-timolol (COSOPT) 22.3-6.8 MG/ML ophthalmic solution Place 1 drop into the right eye 2 (two) times daily.   Yes [provider]  finasteride (PROSCAR) 5 MG tablet Take 5 mg by mouth at bedtime.    Yes [provider]  fish oil-omega-3 fatty acids 1000 MG capsule Take 3 g by mouth 2 (two) times daily.    Yes [provider]  gabapentin (NEURONTIN) 100 MG capsule Take 200 mg by mouth 2 (two) times daily.    Yes [provider]  insulin glargine (LANTUS) 100 UNIT/ML injection Inject 30 Units into the skin in the morning and at bedtime.   Yes [provider]  insulin NPH (HUMULIN N,NOVOLIN N) 100 UNIT/ML injection Inject 6 Units into the skin daily as needed (for blood sugar levels over 250).    Yes [provider]  losartan (COZAAR) 25 MG tablet Take 75 mg by mouth daily.   Yes [provider]  nitroGLYCERIN (NITROSTAT) 0.4 MG SL tablet Place 1 tablet (0.4 mg total) under the tongue every 5 (five) minutes as needed for chest pain. 09/12/19 01/27/20 Yes Laqueta LindenKoneswaran, Suresh A, MD  pioglitazone (ACTOS) 45 MG tablet Take 45 mg  by mouth every morning.   Yes [provider]  pravastatin (PRAVACHOL) 80 MG tablet Take 1 tablet (80 mg total) by mouth daily. Patient taking differently: Take 80 mg by mouth at bedtime.  11/19/10  Yes Rothbart, Gerrit Friendsobert M, MD  prazosin (MINIPRESS) 2 MG capsule Take 2 mg by mouth at bedtime.   Yes [provider]  traZODone (DESYREL) 50 MG tablet Take 50 mg by mouth at bedtime.     Yes [provider]  torsemide (DEMADEX) 20 MG tablet Take 20 mg by mouth daily.    [provider]     Allergies:    Allergies  Allergen Reactions  . Lisinopril Cough     Physical Exam:   Vitals  Blood pressure 129/63, pulse 80, temperature 97.7 F (36.5 C), temperature source Oral, resp. rate (!) 23, height 6\' 2"  (1.88 m), weight 122.5 kg, SpO2 100 %.  Physical Examination: General appearance - alert, obese appearing, and in no distress  Mental status - alert, oriented to person, place, and time,  Eyes - sclera anicteric Neck - supple, no JVD elevation , Chest - clear  to auscultation bilaterally, symmetrical air movement,  Heart - S1 and S2 normal, regular, prior CABG scar Abdomen - soft, nontender, nondistended, no masses or organomegaly Neurological - screening mental status exam normal, neck supple without rigidity, cranial nerves II through XII intact, DTR's normal and symmetric Extremities -1+ pitting pedal edema noted, intact peripheral pulses  Skin - warm, dry   Data Review:    CBC Recent Labs  Lab 01/27/20 1309  WBC 4.7  HGB 5.6*  HCT 20.6*  PLT 266  MCV 69.1*  MCH 18.8*  MCHC 27.2*  RDW 19.3*    Chemistries  Recent Labs  Lab 01/27/20 1309  NA 139  K 4.0  CL 107  CO2 24  GLUCOSE 142*  BUN 17  CREATININE 1.44*  CALCIUM 8.7*  AST 12*  ALT 13  ALKPHOS 35*  BILITOT 0.2*   ------------------------------------------------------------------------------------------------------------------ estimated creatinine clearance is 64.5 mL/min  (A) (by C-G formula based on SCr of 1.44 mg/dL (H)). ------------------------------------------------------------------------------------------------------------------ No results for input(s): TSH, T4TOTAL, T3FREE, THYROIDAB in the last 72 hours.  Invalid input(s): FREET3   Coagulation profile No results for input(s): INR, PROTIME in the last 168 hours. ------------------------------------------------------------------------------------------------------------------- No results for input(s): DDIMER in the last 72 hours. -------------------------------------------------------------------------------------------------------------------  Cardiac Enzymes No results for input(s): CKMB, TROPONINI, MYOGLOBIN in the last 168 hours.  Invalid input(s): CK ------------------------------------------------------------------------------------------------------------------ No results found for: BNP  Urinalysis No results found for: COLORURINE, APPEARANCEUR, LABSPEC, PHURINE, GLUCOSEU, HGBUR, BILIRUBINUR, KETONESUR, PROTEINUR, UROBILINOGEN, NITRITE, LEUKOCYTESUR    Imaging Results:    DG Chest 2 View  Result Date: 01/27/2020 CLINICAL DATA:  Chest pain EXAM: CHEST - 2 VIEW COMPARISON:  10/22/2013 FINDINGS: Status post median sternotomy. Both lungs are clear. The visualized skeletal structures are unremarkable. IMPRESSION: No acute abnormality of the lungs. Electronically Signed   By: Lauralyn Primes M.D.   On: 01/27/2020 13:19    Radiological Exams on Admission: DG Chest 2 View  Result Date: 01/27/2020 CLINICAL DATA:  Chest pain EXAM: CHEST - 2 VIEW COMPARISON:  10/22/2013 FINDINGS: Status post median sternotomy. Both lungs are clear. The visualized skeletal structures are unremarkable. IMPRESSION: No acute abnormality of the lungs. Electronically Signed   By: Lauralyn Primes M.D.   On: 01/27/2020 13:19    DVT Prophylaxis -SCD  /Gi Bleed AM Labs Ordered, also please review Full Orders  Family  Communication: Admission, patients condition and plan of care including tests being ordered have been discussed with the patient  who indicate understanding and agree with the plan   Code Status - Full Code  Likely DC to  Home after stabilization of hemodynamics  Condition   stable  Shon Hale M.D on 01/27/2020 at 5:36 PM Go to [email or web address] -  for contact info  [facility] - Office  [phone]

## 2020-01-27 NOTE — ED Provider Notes (Signed)
[facility] EMERGENCY DEPARTMENT Provider Note   CSN: [id number] Arrival date & time: 01/27/20  1147     History Chief Complaint  Patient presents with  . Chest Pain    Kyle Daniel is a 74 y.o. male.  Pt presents to the ED today with cp.  Pt said cp has been going on for 2 weeks.  Pt feels very light headed and feels like he's going to pass out.  Pt denies f/c.  He's been fully vaccinated against Covid.  Pt has had a GI bleed in the past.  He was treated at the [facility] in October of 2020.  He said he had 5 units of blood.  He has not had any problems since then.  He said they were supposed to do a colonoscopy, but the prep was not good.  He does not recall having an endoscopy.  Pt does take ASA due to a hx of heart disease.  No blood thinners.        Past Medical History:  Diagnosis Date  . ASCVD (arteriosclerotic cardiovascular disease)    S/P CABG surgery  in 2004; cardiac cath in 2005 revealed patent grafts.  Marland Kitchen BPH (benign prostatic hypertrophy)   . Carotid artery occlusion   . Cerebrovascular disease    bilateral bruits  . DM (diabetes mellitus) (HCC)    AODM-- insulin requiring for the past 3yrs  . Hx of adenomatous colonic polyps   . Hyperlipidemia   . Hypertension   . Tobacco abuse    50 pack years; current 1 pack per day    Patient Active Problem List   Diagnosis Date Noted  . Acute GI bleeding 01/27/2020  . GERD (gastroesophageal reflux disease) 09/29/2010  . DIABETES MELLITUS, TYPE II 04/07/2009  . TOBACCO ABUSE 04/07/2009  . ATHEROSCLEROTIC CARDIOVASCULAR DISEASE 04/07/2009  . CEREBROVASCULAR DISEASE 04/07/2009  . DEGENERATIVE DISC DISEASE, CERVICAL SPINE 04/07/2009  . BENIGN PROSTATIC HYPERTROPHY, MILD, HX OF 04/07/2009  . Hyperlipidemia 04/06/2009  . HYPERTENSION 04/06/2009    Past Surgical History:  Procedure Laterality Date  . CERVICAL DISCECTOMY  1996   anterior cervical spine discectomy and fusion  . CHOLECYSTECTOMY  2005  . COLONOSCOPY W/  POLYPECTOMY     4 adenomatous polyps  . CORONARY ARTERY BYPASS GRAFT         Family History  Problem Relation Age of Onset  . Pneumonia Mother 22       complications of diabetes  . Pneumonia Father 90       complication of pneumonia  . Cancer Sister        breast  . Cancer Sister        breast cancer    Social History   Tobacco Use  . Smoking status: Current Every Day Smoker    Packs/day: 1.50    Years: 50.00    Pack years: 75.00    Types: Cigarettes    Start date: 09/05/1960  . Smokeless tobacco: Never Used  Substance Use Topics  . Alcohol use: No    Comment: quit 1980's  . Drug use: No    Home Medications Prior to Admission medications   Medication Sig Start Date End Date Taking? Authorizing Provider  amitriptyline (ELAVIL) 25 MG tablet Take 25 mg by mouth at bedtime.    [provider]  amLODipine (NORVASC) 10 MG tablet Take 1 tablet (10 mg total) by mouth daily. 12/07/10   Kathlen Brunswick, MD  aspirin 81 MG tablet Take 81 mg  by mouth daily.      [provider]  finasteride (PROSCAR) 5 MG tablet Take 5 mg by mouth daily.      [provider]  fish oil-omega-3 fatty acids 1000 MG capsule Take 3 g by mouth 2 (two) times daily.     [provider]  gabapentin (NEURONTIN) 100 MG capsule Take 200 mg by mouth 2 (two) times daily.     [provider]  insulin NPH (HUMULIN N,NOVOLIN N) 100 UNIT/ML injection Inject 32 Units into the skin 2 (two) times daily.     [provider]  nitroGLYCERIN (NITROSTAT) 0.4 MG SL tablet Place 1 tablet (0.4 mg total) under the tongue every 5 (five) minutes as needed for chest pain. 09/12/19 12/11/19  Laqueta Linden, MD  pantoprazole (PROTONIX) 20 MG tablet Take 20 mg by mouth daily.    [provider]  pravastatin (PRAVACHOL) 80 MG tablet Take 1 tablet (80 mg total) by mouth daily. 11/19/10   Kathlen Brunswick, MD  prazosin (MINIPRESS) 2 MG capsule Take 2 mg by mouth at  bedtime.    [provider]  traZODone (DESYREL) 50 MG tablet Take 50 mg by mouth at bedtime.      [provider]    Allergies    Lisinopril  Review of Systems   Review of Systems  Cardiovascular: Positive for chest pain.  Neurological: Positive for dizziness, weakness and light-headedness.  All other systems reviewed and are negative.   Physical Exam Updated Vital Signs BP (!) 109/57 (BP Location: Right Arm)   Pulse 77   Temp 98.4 F (36.9 C) (Oral)   Resp 18   Ht 6\' 2"  (1.88 m)   Wt 122.5 kg   SpO2 100%   BMI 34.67 kg/m   Physical Exam Vitals and nursing note reviewed. Exam conducted with a chaperone present.  Constitutional:      Appearance: He is well-developed. He is obese.  HENT:     Head: Normocephalic and atraumatic.  Eyes:     Extraocular Movements: Extraocular movements intact.     Pupils: Pupils are equal, round, and reactive to light.  Cardiovascular:     Rate and Rhythm: Normal rate and regular rhythm.     Heart sounds: Normal heart sounds.  Pulmonary:     Effort: Pulmonary effort is normal.     Breath sounds: Normal breath sounds.  Abdominal:     General: Bowel sounds are normal.     Palpations: Abdomen is soft.     Hernia: A hernia is present. Hernia is present in the left inguinal area.  Genitourinary:    Penis: Normal.      Rectum: Guaiac result positive.     Comments: Hernia is easily reducible.  Stool is brown. Musculoskeletal:        General: Normal range of motion.     Cervical back: Normal range of motion and neck supple.     Right lower leg: Edema present.     Left lower leg: Edema present.  Skin:    General: Skin is warm and dry.     Capillary Refill: Capillary refill takes less than 2 seconds.     Coloration: Skin is pale.  Neurological:     General: No focal deficit present.     Mental Status: He is alert and oriented to person, place, and time.  Psychiatric:        Mood and Affect: Mood normal.  Behavior: Behavior normal.     ED Results / Procedures / Treatments   Labs (all labs ordered are listed, but only abnormal results are displayed) Labs Reviewed  BASIC METABOLIC PANEL - Abnormal; Notable for the following components:      Result Value   Glucose, Bld 142 (*)    Creatinine, Ser 1.44 (*)    Calcium 8.7 (*)    GFR calc non Af Amer 48 (*)    GFR calc Af Amer 56 (*)    All other components within normal limits  CBC - Abnormal; Notable for the following components:   RBC 2.98 (*)    Hemoglobin 5.6 (*)    HCT 20.6 (*)    MCV 69.1 (*)    MCH 18.8 (*)    MCHC 27.2 (*)    RDW 19.3 (*)    All other components within normal limits  HEPATIC FUNCTION PANEL - Abnormal; Notable for the following components:   AST 12 (*)    Alkaline Phosphatase 35 (*)    Total Bilirubin 0.2 (*)    All other components within normal limits  POC OCCULT BLOOD, ED - Abnormal; Notable for the following components:   Fecal Occult Bld POSITIVE (*)    All other components within normal limits  SARS CORONAVIRUS 2 BY RT PCR (HOSPITAL ORDER, PERFORMED IN [facility])  TYPE AND SCREEN  PREPARE RBC (CROSSMATCH)  ABO/RH  TROPONIN I (HIGH SENSITIVITY)  TROPONIN I (HIGH SENSITIVITY)    EKG EKG Interpretation  Date/Time:  Monday January 27 2020 12:42:22 EDT Ventricular Rate:  79 PR Interval:  200 QRS Duration: 82 QT Interval:  374 QTC Calculation: 428 R Axis:   60 Text Interpretation: Normal sinus rhythm Nonspecific ST and T wave abnormality Abnormal ECG No significant change since last tracing Confirmed by Jacalyn LefevreHaviland, Meade Hogeland [phone]) on 01/27/2020 2:04:25 PM   Radiology DG Chest 2 View  Result Date: 01/27/2020 CLINICAL DATA:  Chest pain EXAM: CHEST - 2 VIEW COMPARISON:  10/22/2013 FINDINGS: Status post median sternotomy. Both lungs are clear. The visualized skeletal structures are unremarkable. IMPRESSION: No acute abnormality of the lungs. Electronically Signed   By: Lauralyn PrimesAlex  Bibbey M.D.   On:  01/27/2020 13:19    Procedures Procedures (including critical care time)  Medications Ordered in ED Medications  sodium chloride flush (NS) 0.9 % injection 3 mL (3 mLs Intravenous Not Given 01/27/20 1321)  0.9 %  sodium chloride infusion (Manually program via Guardrails IV Fluids) (has no administration in time range)  pantoprazole (PROTONIX) injection 40 mg (has no administration in time range)  sodium chloride 0.9 % bolus 500 mL (has no administration in time range)  pantoprazole (PROTONIX) injection 40 mg (has no administration in time range)    ED Course  I have reviewed the triage vital signs and the nursing notes.  Pertinent labs & imaging results that were available during my care of the patient were reviewed by me and considered in my medical decision making (see chart for details).    MDM Rules/Calculators/A&P                         Pt does have a Hgb of 5.6.  He has brown stool which is guaiac +.  Pt is symptomatic from his anemia, so he's given blood in the ED.  Pt does not have a local GI, just 1 at the [facility].  Pt d/w Dr. Jena Gaussourk who recommends 40 mg protonix  IV q 12 hr and no infusion.  He recommends clears today and NPO after midnight.    Cardiac eval so far is nl.  Pt d/w Dr. Mariea Clonts (triad) for admission.  CRITICAL CARE Performed by: Jacalyn Lefevre   Total critical care time: 30 minutes  Critical care time was exclusive of separately billable procedures and treating other patients.  Critical care was necessary to treat or prevent imminent or life-threatening deterioration.  Critical care was time spent personally by me on the following activities: development of treatment plan with patient and/or surrogate as well as nursing, discussions with consultants, evaluation of patient's response to treatment, examination of patient, obtaining history from patient or surrogate, ordering and performing treatments and interventions, ordering and review of laboratory studies,  ordering and review of radiographic studies, pulse oximetry and re-evaluation of patient's condition.  Final Clinical Impression(s) / ED Diagnoses Final diagnoses:  Gastrointestinal hemorrhage, unspecified gastrointestinal hemorrhage type  Symptomatic anemia    Rx / DC Orders ED Discharge Orders    None       Jacalyn Lefevre, MD 01/27/20 1450

## 2020-01-27 NOTE — ED Triage Notes (Signed)
Pt states he has been having chest pain x 2 weeks; pt states he had an episode of almost passing out;  Pt c/o lightheadedness

## 2020-01-28 ENCOUNTER — Observation Stay (HOSPITAL_COMMUNITY): Payer: Medicare Other | Admitting: Anesthesiology

## 2020-01-28 ENCOUNTER — Observation Stay (HOSPITAL_COMMUNITY): Payer: Medicare Other

## 2020-01-28 ENCOUNTER — Encounter (HOSPITAL_COMMUNITY)
Admission: EM | Disposition: A | Discharge status: Home / Self Care | Payer: Self-pay | Source: Home / Self Care | Attending: Family Medicine

## 2020-01-28 ENCOUNTER — Encounter (HOSPITAL_COMMUNITY): Payer: Self-pay | Admitting: Family Medicine

## 2020-01-28 DIAGNOSIS — R079 Chest pain, unspecified: Secondary | ICD-10-CM | POA: Diagnosis not present

## 2020-01-28 DIAGNOSIS — K922 Gastrointestinal hemorrhage, unspecified: Secondary | ICD-10-CM | POA: Diagnosis not present

## 2020-01-28 DIAGNOSIS — D649 Anemia, unspecified: Secondary | ICD-10-CM

## 2020-01-28 HISTORY — PX: BIOPSY: SHX5522

## 2020-01-28 HISTORY — PX: ESOPHAGOGASTRODUODENOSCOPY (EGD) WITH PROPOFOL: SHX5813

## 2020-01-28 LAB — ECHOCARDIOGRAM COMPLETE
Height: 73 in
Weight: 4359.82 oz

## 2020-01-28 LAB — GLUCOSE, CAPILLARY
Glucose-Capillary: 125 mg/dL — ABNORMAL HIGH (ref 70–99)
Glucose-Capillary: 65 mg/dL — ABNORMAL LOW (ref 70–99)
Glucose-Capillary: 76 mg/dL (ref 70–99)
Glucose-Capillary: 110 mg/dL — ABNORMAL HIGH (ref 70–99)
Glucose-Capillary: 76 mg/dL (ref 70–99)
Glucose-Capillary: 98 mg/dL (ref 70–99)

## 2020-01-28 LAB — BASIC METABOLIC PANEL
Anion gap: 8 (ref 5–15)
BUN: 14 mg/dL (ref 8–23)
CO2: 24 mmol/L (ref 22–32)
Calcium: 8.4 mg/dL — ABNORMAL LOW (ref 8.9–10.3)
Chloride: 108 mmol/L (ref 98–111)
Creatinine, Ser: 1.38 mg/dL — ABNORMAL HIGH (ref 0.61–1.24)
GFR calc Af Amer: 59 mL/min — ABNORMAL LOW (ref >60)
GFR calc non Af Amer: 51 mL/min — ABNORMAL LOW (ref >60)
Glucose, Bld: 62 mg/dL — ABNORMAL LOW (ref 70–99)
Potassium: 3.6 mmol/L (ref 3.5–5.1)
Sodium: 140 mmol/L (ref 135–145)

## 2020-01-28 LAB — CBC
HCT: 24.5 % — ABNORMAL LOW (ref 39.0–52.0)
Hemoglobin: 7 g/dL — ABNORMAL LOW (ref 13.0–17.0)
MCH: 20.3 pg — ABNORMAL LOW (ref 26.0–34.0)
MCHC: 28.6 g/dL — ABNORMAL LOW (ref 30.0–36.0)
MCV: 71.2 fL — ABNORMAL LOW (ref 80.0–100.0)
Platelets: 251 10*3/uL (ref 150–400)
RBC: 3.44 MIL/uL — ABNORMAL LOW (ref 4.22–5.81)
RDW: 20.8 % — ABNORMAL HIGH (ref 11.5–15.5)
WBC: 5.7 10*3/uL (ref 4.0–10.5)
nRBC: 0 % (ref 0.0–0.2)

## 2020-01-28 LAB — PREPARE RBC (CROSSMATCH)

## 2020-01-28 SURGERY — ESOPHAGOGASTRODUODENOSCOPY (EGD) WITH PROPOFOL
Anesthesia: General

## 2020-01-28 MED ORDER — DEXTROSE 50 % IV SOLN
INTRAVENOUS | Status: AC
  Administered 2020-01-28: 50 mL
  Filled 2020-01-28: qty 50

## 2020-01-28 MED ORDER — LACTATED RINGERS IV SOLN: Freq: Once | INTRAVENOUS | Status: AC

## 2020-01-28 MED ORDER — SODIUM CHLORIDE 0.9 % IV SOLN: INTRAVENOUS | Status: DC

## 2020-01-28 MED ORDER — LIDOCAINE HCL (CARDIAC) PF 50 MG/5ML IV SOSY
PREFILLED_SYRINGE | INTRAVENOUS | Status: DC | PRN
  Administered 2020-01-28: 100 mg via INTRAVENOUS

## 2020-01-28 MED ORDER — PERFLUTREN LIPID MICROSPHERE
1.0000 mL | INTRAVENOUS | Status: AC | PRN
  Administered 2020-01-28: 2 mL via INTRAVENOUS
  Filled 2020-01-28: qty 10

## 2020-01-28 MED ORDER — PROPOFOL 500 MG/50ML IV EMUL
INTRAVENOUS | Status: DC | PRN
  Administered 2020-01-28: 100 mg via INTRAVENOUS
  Administered 2020-01-28: 175 ug/kg/min via INTRAVENOUS

## 2020-01-28 MED ORDER — SODIUM CHLORIDE 0.9% IV SOLUTION: Freq: Once | INTRAVENOUS | Status: AC

## 2020-01-28 MED ORDER — LIDOCAINE 2% (20 MG/ML) 5 ML SYRINGE
INTRAMUSCULAR | Status: AC
  Filled 2020-01-28: qty 5

## 2020-01-28 MED ORDER — PEG 3350-KCL-NA BICARB-NACL 420 G PO SOLR
4000.0000 mL | Freq: Once | ORAL | Status: AC
  Administered 2020-01-28: 4000 mL via ORAL

## 2020-01-28 NOTE — Progress Notes (Signed)
Spoke with patient about EGD findings. Mild erosive reflux esophagitis. Subtly abnormal gastric mucosa s/p biopsied. Nothing to explain degree of IDA. Dr. Jena Gauss recommends colonoscopy. Discussed with patient including the risks, benefits, and alternatives. He is agreeable to colonoscopy tomorrow. Will prep him today and plan for colonoscopy tomorrow.

## 2020-01-28 NOTE — Progress Notes (Signed)
*  PRELIMINARY RESULTS* Echocardiogram 2D Echocardiogram has been performed with [facility].  Stacey Drain 01/28/2020, 9:58 AM

## 2020-01-28 NOTE — Progress Notes (Signed)
Patient Demographics:    Kyle Daniel, is a 74 y.o. male, DOB - 1946/04/14, ZOX:[id number]  Admit date - 01/27/2020   Admitting Physician Tenzin Pavon Mariea Clonts, MD  Outpatient Primary MD for the patient is Pearson Grippe, MD  LOS - 0   Chief Complaint  Patient presents with  . Chest Pain        Subjective:    Kyle Daniel today has no fevers, no emesis,  No chest pain,  Tolerated EGD today---  -he is ok with doing a colonoscopy prep and having a colonoscopy on 01/29/2020 in house  Assessment  & Plan :    Principal Problem:   Acute GI bleeding Active Problems:   CAD (coronary artery disease)--three-vessel bypass in 2007   TOBACCO ABUSE   CEREBROVASCULAR DISEASE   HTN (hypertension)   GERD (gastroesophageal reflux disease)   Type 2 diabetes mellitus with other specified complication (HCC)   B12 deficiency   Symptomatic anemia  Brief Summary:-  74 y.o. male with past medical history relevant for ongoing tobacco abuse, history of CAD with prior three-vessel CABG in 2007, HTN, obesity, DM with history of recurrent GI bleeds requiring transfusions admitted on 01/27/2020 with hemoglobin of 5.6 in the setting of dark stools and Hemoccult positive stools   A/p 1)Acute GI bleed--hold aspirin,  -GI consult appreciated, EGD as noted below, ---Patient had EGD in October 2020 at the [facility] in [place] after acute GI bleed requiring 5 units of packed red blood cells at the time---records from [place] [facility] requested -Plan is for colonoscopy on 01/29/2020  2)Acute Symptomatic Anemia--- secondary to #1 above in the setting of underlying B12 deficiency -Patient presented with dyspnea on exertion, chest discomfort, dizziness and fatigue with weakness -Symptoms have improved significantly after transfusion of 2 units of PBCS -Plan is to transfuse another unit of PRBC on 01/2020 for a total of 3 units this admission --  hemoglobin up to 7.0 from 5.6 after transfusion of 2 units of PRBCs -B12 is low at 174, folate is normal at 15.3 -LFTs are not elevated -Ferritin is very very low at 2, serum iron is very low at 20 with iron saturation of only 4 and a TIBC of 542 -Given B12 injection  --Management as above #1  3)H/o CAD--three-vessel bypass back in 2007--- try to keep hgb close to 9 No further chest discomfort, no further dizzy spells after transfusion of 2 units of packed cells -EKG is nonacute,  echocardiogram on 01/28/2020 with preserved EF of 60 to 65% without wall motion abnormalities or diastolic dysfunction -Troponin is not elevated (8)  4)DM2--check A1c, hold scheduled NPH insulin for now due to n.p.o. status --allow some permissive Hyperglycemia rather than risk life-threatening hypoglycemia in a patient with unreliable oral intake. Use Novolog/Humalog Sliding scale insulin with Accu-Cheks/Fingersticks as ordered  5)HTN--stable at this time hold amlodipine and Minipress due to GI bleed with risk of hypotension  6)Tobacco Abuse--- patient is not interested in smoking cessation  7)AKI----Vs CKD unspecified stage -   creatinine on admission=1.44  , baseline creatinine = unknown   ,  -Creatinine down to 1.38 --- Unable to determine if this is  CKD or AKI as no prior renal parameters are available renally adjust medications, avoid nephrotoxic agents /  dehydration  / hypotension  Disposition/Need for in-Hospital Stay- patient unable to be discharged at this time due to --acute GI bleed with symptomatic anemia with hemoglobin down to 5.6 requiring transfusion and further stabilization and endoluminal evaluation -Patient required 5 units of PRBC in October 2020 for GI bleed, patient has required 3 units of PRBC this admission --Patient needs in-house endoluminal evaluation due to significant high risk of rebleeding/ongoing bleeding if his GI/endoluminal evaluation is incomplete prior to discharge  home  Dispo: The patient is from: Home  Anticipated d/c is to: Home  Anticipated d/c date is: 2 days  Patient currently is not medically stable to d/c. Barriers: Not Clinically Stable- ---acute GI bleed with symptomatic anemia with hemoglobin down to 5.6 requiring transfusion and further stabilization and endoluminal evaluation -Patient required 5 units of PRBC in October 2020 for GI bleed, patient has required 3 units of PRBC this admission --Patient needs in-house endoluminal evaluation due to significant high risk of rebleeding/ongoing bleeding if his GI/endoluminal evaluation is incomplete prior to discharge home  Code Status : Full code  Family Communication:    (patient is alert, awake and coherent)  -Discussed with patient's daughter  Consults  :  Gi  Procedures:- EGD on 01/28/2020 without definite etiology for patient's symptomatic anemia and GI bleed -  DVT Prophylaxis  :  - SCDs /Gi Bleed  Lab Results  Component Value Date   PLT 251 01/28/2020    Inpatient Medications  Scheduled Meds: . sodium chloride   Intravenous Once  . amitriptyline  25 mg Oral QHS  . finasteride  5 mg Oral Daily  . gabapentin  200 mg Oral BID  . insulin aspart  0-5 Units Subcutaneous QHS  . insulin aspart  0-8 Units Subcutaneous TID WC  . omega-3 acid ethyl esters  3 g Oral Daily  . pantoprazole (PROTONIX) IV  40 mg Intravenous Q12H  . polyethylene glycol-electrolytes  4,000 mL Oral Once  . pravastatin  80 mg Oral Daily  . sodium chloride flush  3 mL Intravenous Once  . sodium chloride flush  3 mL Intravenous Q12H  . traZODone  50 mg Oral QHS   Continuous Infusions: . sodium chloride    . sodium chloride Stopped (01/27/20 1520)   PRN Meds:.sodium chloride, albuterol, nitroGLYCERIN, ondansetron **OR** ondansetron (ZOFRAN) IV, polyethylene glycol, sodium chloride flush    Anti-infectives (From admission, onward)   None        Objective:    Vitals:   01/28/20 1412 01/28/20 1450 01/28/20 1500 01/28/20 1529  BP: (!) 150/72 (!) 95/57 (!) 96/59 (!) 144/69  Pulse: 72 75 74 72  Resp: 18 (!) 22 (!) 25 19  Temp: 97.6 F (36.4 C) 97.7 F (36.5 C)  97.9 F (36.6 C)  TempSrc: Oral   Oral  SpO2: 100% 100% 100% 100%  Weight: 123.6 kg     Height: 6\' 1"  (1.854 m)       Wt Readings from Last 3 Encounters:  01/28/20 123.6 kg  09/12/19 124.7 kg  05/20/19 123.4 kg     Intake/Output Summary (Last 24 hours) at 01/28/2020 1646 Last data filed at 01/28/2020 1446 Gross per 24 hour  Intake 935 ml  Output 2350 ml  Net -1415 ml     Physical Exam  Physical Examination: General appearance - alert, obese appearing, and in no distress  Mental status - alert, oriented to person, place, and time,  Eyes - sclera anicteric Neck - supple, no JVD elevation , Chest -  clear  to auscultation bilaterally, symmetrical air movement,  Heart - S1 and S2 normal, regular, prior CABG scar Abdomen - soft, nontender, nondistended, +BS Neurological - screening mental status exam normal, neck supple without rigidity, cranial nerves II through XII intact, DTR's normal and symmetric Extremities -1+ pitting pedal edema noted, intact peripheral pulses  Skin - warm, dry   Data Review:   Micro Results Recent Results (from the past 240 hour(s))  SARS Coronavirus 2 by RT PCR (hospital order, performed in [facility] hospital lab) Nasopharyngeal Nasopharyngeal Swab     Status: None   Collection Time: 01/27/20  7:00 PM   Specimen: Nasopharyngeal Swab  Result Value Ref Range Status   SARS Coronavirus 2 NEGATIVE NEGATIVE Final    Comment: (NOTE) SARS-CoV-2 target nucleic acids are NOT DETECTED.  The SARS-CoV-2 RNA is generally detectable in upper and lower respiratory specimens during the acute phase of infection. The lowest concentration of SARS-CoV-2 viral copies this assay can detect is 250 copies / mL. A negative result does not preclude SARS-CoV-2  infection and should not be used as the sole basis for treatment or other patient management decisions.  A negative result may occur with improper specimen collection / handling, submission of specimen other than nasopharyngeal swab, presence of viral mutation(s) within the areas targeted by this assay, and inadequate number of viral copies (<250 copies / mL). A negative result must be combined with clinical observations, patient history, and epidemiological information.  Fact Sheet for Patients:   BoilerBrush.com.cy  Fact Sheet for Healthcare Providers: https://pope.com/  This test is not yet approved or  cleared by the [place] FDA and has been authorized for detection and/or diagnosis of SARS-CoV-2 by FDA under an Emergency Use Authorization (EUA).  This EUA will remain in effect (meaning this test can be used) for the duration of the COVID-19 declaration under Section 564(b)(1) of the Act, 21 U.S.C. section 360bbb-3(b)(1), unless the authorization is terminated or revoked sooner.  Performed at [facility], [address], [place], [place] [address]     Radiology Reports DG Chest 2 View  Result Date: 01/27/2020 CLINICAL DATA:  Chest pain EXAM: CHEST - 2 VIEW COMPARISON:  10/22/2013 FINDINGS: Status post median sternotomy. Both lungs are clear. The visualized skeletal structures are unremarkable. IMPRESSION: No acute abnormality of the lungs. Electronically Signed   By: Lauralyn Primes M.D.   On: 01/27/2020 13:19   ECHOCARDIOGRAM COMPLETE  Result Date: 01/28/2020    ECHOCARDIOGRAM REPORT   Patient Name:   XZAVIOR REINIG Date of Exam: 01/28/2020 Medical Rec #:  [id number]  Height:       73.0 in Accession #:    [id number] Weight:       272.5 lb Date of Birth:  1945-10-22  BSA:          2.454 m Patient Age:    74 years   BP:           125/66 mmHg Patient Gender: M          HR:           71 bpm. [facility] Procedure: 2D Echo,  Cardiac Doppler and Color Doppler Indications:    Dyspnea 786.09/R06.00  History:        Patient has prior history of Echocardiogram examinations, most                 recent 05/31/2019. Risk Factors:Diabetes, Hypertension and  Dyslipidemia. TOBACCO ABUSE,ASCVD (arteriosclerotic                 cardiovascular disease) (From Hx).  Sonographer:    Celesta Gentile RCS Referring Phys: [phone] Lorraine Terriquez IMPRESSIONS  1. Left ventricular ejection fraction, by estimation, is 60 to 65%. The left ventricle has normal function. The left ventricle has no regional wall motion abnormalities. There is moderate left ventricular hypertrophy. Left ventricular diastolic parameters were normal.  2. Right ventricular systolic function is normal. The right ventricular size is normal. There is normal pulmonary artery systolic pressure. The estimated right ventricular systolic pressure is 35.7 mmHg.  3. Left atrial size was mild to moderately dilated.  4. Right atrial size was upper normal.  5. The mitral valve is grossly normal. There is an area of severe nodular annular calcification between the mitral and aortic valves without obvious impact on LVOT flow. Trivial mitral valve regurgitation.  6. The aortic valve is tricuspid. The left coronary cusp is restricted, but there is no stenosis. Aortic valve regurgitation is not visualized. Aortic valve mean gradient measures 7.0 mmHg.  7. The inferior vena cava is dilated in size with >50% respiratory variability, suggesting right atrial pressure of 8 mmHg. FINDINGS  Left Ventricle: Left ventricular ejection fraction, by estimation, is 60 to 65%. The left ventricle has normal function. The left ventricle has no regional wall motion abnormalities. Definity contrast agent was given IV to delineate the left ventricular  endocardial borders. The left ventricular internal cavity size was normal in size. There is moderate left ventricular hypertrophy. Left ventricular diastolic  parameters were normal. Right Ventricle: The right ventricular size is normal. No increase in right ventricular wall thickness. Right ventricular systolic function is normal. There is normal pulmonary artery systolic pressure. The tricuspid regurgitant velocity is 2.63 m/s, and  with an assumed right atrial pressure of 8 mmHg, the estimated right ventricular systolic pressure is 35.7 mmHg. Left Atrium: Left atrial size was mild to moderately dilated. Right Atrium: Right atrial size was upper normal. Pericardium: There is no evidence of pericardial effusion. Mitral Valve: The mitral valve is grossly normal. Severe mitral annular calcification. Trivial mitral valve regurgitation. Tricuspid Valve: The tricuspid valve is grossly normal. Tricuspid valve regurgitation is trivial. Aortic Valve: The aortic valve is tricuspid. Aortic valve regurgitation is not visualized. Aortic valve mean gradient measures 7.0 mmHg. Aortic valve peak gradient measures 14.3 mmHg. Aortic valve area, by VTI measures 2.07 cm. Pulmonic Valve: The pulmonic valve was grossly normal. Pulmonic valve regurgitation is trivial. Aorta: The aortic root is normal in size and structure. Venous: The inferior vena cava is dilated in size with greater than 50% respiratory variability, suggesting right atrial pressure of 8 mmHg. IAS/Shunts: No atrial level shunt detected by color flow Doppler.  LEFT VENTRICLE PLAX 2D LVIDd:         4.60 cm  Diastology LVIDs:         2.88 cm  LV e' lateral:   13.70 cm/s LV PW:         1.31 cm  LV E/e' lateral: 7.1 LV IVS:        1.43 cm  LV e' medial:    8.70 cm/s LVOT diam:     1.80 cm  LV E/e' medial:  11.2 LV SV:         84 LV SV Index:   34 LVOT Area:     2.54 cm  RIGHT VENTRICLE RV S prime:     11.10  cm/s TAPSE (M-mode): 2.1 cm LEFT ATRIUM              Index       RIGHT ATRIUM           Index LA diam:        4.80 cm  1.96 cm/m  RA Area:     23.70 cm LA Vol (A2C):   118.0 ml 48.09 ml/m RA Volume:   79.00 ml  32.19  ml/m LA Vol (A4C):   94.9 ml  38.67 ml/m LA Biplane Vol: 109.0 ml 44.42 ml/m  AORTIC VALVE AV Area (Vmax):    2.05 cm AV Area (Vmean):   2.11 cm AV Area (VTI):     2.07 cm AV Vmax:           189.00 cm/s AV Vmean:          124.000 cm/s AV VTI:            0.405 m AV Peak Grad:      14.3 mmHg AV Mean Grad:      7.0 mmHg LVOT Vmax:         152.00 cm/s LVOT Vmean:        103.000 cm/s LVOT VTI:          0.329 m LVOT/AV VTI ratio: 0.81  AORTA Ao Root diam: 3.30 cm MITRAL VALVE               TRICUSPID VALVE MV Area (PHT): 3.81 cm    TR Peak grad:   27.7 mmHg MV Decel Time: 199 msec    TR Vmax:        263.00 cm/s MR Peak grad: 132.2 mmHg MR Mean grad: 82.0 mmHg    SHUNTS MR Vmax:      575.00 cm/s  Systemic VTI:  0.33 m MR Vmean:     415.0 cm/s   Systemic Diam: 1.80 cm MV E velocity: 97.30 cm/s MV A velocity: 74.60 cm/s MV E/A ratio:  1.30 Nona DellSamuel Mcdowell MD Electronically signed by Nona DellSamuel Mcdowell MD Signature Date/Time: 01/28/2020/12:58:20 PM    Final      CBC Recent Labs  Lab 01/27/20 1309 01/27/20 1901 01/28/20 0435  WBC 4.7 5.2 5.7  HGB 5.6* 6.3* 7.0*  HCT 20.6* 22.6* 24.5*  PLT 266 288 251  MCV 69.1* 70.0* 71.2*  MCH 18.8* 19.5* 20.3*  MCHC 27.2* 27.9* 28.6*  RDW 19.3* 19.8* 20.8*    Chemistries  Recent Labs  Lab 01/27/20 1309 01/28/20 0435  NA 139 140  K 4.0 3.6  CL 107 108  CO2 24 24  GLUCOSE 142* 62*  BUN 17 14  CREATININE 1.44* 1.38*  CALCIUM 8.7* 8.4*  AST 12*  --   ALT 13  --   ALKPHOS 35*  --   BILITOT 0.2*  --    ------------------------------------------------------------------------------------------------------------------ No results for input(s): CHOL, HDL, LDLCALC, TRIG, CHOLHDL, LDLDIRECT in the last 72 hours.  Lab Results  Component Value Date   HGBA1C 6.6 (H) 01/27/2020   ------------------------------------------------------------------------------------------------------------------ No results for input(s): TSH, T4TOTAL, T3FREE, THYROIDAB in the last 72  hours.  Invalid input(s): FREET3 ------------------------------------------------------------------------------------------------------------------ Recent Labs    01/27/20 1309  VITAMINB12 174*  FOLATE 15.3  FERRITIN 2*  TIBC 542*  IRON 20*    Coagulation profile No results for input(s): INR, PROTIME in the last 168 hours.  No results for input(s): DDIMER in the last 72 hours.  Cardiac Enzymes No results for input(s): CKMB, TROPONINI, MYOGLOBIN in  the last 168 hours.  Invalid input(s): CK ------------------------------------------------------------------------------------------------------------------ No results found for: BNP   Shon Hale M.D on 01/28/2020 at 4:46 PM  Go to [email or web address] - for contact info  [facility] - Office  [phone]

## 2020-01-28 NOTE — Op Note (Signed)
[facility] Patient Name: Kyle Daniel Procedure Date: 01/28/2020 2:06 PM MRN: [id number] Date of Birth: 1945-10-27 Attending MD: Gennette Pac , MD CSN: [id number] Age: 74 Admit Type: Outpatient Procedure:                Upper GI endoscopy Indications:              Iron deficiency anemia, Melena Providers:                Gennette Pac, MD, Jannett Celestine, RN, Edythe Clarity, Technician Referring MD:             [facility], MD Medicines:                Propofol per Anesthesia Complications:            No immediate complications. Estimated Blood Loss:     Estimated blood loss was minimal. Procedure:                Pre-Anesthesia Assessment:                           - Prior to the procedure, a History and Physical                            was performed, and patient medications and                            allergies were reviewed. The patient's tolerance of                            previous anesthesia was also reviewed. The risks                            and benefits of the procedure and the sedation                            options and risks were discussed with the patient.                            All questions were answered, and informed consent                            was obtained. Prior Anticoagulants: The patient has                            taken no previous anticoagulant or antiplatelet                            agents. ASA Grade Assessment: III - A patient with                            severe systemic disease. After reviewing the risks  and benefits, the patient was deemed in                            satisfactory condition to undergo the procedure.                           After obtaining informed consent, the endoscope was                            passed under direct vision. Throughout the                            procedure, the patient's blood pressure, pulse, and                             oxygen saturations were monitored continuously. The                            [id number]-H190 ([id number]) scope was introduced through the                            mouth, and advanced to the second part of duodenum.                            The upper GI endoscopy was accomplished without                            difficulty. The patient tolerated the procedure                            well. Scope In: 2:35:44 PM Scope Out: 2:47:36 PM Total Procedure Duration: 0 hours 11 minutes 52 seconds  Findings:      Couple of 5-6 millimeter distal esophageal erosions. No Barrett's       epithelium.      Patchy erythema of the gastric mucosa. No ulcer, AVM or infiltrating       process. Patent Patent pylorus.      The duodenal bulb, second portion of the duodenum, third portion of the       duodenum and fourth portion of the duodenum we were inspected. Minimally       eroded duodenal mucosa in the second portion.      Please note no blood or suspect lesion causing significant GI bleeding       found anywhere in the upper GI tract. Biopsy of the stomach and duodenum       taken. Impression:               -Mild erosive reflux esophagitis. Subtly abnormal                            gastric mucosa as described ? biopsied                           -Normal duodenal bulb, second portion, third  portion and fourth portion of the duodenum aside                            from minimally eroded second portion mucosa ?                            status post biopsy                           -No blood in upper GI tract                           Today's findings do not explain GI bleeding and                            degree of iron deficiency anemia.                           . Moderate Sedation:      Moderate (conscious) sedation was personally administered by an       anesthesia professional. The following parameters were monitored: oxygen       saturation, heart  rate, blood pressure, respiratory rate, EKG, adequacy       of pulmonary ventilation, and response to care. Recommendation:           I recommend patient go ahead and have a colonoscopy                            while he is here to make sure he does not have an                            occult lesion in his colon contributing to the                            clinical picture. Capsule study of the small                            intestine may follow depending on colonoscopy                            findings. I have discussed with his son, Kayshaun Polanco and his wife [name] at [phone]. They                            have been updated on my findings and                            recommendations.                           Follow H&H. Clear liquid diet. Discuss colonoscopy  with patient when feasible. Follow-up on pathology.                            Once daily PPI for GERD. Procedure Code(s):        --- Professional ---                           [phone] [id number], Esophagogastroduodenoscopy, flexible,                            transoral; diagnostic, including collection of                            specimen(s) by brushing or washing, when performed                            (separate procedure) Diagnosis Code(s):        --- Professional ---                           K20.90, Esophagitis, unspecified without bleeding                           D50.9, Iron deficiency anemia, unspecified                           K92.1, Melena (includes Hematochezia) CPT copyright 2019 American Medical Association. All rights reserved. The codes documented in this report are preliminary and upon coder review may  be revised to meet current compliance requirements. Gerrit Friendsobert M. Naydeen Speirs, MD Gennette Pacobert Michael Amire Leazer, MD 01/28/2020 3:25:36 PM This report has been signed electronically. Number of Addenda: 0

## 2020-01-28 NOTE — Care Management Obs Status (Signed)
MEDICARE OBSERVATION STATUS NOTIFICATION   Patient Details  Name: Makyle Eslick MRN: [id number] Date of Birth: Jan 01, 1946   Medicare Observation Status Notification Given:  Yes    Annice Needy, LCSW 01/28/2020, 4:36 PM

## 2020-01-28 NOTE — Progress Notes (Signed)
   Patient's hemodynamics are currently stable -Patient is without chest pain or hypoxia -EKG with normal sinus rhythm -Echocardiogram today 01/28/2020 is with EF of 60 to 65% without wall motion abnormalities or diastolic dysfunction  --While it is desirable in patients with CAD and anemia try to get hemoglobin close to 9 , hemoglobin less than 9 in an otherwise hemodynamically stable patient with no acute cardiovascular or pulmonary/respiratory findings should not be a contraindication endoscopy on the anesthesia.  --Patient hemoglobin is currently 7 -Patient's hemoglobin does not have to be at 9 for him to undergo endoscopy under anesthesia as long as patient is hemodynamically stable and no other acute findings  --I Recommend patient be allowed to undergo endoscopic evaluation at this time  -I re-evaluated patient and found him to be stable, again I recommend patient be allowed to undergo endoluminal evaluation under anesthesia at this time  -His current hemoglobin levels should not be a contraindication to endoscopy with anesthesia  Kyle Hale, MD

## 2020-01-28 NOTE — Consult Note (Addendum)
Referring Provider: No ref. provider found  Primary Care Physician:  Pearson Grippe, MD Primary Gastroenterologist:  Dr. Darrick Penna historically (Dr. Jena Gauss following for now)  Date of Admission: 01/27/20 Date of Consultation: 01/28/20  Reason for Consultation:  Anemia, Heme + stool  HPI:  Kyle Daniel is a 74 y.o. year old male  with history of tobacco abuse, history of CAD with prior three-vessel CABG in 2007, carotid artery disease, HTN, HLD, obesity, DM who presents to the ED with concerns about weakness, dizziness, chest discomfort and dyspnea on exertion. In the ED, he was found to have hemoglobin 5.6. Denied overt GI bleeding. Stool heme positive. Ferritin 2, iron 20, saturation 4%, B12 174, folate 15.3.   GI was consulted and recommended IV PPI BID and NPO after midnight with full consult to follow in the am.  He received 2 units PRBCs with post transfusion hemoglobin 7.0 this morning.   Today:  Was having 2-3 months of black stool when he presented to [place], [place] in October 2020. No brbpr or hematemesis. Had an EGD. Was told he had a "tear" in his esophagus. Doesn't think he had an ulcer. Doesn't remember having biopsies. Was not placed on a PPI.   Had black stool Saturday/Sunday. Has been having black stool intermittently since hospitalization about 2-3 times a week. Denies NSAIDs other than baby aspirin. No alcohol.   Used to take ranitidine for GERD. Not currently taking anything and is not having any reflux symptoms. No dysphagia. Occasional N/V about 1-2 times a month typically triggered by coughing.  No association with meals.  No abdominal pain. No unintentional weight loss.   [facility] 4 adenomatous polyps (per patient).   [facility] in [facility]March of this year but prep was poor. Patient states he wasn't able to complete the entire prep due to having to drive 75 miles to [place] and had to be there at 9am. Supposed to be rescheduling.    Doesn't have a primary GI doctor with the [place]. [facility].   Intermittent lightheadedness/ dizziness without syncope. SOB with exertyion, no SOB at rest. Admits to intermittent CP. No palpitations. No CP this morning. Feeling somewhat improved since he received blood yesterday. Ready to go home as soon as possible.   Past Medical History:  Diagnosis Date  . ASCVD (arteriosclerotic cardiovascular disease)    S/P CABG surgery  in 2004; cardiac cath in 2005 revealed patent grafts.  Marland Kitchen BPH (benign prostatic hypertrophy)   . Carotid artery occlusion   . Cerebrovascular disease    bilateral bruits  . DM (diabetes mellitus) (HCC)    AODM-- insulin requiring for the past 23yrs  . Hx of adenomatous colonic polyps   . Hyperlipidemia   . Hypertension   . Tobacco abuse    50 pack years; current 1 pack per day    Past Surgical History:  Procedure Laterality Date  . CERVICAL DISCECTOMY  1996   anterior cervical spine discectomy and fusion  . CHOLECYSTECTOMY  2005  . COLONOSCOPY W/ POLYPECTOMY     4 adenomatous polyps  . CORONARY ARTERY BYPASS GRAFT    . ESOPHAGOGASTRODUODENOSCOPY  07/04/2007   Dr. Darrick Penna; normal esophagus, erythema/edema in gastric antrum s/p biopsied, duodenitis.  Pathology positive for H. pylori.    Prior to Admission medications   Medication Sig Start Date End Date Taking? Authorizing Provider  albuterol (VENTOLIN HFA) 108 (90 Base) MCG/ACT inhaler  Inhale 1-2 puffs into the lungs every 6 (six) hours as needed for wheezing or shortness of breath.  01/21/20  Yes [provider]  amitriptyline (ELAVIL) 25 MG tablet Take 25 mg by mouth at bedtime.   Yes [provider]  amLODipine (NORVASC) 10 MG tablet Take 1 tablet (10 mg total) by mouth daily. Patient taking differently: Take 10 mg by mouth at bedtime.  12/07/10  Yes Rothbart, Rudene Poulsen M, MD  aspirin 81 MG tablet Take 81 mg by mouth every morning.    Yes [provider]   cholecalciferol (VITAMIN D3) 25 MCG (1000 UNIT) tablet Take 2,000 Units by mouth every morning.    Yes [provider]  dorzolamide-timolol (COSOPT) 22.3-6.8 MG/ML ophthalmic solution Place 1 drop into the right eye 2 (two) times daily.   Yes [provider]  finasteride (PROSCAR) 5 MG tablet Take 5 mg by mouth at bedtime.    Yes [provider]  fish oil-omega-3 fatty acids 1000 MG capsule Take 3 g by mouth 2 (two) times daily.    Yes [provider]  gabapentin (NEURONTIN) 100 MG capsule Take 200 mg by mouth 2 (two) times daily.    Yes [provider]  insulin glargine (LANTUS) 100 UNIT/ML injection Inject 30 Units into the skin in the morning and at bedtime.   Yes [provider]  insulin NPH (HUMULIN N,NOVOLIN N) 100 UNIT/ML injection Inject 6 Units into the skin daily as needed (for blood sugar levels over 250).    Yes [provider]  losartan (COZAAR) 25 MG tablet Take 75 mg by mouth daily.   Yes [provider]  nitroGLYCERIN (NITROSTAT) 0.4 MG SL tablet Place 1 tablet (0.4 mg total) under the tongue every 5 (five) minutes as needed for chest pain. 09/12/19 01/27/20 Yes Koneswaran, Suresh A, MD  pioglitazone (ACTOS) 45 MG tablet Take 45 mg by mouth every morning.   Yes [provider]  pravastatin (PRAVACHOL) 80 MG tablet Take 1 tablet (80 mg total) by mouth daily. Patient taking differently: Take 80 mg by mouth at bedtime.  11/19/10  Yes Rothbart, Kamonte Mcmichen M, MD  prazosin (MINIPRESS) 2 MG capsule Take 2 mg by mouth at bedtime.   Yes [provider]  torsemide (DEMADEX) 20 MG tablet Take 20 mg by mouth daily.   Yes [provider]  traZODone (DESYREL) 50 MG tablet Take 50 mg by mouth at bedtime.     Yes [provider]    Current Facility-Administered Medications  Medication Dose Route Frequency Provider Last Rate Last Admin  . 0.9 %  sodium chloride infusion (Manually program via  Guardrails IV Fluids)   Intravenous Once Emokpae, Courage, MD   Held at 01/27/20 1522  . 0.9 %  sodium chloride infusion (Manually program via Guardrails IV Fluids)   Intravenous Once Harper, Kristen S, PA-C      . 0.9 %  sodium chloride infusion  250 mL Intravenous PRN Emokpae, Courage, MD      . albuterol (PROVENTIL) (2.5 MG/3ML) 0.083% nebulizer solution 2.5 mg  2.5 mg Nebulization Q6H PRN Emokpae, Courage, MD      . amitriptyline (ELAVIL) tablet 25 mg  25 mg Oral QHS Emokpae, Courage, MD   25 mg at 01/27/20 2131  . finasteride (PROSCAR) tablet 5 mg  5 mg Oral Daily Emokpae, Courage, MD   5 mg at 01/28/20 0821  . gabapentin (NEURONTIN) capsule 200 mg  200 mg Oral BID Emokpae, Courage, MD     200 mg at 01/27/20 2137  . insulin aspart (novoLOG) injection 0-5 Units  0-5 Units Subcutaneous QHS Emokpae, Courage, MD      . insulin aspart (novoLOG) injection 0-8 Units  0-8 Units Subcutaneous TID WC Emokpae, Courage, MD      . nitroGLYCERIN (NITROSTAT) SL tablet 0.4 mg  0.4 mg Sublingual Q5 min PRN Emokpae, Courage, MD      . omega-3 acid ethyl esters (LOVAZA) capsule 3 g  3 g Oral Daily Emokpae, Courage, MD   3 g at 01/28/20 [id number]  . ondansetron (ZOFRAN) tablet 4 mg  4 mg Oral Q6H PRN Emokpae, Courage, MD       Or  . ondansetron (ZOFRAN) injection 4 mg  4 mg Intravenous Q6H PRN Emokpae, Courage, MD      . pantoprazole (PROTONIX) injection 40 mg  40 mg Intravenous Q12H Emokpae, Courage, MD   40 mg at 01/28/20 0821  . polyethylene glycol (MIRALAX / GLYCOLAX) packet 17 g  17 g Oral Daily PRN Emokpae, Courage, MD      . pravastatin (PRAVACHOL) tablet 80 mg  80 mg Oral Daily Emokpae, Courage, MD   80 mg at 01/27/20 2131  . sodium chloride 0.9 % bolus 500 mL  500 mL Intravenous Once Shon HaleEmokpae, Courage, MD   Held at 01/27/20 1520  . sodium chloride flush (NS) 0.9 % injection 3 mL  3 mL Intravenous Once Emokpae, Courage, MD      . sodium chloride flush (NS) 0.9 % injection 3 mL  3 mL Intravenous Q12H Emokpae,  Courage, MD   3 mL at 01/27/20 2303  . sodium chloride flush (NS) 0.9 % injection 3 mL  3 mL Intravenous PRN Emokpae, Courage, MD      . traZODone (DESYREL) tablet 50 mg  50 mg Oral QHS Emokpae, Courage, MD   50 mg at 01/27/20 2131    Allergies as of 01/27/2020 - Review Complete 01/27/2020  Allergen Reaction Noted  . Lisinopril Cough 05/20/2019    Family History  Problem Relation Age of Onset  . Pneumonia Mother [id number]       complications of diabetes  . Pneumonia Father 90       complication of pneumonia  . Cancer Sister        breast  . Cancer Sister        breast cancer  . Colon cancer Neg Hx     Social History   Socioeconomic History  . Marital status: Married    Spouse name: valeria  . Number of children: 3  . Years of education: Not on file  . Highest education level: Not on file  Occupational History  . Occupation: disabled  Tobacco Use  . Smoking status: Current Every Day Smoker    Packs/day: 1.50    Years: 50.00    Pack years: 75.00    Types: Cigarettes    Start date: 09/05/1960  . Smokeless tobacco: Never Used  Substance and Sexual Activity  . Alcohol use: No    Comment: quit 1980's  . Drug use: No  . Sexual activity: Not on file  Other Topics Concern  . Not on file  Social History Narrative  . Not on file   Social Determinants of Health   Financial Resource Strain:   . Difficulty of Paying Living Expenses:   Food Insecurity:   . Worried About Programme researcher, broadcasting/film/videounning Out of Food in the Last Year:   . [employer] Out of Food in the Last Year:  Transportation Needs:   . Freight forwarder (Medical):   Marland Kitchen Lack of Transportation (Non-Medical):   Physical Activity:   . Days of Exercise per Week:   . Minutes of Exercise per Session:   Stress:   . Feeling of Stress :   Social Connections:   . Frequency of Communication with Friends and Family:   . Frequency of Social Gatherings with Friends and Family:   . Attends Religious Services:   . Active Member of Clubs or  Organizations:   . Attends Banker Meetings:   Marland Kitchen Marital Status:   Intimate Partner Violence:   . Fear of Current or Ex-Partner:   . Emotionally Abused:   Marland Kitchen Physically Abused:   . Sexually Abused:     Review of Systems: Gen: Denies fever, chills, cold or flulike symptoms. CV: De see HPI Resp: See HPI GI: See HPI GU : Denies urinary burning, urinary frequency, urinary incontinence.  MS: Admits to chronic back pain.  Derm: Denies rash  Psych: Mild depression/anxiety Heme: See HPI  Physical Exam: Vital signs in last 24 hours: Temp:  [97.6 F (36.4 C)-98.7 F (37.1 C)] 98.1 F (36.7 C) (07/06 0404) Pulse Rate:  [71-81] 71 (07/06 0404) Resp:  [14-23] 18 (07/06 0404) BP: (109-152)/(46-75) 125/66 (07/06 0404) SpO2:  [96 %-100 %] 97 % (07/06 0859) Weight:  [122.5 kg-124.1 kg] 123.6 kg (07/06 0500) Last BM Date: 01/26/20 General:   Alert, well-developed, well-nourished, pleasant and cooperative in NAD Head:  Normocephalic and atraumatic. Eyes:  Sclera clear, no icterus. Conjunctiva pink. Ears:  Normal auditory acuity. Nose:  No deformity, discharge,  or lesions. Lungs:  Clear throughout to auscultation.   No wheezes, crackles, or rhonchi. No acute distress. Heart:  Regular rate and rhythm; no murmurs, clicks, rubs,  or gallops. Abdomen:  Soft, nontender and nondistended. No masses, hepatosplenomegaly or hernias noted. Normal bowel sounds, without guarding, and without rebound.   Rectal:  Deferred   Msk:  Symmetrical without gross deformities. Normal posture. Extremities:  With 1+ bilateral lower extremity edema. Neurologic:  Alert and  oriented x4;  grossly normal neurologically. Skin:  Intact without significant lesions or rashes. Psych: Normal mood and affect.  Intake/Output from previous day: 07/05 0701 - 07/06 0700 In: -  Out: 1900 [Urine:1900] Intake/Output this shift: No intake/output data recorded.  Lab Results: Recent Labs    01/27/20 1309  01/27/20 1901 01/28/20 0435  WBC 4.7 5.2 5.7  HGB 5.6* 6.3* 7.0*  HCT 20.6* 22.6* 24.5*  PLT 266 288 251   BMET Recent Labs    01/27/20 1309 01/28/20 0435  NA 139 140  K 4.0 3.6  CL 107 108  CO2 24 24  GLUCOSE 142* 62*  BUN 17 14  CREATININE 1.44* 1.38*  CALCIUM 8.7* 8.4*   LFT Recent Labs    01/27/20 1309  PROT 6.9  ALBUMIN 3.7  AST 12*  ALT 13  ALKPHOS 35*  BILITOT 0.2*  BILIDIR <0.1  IBILI NOT CALCULATED    Studies/Results: DG Chest 2 View  Result Date: 01/27/2020 CLINICAL DATA:  Chest pain EXAM: CHEST - 2 VIEW COMPARISON:  10/22/2013 FINDINGS: Status post median sternotomy. Both lungs are clear. The visualized skeletal structures are unremarkable. IMPRESSION: No acute abnormality of the lungs. Electronically Signed   By: Lauralyn Primes M.D.   On: 01/27/2020 13:19    Impression: 74 year old male with history of tobacco abuse, history of CAD with prior three-vessel CABG in 2007, carotid artery disease, HTN, HLD,  obesity, DM who presents to the ED with concerns about weakness, dizziness, chest discomfort and dyspnea on exertion found to have hemoglobin of 5.6, now admitted for symptomatic anemia.   Symptomatic microcytic anemia with heme positive stool: Hemoglobin 5.6 on admission with heme positive stool, Ferritin 2, iron 20, saturation 4%, B12 174, folate 15.3. Now s/p 2 units PRBCs with hemoglobin 7.0 this morning with clinical improvement. Patient reports intermittent melena 3-4 times a week at least since October 2020.   Prior hospitalization at [facility] in October 2020 for GI bleed with hemoglobin of 4.2 s/p 5 units PRBCs and EGD with findings of mucosal "tear" per patient (unable to view records). Denies hematemesis or brbpr. Reports he was not started on a PPI and has continued with intermittent melena since hospitalization.  Denies NSAIDs other than baby aspirin, no alcohol.  Of note, history of H. pylori in 2008 unclear if this was treated and/or eradicated.  Per  patient, last colonoscopy in 2019 with [place], [place] with 4 adenomatous colon polyps.  Attempted repeat colonoscopy in February/March of this year but was incomplete due to poor prep.   Most concerned that IDA is secondary to upper GI bleed with differential including esophagitis, gastritis, duodenitis, H. pylori.  Cannot rule out colonic etiology such as right-sided polyps or malignancy although less likely with TCS in 2019.  May also have AVMs. Vitamin B12 deficiency contributing.   Recommend EGD at this time reevaluate upper GI tract.  Patient has been n.p.o. since midnight. Will also order additional 1 unit PRBCs today due to symptomatic anemia and cardiac history.    B12 deficiency: Replacement per hospitalitis.   Plan: Proceed with EGD with propofol with Dr. Jena Gauss today. The risks, benefits, and alternatives have been discussed in detail with patient. They have stated understanding and desire to proceed.  Continue IV PPI twice daily for now. Further recommendations to follow EGD.  He will likely benefit from IV iron prior to discharge.    Attending note:   Agree with need for EGD.  He has remained entirely stable since admission.  Hemoglobin up to 7.0.  Additional blood has been ordered.  I discussed with anesthesia. Since not an emergency, anesthesia prefers hemoglobin to be brought up into the 9 range given comorbidities.  Therefore, we will continue acid suppression therapy empirically and plan on performing an EGD tomorrow.  We will allow clear liquids today.     LOS: 0 days    01/28/2020, 9:06 AM   Ermalinda Memos, [facility]

## 2020-01-28 NOTE — Interval H&P Note (Signed)
History and Physical Interval Note:  01/28/2020 2:23 PM  Jamarl Pew  has presented today for surgery, with the diagnosis of Anemia; heme positive stool; melena.  The various methods of treatment have been discussed with the patient and family. After consideration of risks, benefits and other options for treatment, the patient has consented to  Procedure(s): ESOPHAGOGASTRODUODENOSCOPY (EGD) WITH PROPOFOL (N/A) as a surgical intervention.  The patient's history has been reviewed, patient examined, no change in status, stable for surgery.  I have reviewed the patient's chart and labs.  Questions were answered to the patient's satisfaction.     Eula Listen  Updated, updated H&P.  After some discussion with anesthesia and anesthesia discussion with attending (hospitalist),  it is felt to proceed with EGD today with third unit of packed RBCs being infused.  Patient is entirely asymptomatic at this time and remains quite stable.  Denies dysphagia. The risks, benefits, limitations, alternatives and imponderables have been reviewed with the patient. Potential for esophageal dilation, biopsy, etc. have also been reviewed.  Questions have been answered. All parties agreeable.   Further recommendations to follow.

## 2020-01-28 NOTE — Anesthesia Preprocedure Evaluation (Addendum)
Anesthesia Evaluation  Patient identified by MRN, date of birth, ID band Patient awake    Reviewed: Allergy & Precautions, NPO status , Patient's Chart, lab work & pertinent test results  Airway Mallampati: III  TM Distance: >3 FB Neck ROM: Full    Dental no notable dental hx. (+) Loose, Missing, Dental Advisory Given   Pulmonary Current Smoker and Patient abstained from smoking.,    Pulmonary exam normal breath sounds clear to auscultation       Cardiovascular Exercise Tolerance: Good METS: 3 - Mets hypertension, Pt. on medications + CAD and + CABG  Normal cardiovascular exam Rhythm:Regular Rate:Normal - Systolic murmurs, - Diastolic murmurs, - Friction Rub, - Carotid Bruit, - Peripheral Edema and - Systolic Click 1. Left ventricular ejection fraction, by estimation, is 60 to 65%. The  left ventricle has normal function. The left ventricle has no regional  wall motion abnormalities. There is moderate left ventricular hypertrophy.  Left ventricular diastolic  parameters were normal.  2. Right ventricular systolic function is normal. The right ventricular  size is normal. There is normal pulmonary artery systolic pressure. The  estimated right ventricular systolic pressure is 35.7 mmHg.  3. Left atrial size was mild to moderately dilated.  4. Right atrial size was upper normal.  5. The mitral valve is grossly normal. There is an area of severe nodular  annular calcification between the mitral and aortic valves without obvious  impact on LVOT flow. Trivial mitral valve regurgitation.  6. The aortic valve is tricuspid. The left coronary cusp is restricted,  but there is no stenosis. Aortic valve regurgitation is not visualized.  Aortic valve mean gradient measures 7.0 mmHg.  7. The inferior vena cava is dilated in size with >50% respiratory  variability, suggesting right atrial pressure of 8 mmHg.   27-Jan-2020 12:42:22  [facility]-ED ROUTINE RECORD Normal sinus rhythm Nonspecific ST and T wave abnormality Abnormal ECG No significant change since last tracing Confirmed by Haviland, Julie ([id number]) on 01/27/2020 2:04:25 PM   Neuro/Psych    GI/Hepatic Neg liver ROS, GERD  Medicated and Controlled,Acute GI bleeding   Endo/Other  diabetes, Well Controlled, Type 2, Insulin Dependent  Renal/GU Renal InsufficiencyRenal disease     Musculoskeletal  (+) Arthritis ,   Abdominal   Peds  Hematology  (+) anemia ,   Anesthesia Other Findings Patient's hemodynamics are currently stable -Patient is without chest pain or hypoxia -EKG with normal sinus rhythm -Echocardiogram today 01/28/2020 is with EF of 60 to 65% without wall motion abnormalities or diastolic dysfunction  --While it is desirable in patients with CAD and anemia try to get hemoglobin close to 9 , hemoglobin less than 9 in an otherwise hemodynamically stable patient with no acute cardiovascular or pulmonary/respiratory findings should not be a contraindication endoscopy on the anesthesia.  --Patient hemoglobin is currently 7 -Patient's hemoglobin does not have to be at 9 for him to undergo endoscopy under anesthesia as long as patient is hemodynamically stable and no other acute findings  --I Recommend patient be allowed to undergo endoscopic evaluation at this time  -I re-evaluated patient and found him to be stable, again I recommend patient be allowed to undergo endoluminal evaluation under anesthesia at this time  -His current hemoglobin levels should not be a contraindication to endoscopy with anesthesia  Courage Emokpae, MD   Reproductive/Obstetrics                               Anesthesia Physical  Anesthesia Plan  ASA: IV  Anesthesia Plan: General   Post-op Pain Management:    Induction: Intravenous  PONV Risk Score and Plan: TIVA  Airway Management Planned: Nasal Cannula,  Natural Airway and Simple Face Mask  Additional Equipment:   Intra-op Plan:   Post-operative Plan: Possible Post-op intubation/ventilation  Informed Consent: I have reviewed the patients History and Physical, chart, labs and discussed the procedure including the risks, benefits and alternatives for the proposed anesthesia with the patient or authorized representative who has indicated his/her understanding and acceptance.     Dental advisory given  Plan Discussed with: CRNA and Surgeon  Anesthesia Plan Comments:         Anesthesia Quick Evaluation  

## 2020-01-28 NOTE — Anesthesia Postprocedure Evaluation (Signed)
Anesthesia Post Note  Patient: Erie Radu  Procedure(s) Performed: ESOPHAGOGASTRODUODENOSCOPY (EGD) WITH PROPOFOL (N/A ) BIOPSY  Patient location during evaluation: PACU Anesthesia Type: General Level of consciousness: oriented, awake and alert, awake and patient cooperative Pain management: pain level controlled Vital Signs Assessment: post-procedure vital signs reviewed and stable Respiratory status: spontaneous breathing, respiratory function stable, nonlabored ventilation and patient connected to nasal cannula oxygen Cardiovascular status: blood pressure returned to baseline and stable Postop Assessment: no headache and no backache Anesthetic complications: no   No complications documented.   Last Vitals:  Vitals:   01/28/20 1215 01/28/20 1412  BP: (!) 149/64 (!) 150/72  Pulse: 70 72  Resp: 18 18  Temp: 36.8 C 36.4 C  SpO2: 100% 100%    Last Pain:  Vitals:   01/28/20 1412  TempSrc: Oral  PainSc: 0-No pain                 Brynda Peon

## 2020-01-28 NOTE — H&P (View-Only) (Signed)
Referring Provider: No ref. provider found  Primary Care Physician:  Pearson Grippe, MD Primary Gastroenterologist:  Dr. Darrick Penna historically (Dr. Jena Gauss following for now)  Date of Admission: 01/27/20 Date of Consultation: 01/28/20  Reason for Consultation:  Anemia, Heme + stool  HPI:  Ardell Makarewicz is a 74 y.o. year old male  with history of tobacco abuse, history of CAD with prior three-vessel CABG in 2007, carotid artery disease, HTN, HLD, obesity, DM who presents to the ED with concerns about weakness, dizziness, chest discomfort and dyspnea on exertion. In the ED, he was found to have hemoglobin 5.6. Denied overt GI bleeding. Stool heme positive. Ferritin 2, iron 20, saturation 4%, B12 174, folate 15.3.   GI was consulted and recommended IV PPI BID and NPO after midnight with full consult to follow in the am.  He received 2 units PRBCs with post transfusion hemoglobin 7.0 this morning.   Today:  Was having 2-3 months of black stool when he presented to [place], [place] in October 2020. No brbpr or hematemesis. Had an EGD. Was told he had a "tear" in his esophagus. Doesn't think he had an ulcer. Doesn't remember having biopsies. Was not placed on a PPI.   Had black stool Saturday/Sunday. Has been having black stool intermittently since hospitalization about 2-3 times a week. Denies NSAIDs other than baby aspirin. No alcohol.   Used to take ranitidine for GERD. Not currently taking anything and is not having any reflux symptoms. No dysphagia. Occasional N/V about 1-2 times a month typically triggered by coughing.  No association with meals.  No abdominal pain. No unintentional weight loss.   [facility] 4 adenomatous polyps (per patient).   [facility]March of this year but prep was poor. Patient states he wasn't able to complete the entire prep due to having to drive 75 miles to [place] and had to be there at 9am. Supposed to be rescheduling.    Doesn't have a primary GI doctor with the [place]. [facility].   Intermittent lightheadedness/ dizziness without syncope. SOB with exertyion, no SOB at rest. Admits to intermittent CP. No palpitations. No CP this morning. Feeling somewhat improved since he received blood yesterday. Ready to go home as soon as possible.   Past Medical History:  Diagnosis Date  . ASCVD (arteriosclerotic cardiovascular disease)    S/P CABG surgery  in 2004; cardiac cath in 2005 revealed patent grafts.  Marland Kitchen BPH (benign prostatic hypertrophy)   . Carotid artery occlusion   . Cerebrovascular disease    bilateral bruits  . DM (diabetes mellitus) (HCC)    AODM-- insulin requiring for the past 23yrs  . Hx of adenomatous colonic polyps   . Hyperlipidemia   . Hypertension   . Tobacco abuse    50 pack years; current 1 pack per day    Past Surgical History:  Procedure Laterality Date  . CERVICAL DISCECTOMY  1996   anterior cervical spine discectomy and fusion  . CHOLECYSTECTOMY  2005  . COLONOSCOPY W/ POLYPECTOMY     4 adenomatous polyps  . CORONARY ARTERY BYPASS GRAFT    . ESOPHAGOGASTRODUODENOSCOPY  07/04/2007   Dr. Darrick Penna; normal esophagus, erythema/edema in gastric antrum s/p biopsied, duodenitis.  Pathology positive for H. pylori.    Prior to Admission medications   Medication Sig Start Date End Date Taking? Authorizing Provider  albuterol (VENTOLIN HFA) 108 (90 Base) MCG/ACT inhaler  Inhale 1-2 puffs into the lungs every 6 (six) hours as needed for wheezing or shortness of breath.  01/21/20  Yes [provider]  amitriptyline (ELAVIL) 25 MG tablet Take 25 mg by mouth at bedtime.   Yes [provider]  amLODipine (NORVASC) 10 MG tablet Take 1 tablet (10 mg total) by mouth daily. Patient taking differently: Take 10 mg by mouth at bedtime.  12/07/10  Yes RothbartGerrit Friends, MD  aspirin 81 MG tablet Take 81 mg by mouth every morning.    Yes [provider]   cholecalciferol (VITAMIN D3) 25 MCG (1000 UNIT) tablet Take 2,000 Units by mouth every morning.    Yes [provider]  dorzolamide-timolol (COSOPT) 22.3-6.8 MG/ML ophthalmic solution Place 1 drop into the right eye 2 (two) times daily.   Yes [provider]  finasteride (PROSCAR) 5 MG tablet Take 5 mg by mouth at bedtime.    Yes [provider]  fish oil-omega-3 fatty acids 1000 MG capsule Take 3 g by mouth 2 (two) times daily.    Yes [provider]  gabapentin (NEURONTIN) 100 MG capsule Take 200 mg by mouth 2 (two) times daily.    Yes [provider]  insulin glargine (LANTUS) 100 UNIT/ML injection Inject 30 Units into the skin in the morning and at bedtime.   Yes [provider]  insulin NPH (HUMULIN N,NOVOLIN N) 100 UNIT/ML injection Inject 6 Units into the skin daily as needed (for blood sugar levels over 250).    Yes [provider]  losartan (COZAAR) 25 MG tablet Take 75 mg by mouth daily.   Yes [provider]  nitroGLYCERIN (NITROSTAT) 0.4 MG SL tablet Place 1 tablet (0.4 mg total) under the tongue every 5 (five) minutes as needed for chest pain. 09/12/19 01/27/20 Yes Laqueta Linden, MD  pioglitazone (ACTOS) 45 MG tablet Take 45 mg by mouth every morning.   Yes [provider]  pravastatin (PRAVACHOL) 80 MG tablet Take 1 tablet (80 mg total) by mouth daily. Patient taking differently: Take 80 mg by mouth at bedtime.  11/19/10  Yes Rothbart, Gerrit Friends, MD  prazosin (MINIPRESS) 2 MG capsule Take 2 mg by mouth at bedtime.   Yes [provider]  torsemide (DEMADEX) 20 MG tablet Take 20 mg by mouth daily.   Yes [provider]  traZODone (DESYREL) 50 MG tablet Take 50 mg by mouth at bedtime.     Yes [provider]    Current Facility-Administered Medications  Medication Dose Route Frequency Provider Last Rate Last Admin  . 0.9 %  sodium chloride infusion (Manually program via  Guardrails IV Fluids)   Intravenous Once Shon Hale, MD   Held at 01/27/20 1522  . 0.9 %  sodium chloride infusion (Manually program via Guardrails IV Fluids)   Intravenous Once Clearance Coots, Kristen S, PA-C      . 0.9 %  sodium chloride infusion  250 mL Intravenous PRN Emokpae, Courage, MD      . albuterol (PROVENTIL) (2.5 MG/3ML) 0.083% nebulizer solution 2.5 mg  2.5 mg Nebulization Q6H PRN Emokpae, Courage, MD      . amitriptyline (ELAVIL) tablet 25 mg  25 mg Oral QHS Emokpae, Courage, MD   25 mg at 01/27/20 2131  . finasteride (PROSCAR) tablet 5 mg  5 mg Oral Daily Emokpae, Courage, MD   5 mg at 01/28/20 0821  . gabapentin (NEURONTIN) capsule 200 mg  200 mg Oral BID Shon Hale, MD  200 mg at 01/27/20 2137  . insulin aspart (novoLOG) injection 0-5 Units  0-5 Units Subcutaneous QHS Emokpae, Courage, MD      . insulin aspart (novoLOG) injection 0-8 Units  0-8 Units Subcutaneous TID WC Emokpae, Courage, MD      . nitroGLYCERIN (NITROSTAT) SL tablet 0.4 mg  0.4 mg Sublingual Q5 min PRN Emokpae, Courage, MD      . omega-3 acid ethyl esters (LOVAZA) capsule 3 g  3 g Oral Daily Emokpae, Courage, MD   3 g at 01/28/20 [id number]  . ondansetron (ZOFRAN) tablet 4 mg  4 mg Oral Q6H PRN Emokpae, Courage, MD       Or  . ondansetron (ZOFRAN) injection 4 mg  4 mg Intravenous Q6H PRN Emokpae, Courage, MD      . pantoprazole (PROTONIX) injection 40 mg  40 mg Intravenous Q12H Emokpae, Courage, MD   40 mg at 01/28/20 0821  . polyethylene glycol (MIRALAX / GLYCOLAX) packet 17 g  17 g Oral Daily PRN Emokpae, Courage, MD      . pravastatin (PRAVACHOL) tablet 80 mg  80 mg Oral Daily Emokpae, Courage, MD   80 mg at 01/27/20 2131  . sodium chloride 0.9 % bolus 500 mL  500 mL Intravenous Once Shon HaleEmokpae, Courage, MD   Held at 01/27/20 1520  . sodium chloride flush (NS) 0.9 % injection 3 mL  3 mL Intravenous Once Emokpae, Courage, MD      . sodium chloride flush (NS) 0.9 % injection 3 mL  3 mL Intravenous Q12H Emokpae,  Courage, MD   3 mL at 01/27/20 2303  . sodium chloride flush (NS) 0.9 % injection 3 mL  3 mL Intravenous PRN Emokpae, Courage, MD      . traZODone (DESYREL) tablet 50 mg  50 mg Oral QHS Emokpae, Courage, MD   50 mg at 01/27/20 2131    Allergies as of 01/27/2020 - Review Complete 01/27/2020  Allergen Reaction Noted  . Lisinopril Cough 05/20/2019    Family History  Problem Relation Age of Onset  . Pneumonia Mother [id number]       complications of diabetes  . Pneumonia Father 90       complication of pneumonia  . Cancer Sister        breast  . Cancer Sister        breast cancer  . Colon cancer Neg Hx     Social History   Socioeconomic History  . Marital status: Married    Spouse name: valeria  . Number of children: 3  . Years of education: Not on file  . Highest education level: Not on file  Occupational History  . Occupation: disabled  Tobacco Use  . Smoking status: Current Every Day Smoker    Packs/day: 1.50    Years: 50.00    Pack years: 75.00    Types: Cigarettes    Start date: 09/05/1960  . Smokeless tobacco: Never Used  Substance and Sexual Activity  . Alcohol use: No    Comment: quit 1980's  . Drug use: No  . Sexual activity: Not on file  Other Topics Concern  . Not on file  Social History Narrative  . Not on file   Social Determinants of Health   Financial Resource Strain:   . Difficulty of Paying Living Expenses:   Food Insecurity:   . Worried About Programme researcher, broadcasting/film/videounning Out of Food in the Last Year:   . [employer] Out of Food in the Last Year:  Transportation Needs:   . Freight forwarder (Medical):   Marland Kitchen Lack of Transportation (Non-Medical):   Physical Activity:   . Days of Exercise per Week:   . Minutes of Exercise per Session:   Stress:   . Feeling of Stress :   Social Connections:   . Frequency of Communication with Friends and Family:   . Frequency of Social Gatherings with Friends and Family:   . Attends Religious Services:   . Active Member of Clubs or  Organizations:   . Attends Banker Meetings:   Marland Kitchen Marital Status:   Intimate Partner Violence:   . Fear of Current or Ex-Partner:   . Emotionally Abused:   Marland Kitchen Physically Abused:   . Sexually Abused:     Review of Systems: Gen: Denies fever, chills, cold or flulike symptoms. CV: De see HPI Resp: See HPI GI: See HPI GU : Denies urinary burning, urinary frequency, urinary incontinence.  MS: Admits to chronic back pain.  Derm: Denies rash  Psych: Mild depression/anxiety Heme: See HPI  Physical Exam: Vital signs in last 24 hours: Temp:  [97.6 F (36.4 C)-98.7 F (37.1 C)] 98.1 F (36.7 C) (07/06 0404) Pulse Rate:  [71-81] 71 (07/06 0404) Resp:  [14-23] 18 (07/06 0404) BP: (109-152)/(46-75) 125/66 (07/06 0404) SpO2:  [96 %-100 %] 97 % (07/06 0859) Weight:  [122.5 kg-124.1 kg] 123.6 kg (07/06 0500) Last BM Date: 01/26/20 General:   Alert, well-developed, well-nourished, pleasant and cooperative in NAD Head:  Normocephalic and atraumatic. Eyes:  Sclera clear, no icterus. Conjunctiva pink. Ears:  Normal auditory acuity. Nose:  No deformity, discharge,  or lesions. Lungs:  Clear throughout to auscultation.   No wheezes, crackles, or rhonchi. No acute distress. Heart:  Regular rate and rhythm; no murmurs, clicks, rubs,  or gallops. Abdomen:  Soft, nontender and nondistended. No masses, hepatosplenomegaly or hernias noted. Normal bowel sounds, without guarding, and without rebound.   Rectal:  Deferred   Msk:  Symmetrical without gross deformities. Normal posture. Extremities:  With 1+ bilateral lower extremity edema. Neurologic:  Alert and  oriented x4;  grossly normal neurologically. Skin:  Intact without significant lesions or rashes. Psych: Normal mood and affect.  Intake/Output from previous day: 07/05 0701 - 07/06 0700 In: -  Out: 1900 [Urine:1900] Intake/Output this shift: No intake/output data recorded.  Lab Results: Recent Labs    01/27/20 1309  01/27/20 1901 01/28/20 0435  WBC 4.7 5.2 5.7  HGB 5.6* 6.3* 7.0*  HCT 20.6* 22.6* 24.5*  PLT 266 288 251   BMET Recent Labs    01/27/20 1309 01/28/20 0435  NA 139 140  K 4.0 3.6  CL 107 108  CO2 24 24  GLUCOSE 142* 62*  BUN 17 14  CREATININE 1.44* 1.38*  CALCIUM 8.7* 8.4*   LFT Recent Labs    01/27/20 1309  PROT 6.9  ALBUMIN 3.7  AST 12*  ALT 13  ALKPHOS 35*  BILITOT 0.2*  BILIDIR <0.1  IBILI NOT CALCULATED    Studies/Results: DG Chest 2 View  Result Date: 01/27/2020 CLINICAL DATA:  Chest pain EXAM: CHEST - 2 VIEW COMPARISON:  10/22/2013 FINDINGS: Status post median sternotomy. Both lungs are clear. The visualized skeletal structures are unremarkable. IMPRESSION: No acute abnormality of the lungs. Electronically Signed   By: Lauralyn Primes M.D.   On: 01/27/2020 13:19    Impression: 74 year old male with history of tobacco abuse, history of CAD with prior three-vessel CABG in 2007, carotid artery disease, HTN, HLD,  obesity, DM who presents to the ED with concerns about weakness, dizziness, chest discomfort and dyspnea on exertion found to have hemoglobin of 5.6, now admitted for symptomatic anemia.   Symptomatic microcytic anemia with heme positive stool: Hemoglobin 5.6 on admission with heme positive stool, Ferritin 2, iron 20, saturation 4%, B12 174, folate 15.3. Now s/p 2 units PRBCs with hemoglobin 7.0 this morning with clinical improvement. Patient reports intermittent melena 3-4 times a week at least since October 2020.   Prior hospitalization at [facility] in October 2020 for GI bleed with hemoglobin of 4.2 s/p 5 units PRBCs and EGD with findings of mucosal "tear" per patient (unable to view records). Denies hematemesis or brbpr. Reports he was not started on a PPI and has continued with intermittent melena since hospitalization.  Denies NSAIDs other than baby aspirin, no alcohol.  Of note, history of H. pylori in 2008 unclear if this was treated and/or eradicated.  Per  patient, last colonoscopy in 2019 with [place], [place] with 4 adenomatous colon polyps.  Attempted repeat colonoscopy in February/March of this year but was incomplete due to poor prep.   Most concerned that IDA is secondary to upper GI bleed with differential including esophagitis, gastritis, duodenitis, H. pylori.  Cannot rule out colonic etiology such as right-sided polyps or malignancy although less likely with TCS in 2019.  May also have AVMs. Vitamin B12 deficiency contributing.   Recommend EGD at this time reevaluate upper GI tract.  Patient has been n.p.o. since midnight. Will also order additional 1 unit PRBCs today due to symptomatic anemia and cardiac history.    B12 deficiency: Replacement per hospitalitis.   Plan: Proceed with EGD with propofol with Dr. Jena Gauss today. The risks, benefits, and alternatives have been discussed in detail with patient. They have stated understanding and desire to proceed.  Continue IV PPI twice daily for now. Further recommendations to follow EGD.  He will likely benefit from IV iron prior to discharge.    Attending note:   Agree with need for EGD.  He has remained entirely stable since admission.  Hemoglobin up to 7.0.  Additional blood has been ordered.  I discussed with anesthesia. Since not an emergency, anesthesia prefers hemoglobin to be brought up into the 9 range given comorbidities.  Therefore, we will continue acid suppression therapy empirically and plan on performing an EGD tomorrow.  We will allow clear liquids today.     LOS: 0 days    01/28/2020, 9:06 AM   Ermalinda Memos, [facility]

## 2020-01-28 NOTE — Transfer of Care (Signed)
Immediate Anesthesia Transfer of Care Note  Patient: Devyon Keator  Procedure(s) Performed: ESOPHAGOGASTRODUODENOSCOPY (EGD) WITH PROPOFOL (N/A ) BIOPSY  Patient Location: PACU  Anesthesia Type:MAC  Level of Consciousness: awake, alert , oriented and patient cooperative  Airway & Oxygen Therapy: Patient Spontanous Breathing and Patient connected to nasal cannula oxygen  Post-op Assessment: Report given to RN, Post -op Vital signs reviewed and stable and Patient moving all extremities  Post vital signs: Reviewed and stable  Last Vitals:  Vitals Value Taken Time  BP    Temp    Pulse 74 01/28/20 1452  Resp 19 01/28/20 1453  SpO2 98 % 01/28/20 1452  Vitals shown include unvalidated device data.  Last Pain:  Vitals:   01/28/20 1412  TempSrc: Oral  PainSc: 0-No pain      Patients Stated Pain Goal: 6 (38/75/64 3329)  Complications: No complications documented.

## 2020-01-29 ENCOUNTER — Inpatient Hospital Stay (HOSPITAL_COMMUNITY): Payer: Medicare Other | Admitting: Anesthesiology

## 2020-01-29 ENCOUNTER — Encounter (HOSPITAL_COMMUNITY)
Admission: EM | Disposition: A | Discharge status: Home / Self Care | Payer: Self-pay | Source: Home / Self Care | Attending: Family Medicine

## 2020-01-29 ENCOUNTER — Encounter (HOSPITAL_COMMUNITY): Payer: Self-pay | Admitting: Internal Medicine

## 2020-01-29 DIAGNOSIS — F17210 Nicotine dependence, cigarettes, uncomplicated: Secondary | ICD-10-CM | POA: Diagnosis not present

## 2020-01-29 DIAGNOSIS — Z794 Long term (current) use of insulin: Secondary | ICD-10-CM | POA: Diagnosis not present

## 2020-01-29 DIAGNOSIS — D509 Iron deficiency anemia, unspecified: Secondary | ICD-10-CM | POA: Diagnosis not present

## 2020-01-29 DIAGNOSIS — Z951 Presence of aortocoronary bypass graft: Secondary | ICD-10-CM | POA: Diagnosis not present

## 2020-01-29 DIAGNOSIS — N179 Acute kidney failure, unspecified: Secondary | ICD-10-CM | POA: Diagnosis not present

## 2020-01-29 DIAGNOSIS — K921 Melena: Secondary | ICD-10-CM | POA: Diagnosis not present

## 2020-01-29 DIAGNOSIS — R0609 Other forms of dyspnea: Secondary | ICD-10-CM | POA: Diagnosis present

## 2020-01-29 DIAGNOSIS — I10 Essential (primary) hypertension: Secondary | ICD-10-CM | POA: Diagnosis not present

## 2020-01-29 DIAGNOSIS — I2510 Atherosclerotic heart disease of native coronary artery without angina pectoris: Secondary | ICD-10-CM | POA: Diagnosis not present

## 2020-01-29 DIAGNOSIS — I679 Cerebrovascular disease, unspecified: Secondary | ICD-10-CM | POA: Diagnosis not present

## 2020-01-29 DIAGNOSIS — K2951 Unspecified chronic gastritis with bleeding: Secondary | ICD-10-CM | POA: Diagnosis not present

## 2020-01-29 DIAGNOSIS — D62 Acute posthemorrhagic anemia: Secondary | ICD-10-CM | POA: Diagnosis not present

## 2020-01-29 DIAGNOSIS — Z86010 Personal history of colonic polyps: Secondary | ICD-10-CM | POA: Diagnosis not present

## 2020-01-29 DIAGNOSIS — K2100 Gastro-esophageal reflux disease with esophagitis, without bleeding: Secondary | ICD-10-CM | POA: Diagnosis present

## 2020-01-29 DIAGNOSIS — Z888 Allergy status to other drugs, medicaments and biological substances status: Secondary | ICD-10-CM | POA: Diagnosis not present

## 2020-01-29 DIAGNOSIS — E1169 Type 2 diabetes mellitus with other specified complication: Secondary | ICD-10-CM | POA: Diagnosis not present

## 2020-01-29 DIAGNOSIS — Z833 Family history of diabetes mellitus: Secondary | ICD-10-CM | POA: Diagnosis not present

## 2020-01-29 DIAGNOSIS — Z79899 Other long term (current) drug therapy: Secondary | ICD-10-CM | POA: Diagnosis not present

## 2020-01-29 DIAGNOSIS — Z7982 Long term (current) use of aspirin: Secondary | ICD-10-CM | POA: Diagnosis not present

## 2020-01-29 DIAGNOSIS — N400 Enlarged prostate without lower urinary tract symptoms: Secondary | ICD-10-CM | POA: Diagnosis present

## 2020-01-29 DIAGNOSIS — Z20822 Contact with and (suspected) exposure to covid-19: Secondary | ICD-10-CM | POA: Diagnosis not present

## 2020-01-29 DIAGNOSIS — E538 Deficiency of other specified B group vitamins: Secondary | ICD-10-CM | POA: Diagnosis not present

## 2020-01-29 DIAGNOSIS — E785 Hyperlipidemia, unspecified: Secondary | ICD-10-CM | POA: Diagnosis not present

## 2020-01-29 DIAGNOSIS — K922 Gastrointestinal hemorrhage, unspecified: Secondary | ICD-10-CM | POA: Diagnosis present

## 2020-01-29 HISTORY — PX: COLONOSCOPY WITH PROPOFOL: SHX5780

## 2020-01-29 LAB — BPAM RBC
Blood Product Expiration Date: 202107142359
Blood Product Expiration Date: 202107212359
Blood Product Expiration Date: 202107212359
ISSUE DATE / TIME: 202107051504
ISSUE DATE / TIME: 202107051948
ISSUE DATE / TIME: 202107061132
Unit Type and Rh: 6200
Unit Type and Rh: 6200
Unit Type and Rh: 6200

## 2020-01-29 LAB — CBC
HCT: 27.6 % — ABNORMAL LOW (ref 39.0–52.0)
Hemoglobin: 8 g/dL — ABNORMAL LOW (ref 13.0–17.0)
MCH: 21.1 pg — ABNORMAL LOW (ref 26.0–34.0)
MCHC: 29 g/dL — ABNORMAL LOW (ref 30.0–36.0)
MCV: 72.8 fL — ABNORMAL LOW (ref 80.0–100.0)
Platelets: 281 10*3/uL (ref 150–400)
RBC: 3.79 MIL/uL — ABNORMAL LOW (ref 4.22–5.81)
RDW: 21.4 % — ABNORMAL HIGH (ref 11.5–15.5)
WBC: 5.7 10*3/uL (ref 4.0–10.5)
nRBC: 0 % (ref 0.0–0.2)

## 2020-01-29 LAB — TYPE AND SCREEN
ABO/RH(D): A POS
Antibody Screen: NEGATIVE
Unit division: 0
Unit division: 0
Unit division: 0

## 2020-01-29 LAB — GLUCOSE, CAPILLARY
Glucose-Capillary: 79 mg/dL (ref 70–99)
Glucose-Capillary: 116 mg/dL — ABNORMAL HIGH (ref 70–99)
Glucose-Capillary: 84 mg/dL (ref 70–99)
Glucose-Capillary: 94 mg/dL (ref 70–99)

## 2020-01-29 LAB — BASIC METABOLIC PANEL
Anion gap: 7 (ref 5–15)
BUN: 10 mg/dL (ref 8–23)
CO2: 23 mmol/L (ref 22–32)
Calcium: 8.3 mg/dL — ABNORMAL LOW (ref 8.9–10.3)
Chloride: 110 mmol/L (ref 98–111)
Creatinine, Ser: 1.24 mg/dL (ref 0.61–1.24)
GFR calc Af Amer: >60 mL/min (ref >60)
GFR calc non Af Amer: 58 mL/min — ABNORMAL LOW (ref >60)
Glucose, Bld: 68 mg/dL — ABNORMAL LOW (ref 70–99)
Potassium: 3.5 mmol/L (ref 3.5–5.1)
Sodium: 140 mmol/L (ref 135–145)

## 2020-01-29 SURGERY — COLONOSCOPY WITH PROPOFOL
Anesthesia: General

## 2020-01-29 MED ORDER — LIDOCAINE HCL (CARDIAC) PF 50 MG/5ML IV SOSY
PREFILLED_SYRINGE | INTRAVENOUS | Status: DC | PRN
  Administered 2020-01-29: 60 mg via INTRAVENOUS

## 2020-01-29 MED ORDER — ASPIRIN 81 MG PO TABS: 81.0000 mg | ORAL_TABLET | Freq: Every day | ORAL | 1 refills | Status: AC

## 2020-01-29 MED ORDER — LIDOCAINE 2% (20 MG/ML) 5 ML SYRINGE
INTRAMUSCULAR | Status: AC
  Filled 2020-01-29: qty 5

## 2020-01-29 MED ORDER — PROPOFOL 500 MG/50ML IV EMUL
INTRAVENOUS | Status: DC | PRN
  Administered 2020-01-29: 125 ug/kg/min via INTRAVENOUS
  Administered 2020-01-29: 150 ug/kg/min via INTRAVENOUS

## 2020-01-29 MED ORDER — AMLODIPINE BESYLATE 10 MG PO TABS: 10.0000 mg | ORAL_TABLET | Freq: Every day | ORAL | 5 refills | Status: AC

## 2020-01-29 MED ORDER — SODIUM CHLORIDE 0.9 % IV SOLN: INTRAVENOUS | Status: DC

## 2020-01-29 MED ORDER — LOSARTAN POTASSIUM 25 MG PO TABS: 25.0000 mg | ORAL_TABLET | Freq: Every day | ORAL | 2 refills | Status: DC

## 2020-01-29 MED ORDER — DEXTROSE 50 % IV SOLN
50.0000 mL | Freq: Once | INTRAVENOUS | Status: AC
  Administered 2020-01-29: 50 mL via INTRAVENOUS
  Filled 2020-01-29: qty 50

## 2020-01-29 MED ORDER — PROPOFOL 10 MG/ML IV BOLUS
INTRAVENOUS | Status: DC | PRN
  Administered 2020-01-29: 20 mg via INTRAVENOUS

## 2020-01-29 MED ORDER — LACTATED RINGERS IV SOLN: Freq: Once | INTRAVENOUS | Status: AC

## 2020-01-29 MED ORDER — CYANOCOBALAMIN 1000 MCG/ML IJ SOLN
1000.0000 ug | INTRAMUSCULAR | 5 refills | Status: DC
Start: 2020-02-26 — End: 2021-01-27

## 2020-01-29 MED ORDER — LACTATED RINGERS IV SOLN
INTRAVENOUS | Status: DC | PRN
Start: 2020-01-29 — End: 2020-01-29

## 2020-01-29 MED ORDER — STERILE WATER FOR IRRIGATION IR SOLN
Status: DC | PRN
  Administered 2020-01-29: 100 mL

## 2020-01-29 MED ORDER — PANTOPRAZOLE SODIUM 40 MG PO TBEC
40.0000 mg | DELAYED_RELEASE_TABLET | Freq: Every day | ORAL | 11 refills | Status: DC
Start: 2020-01-29 — End: 2020-03-23

## 2020-01-29 MED ORDER — BISACODYL 5 MG PO TBEC
10.0000 mg | DELAYED_RELEASE_TABLET | Freq: Once | ORAL | Status: AC
  Administered 2020-01-29: 10 mg via ORAL
  Filled 2020-01-29: qty 2

## 2020-01-29 NOTE — Interval H&P Note (Signed)
History and Physical Interval Note:  01/29/2020 3:17 PM  Kyle Daniel  has presented today for surgery, with the diagnosis of IDA, heme positive stool.  The various methods of treatment have been discussed with the patient and family. After consideration of risks, benefits and other options for treatment, the patient has consented to  Procedure(s): COLONOSCOPY WITH PROPOFOL (N/A) as a surgical intervention.  The patient's history has been reviewed, patient examined, no change in status, stable for surgery.  I have reviewed the patient's chart and labs.  Questions were answered to the patient's satisfaction.     Eula Listen  Patient stable overnight.  No obvious bleeding.  Hemoglobin 8.0 after 3 units packed RBCs.  Discussed reasoning behind colonoscopy today.  Only, patient may end up with an outpatient capsule study the small bowel.  I will offer the patient a diagnostic colonoscopy today. The risks, benefits, limitations, alternatives and imponderables have been reviewed with the patient. Questions have been answered. All parties are agreeable.    Further recommendations to follow.

## 2020-01-29 NOTE — Op Note (Addendum)
[facility] Patient Name: Kyle Daniel Procedure Date: 01/29/2020 3:14 PM MRN: [id number] Date of Birth: [birth year] Attending MD: Gennette Pac , MD CSN: [id number] Age: 74 Admit Type: Inpatient Procedure:                Colonoscopy Indications:              Melena, Iron deficiency anemia; negative EGD Providers:                Gennette Pac, MD, Jannett Celestine, RN, Edythe Clarity, Technician Referring MD:              Medicines:                Propofol per Anesthesia Complications:            No immediate complications. Estimated Blood Loss:     Estimated blood loss: none. Procedure:                Pre-Anesthesia Assessment:                           - Prior to the procedure, a History and Physical                            was performed, and patient medications and                            allergies were reviewed. The patient's tolerance of                            previous anesthesia was also reviewed. The risks                            and benefits of the procedure and the sedation                            options and risks were discussed with the patient.                            All questions were answered, and informed consent                            was obtained. Prior Anticoagulants: The patient has                            taken no previous anticoagulant or antiplatelet                            agents. ASA Grade Assessment: II - A patient with                            mild systemic disease. After reviewing the risks  and benefits, the patient was deemed in                            satisfactory condition to undergo the procedure.                           After obtaining informed consent, the colonoscope                            was passed under direct vision. Throughout the                            procedure, the patient's blood pressure, pulse, and                            oxygen  saturations were monitored continuously. The                            CF-[id number] ([id number]) scope was introduced through                            the anus and advanced to the the cecum, identified                            by appendiceal orifice and ileocecal valve. The                            colonoscopy was performed without difficulty. The                            patient tolerated the procedure well. The quality                            of the bowel preparation was adequate. Scope In: 3:29:59 PM Scope Out: 3:44:52 PM Scope Withdrawal Time: 0 hours 7 minutes 47 seconds  Total Procedure Duration: 0 hours 14 minutes 53 seconds  Findings:      The perianal and digital rectal examinations were normal.      The colon (entire examined portion) appeared normal.      The retroflexed view of the distal rectum and anal verge was normal and       showed no anal or rectal abnormalities. Impression:               - The entire examined colon is normal.                           - The distal rectum and anal verge are normal on                            retroflexion view. Normal-appearing terminal ileum.                            No blood in the lower GI tract.                           -  No specimens collected. Moderate Sedation:      Moderate (conscious) sedation was personally administered by an       anesthesia professional. The following parameters were monitored: oxygen       saturation, heart rate, blood pressure, respiratory rate, EKG, adequacy       of pulmonary ventilation, and response to care. Recommendation:           - Patient has a contact number available for                            emergencies. The signs and symptoms of potential                            delayed complications were discussed with the                            patient. Return to normal activities tomorrow.                            Written discharge instructions were provided to the                             patient.                           - Diabetic (ADA) diet. Outpatient video capsule                            study of the small intestine. Avoid                            antiplatelet/anticoagulation medication as much as                            possible for the time being. Follow-up on pathology                            from biopsies from yesterday's EGD when they become                            available.                           He will need close interval following of his H&H.                            From a GI standpoint, could probably be discharged                            later today. I discussed my findings and                            recommendations at length with wife, Kyle Daniel, at  [phone]. Procedure Code(s):        --- Professional ---                           [phone], Colonoscopy, flexible; diagnostic, including                            collection of specimen(s) by brushing or washing,                            when performed (separate procedure) Diagnosis Code(s):        --- Professional ---                           K92.1, Melena (includes Hematochezia)                           D50.9, Iron deficiency anemia, unspecified CPT copyright 2019 American Medical Association. All rights reserved. The codes documented in this report are preliminary and upon coder review may  be revised to meet current compliance requirements. Kyle Friendsobert M. Kyle Atkins, MD Gennette Pacobert Michael Christpher Stogsdill, MD 01/29/2020 3:49:58 PM This report has been signed electronically. Number of Addenda: 0

## 2020-01-29 NOTE — Discharge Instructions (Signed)
1)Avoid ibuprofen/Advil/Aleve/Motrin/Goody Powders/Naproxen/BC powders/Meloxicam/Diclofenac/Indomethacin and other Nonsteroidal anti-inflammatory medications as these will make you more likely to bleed and can cause stomach ulcers, can also cause Kidney problems.   2) complete abstinence from tobacco/smoking strongly advised--- you may use over-the-counter nicotine patch to help you quit smoking  3) CBC and BMP blood test every Monday for the next 3 weeks starting February 03, 2020  4) follow-up with gastroenterologist Dr. Jena Gauss for outpatient video capsule study of the small bowel/capsule endoscopy test --If your capsule endoscopy test does not show the reason for your bleeding and blood loss you may need to go to [facility] for double-balloon enteroscopy (push -pull enteroscopy)  5) there have been some adjustments to your blood pressure medications due to your kidneys  6) you will need monthly B12 injections due to low vitamin B12 levels which can contribute to your anemia

## 2020-01-29 NOTE — Anesthesia Postprocedure Evaluation (Signed)
Anesthesia Post Note  Patient: Kyle Daniel  Procedure(s) Performed: COLONOSCOPY WITH PROPOFOL (N/A )  Patient location during evaluation: PACU Anesthesia Type: General Level of consciousness: awake and alert and patient cooperative Pain management: satisfactory to patient Vital Signs Assessment: post-procedure vital signs reviewed and stable Respiratory status: spontaneous breathing Cardiovascular status: stable Postop Assessment: no apparent nausea or vomiting Anesthetic complications: no   No complications documented.   Last Vitals:  Vitals:   01/29/20 1603 01/29/20 1615  BP: 119/67   Pulse: 75 71  Resp: 15 18  Temp: 36.4 C   SpO2: 99% 98%    Last Pain:  Vitals:   01/29/20 1615  TempSrc:   PainSc: 0-No pain                 Carnell Casamento

## 2020-01-29 NOTE — Discharge Summary (Signed)
Ibrahim Mcpheeters, is a 74 y.o. male  DOB 03-04-1946  MRN [id number].  Admission date:  01/27/2020  Admitting Physician  Angle Dirusso Denton Brick, MD  Discharge Date:  01/29/2020   Primary MD  Jani Gravel, MD  Recommendations for primary care physician for things to follow:   1)Avoid ibuprofen/Advil/Aleve/Motrin/Goody Powders/Naproxen/BC powders/Meloxicam/Diclofenac/Indomethacin and other Nonsteroidal anti-inflammatory medications as these will make you more likely to bleed and can cause stomach ulcers, can also cause Kidney problems.   2) complete abstinence from tobacco/smoking strongly advised--- you may use over-the-counter nicotine patch to help you quit smoking  3) CBC and BMP blood test every Monday for the next 3 weeks starting February 03, 2020  4) follow-up with gastroenterologist Dr. Gala Romney for outpatient video capsule study of the small bowel/capsule endoscopy test --If your capsule endoscopy test does not show the reason for your bleeding and blood loss you may need to go to [facility] for double-balloon enteroscopy (push -pull enteroscopy)  5) there have been some adjustments to your blood pressure medications due to your kidneys  6) you will need monthly B12 injections due to low vitamin B12 levels which can contribute to your anemia  Admission Diagnosis  Acute GI bleeding [K92.2] Symptomatic anemia [D64.9] Gastrointestinal hemorrhage, unspecified gastrointestinal hemorrhage type [K92.2]   Discharge Diagnosis  Acute GI bleeding [K92.2] Symptomatic anemia [D64.9] Gastrointestinal hemorrhage, unspecified gastrointestinal hemorrhage type [K92.2]    Principal Problem:   Acute GI bleeding Active Problems:   CAD (coronary artery disease)--three-vessel bypass in 2007   TOBACCO ABUSE   CEREBROVASCULAR DISEASE   HTN (hypertension)   GERD (gastroesophageal reflux disease)   Type 2 diabetes mellitus with other  specified complication (HCC)   B26 deficiency   Symptomatic anemia      Past Medical History:  Diagnosis Date  . ASCVD (arteriosclerotic cardiovascular disease)    S/P CABG surgery  in 2004; cardiac cath in 2005 revealed patent grafts.  Marland Kitchen BPH (benign prostatic hypertrophy)   . Carotid artery occlusion   . Cerebrovascular disease    bilateral bruits  . DM (diabetes mellitus) ([place])    AODM-- insulin requiring for the past 58yr  . Hx of adenomatous colonic polyps   . Hyperlipidemia   . Hypertension   . Tobacco abuse    50 pack years; current 1 pack per day    Past Surgical History:  Procedure Laterality Date  . BIOPSY  01/28/2020   Procedure: BIOPSY;  Surgeon: RDaneil Dolin MD;  Location: [facility];  Service: Endoscopy;;  . CERVICAL DISCECTOMY  1996   anterior cervical spine discectomy and fusion  . CHOLECYSTECTOMY  2005  . COLONOSCOPY W/ POLYPECTOMY     4 adenomatous polyps  . CORONARY ARTERY BYPASS GRAFT    . ESOPHAGOGASTRODUODENOSCOPY  07/04/2007   Dr. FOneida Alar normal esophagus, erythema/edema in gastric antrum s/p biopsied, duodenitis.  Pathology positive for H. pylori.  . ESOPHAGOGASTRODUODENOSCOPY (EGD) WITH PROPOFOL N/A 01/28/2020   Procedure: ESOPHAGOGASTRODUODENOSCOPY (EGD) WITH PROPOFOL;  Surgeon: RDaneil Dolin MD;  Location: [facility]  END[facility];  Service: Endoscopy;  Laterality: N/A;    HPI  from the history and physical done on the day of admission:    Yahmir Sokolov  is a 74 y.o. male with past medical history relevant for ongoing tobacco abuse, history of CAD with prior three-vessel CABG in 2007, HTN, obesity, DM who presents to the ED with concerns about weakness, dizziness, chest discomfort and dyspnea on exertion -In the ED patient is found to have a hemoglobin of 5.6, -No hematemesis, no frank hematochezia or bright red blood per rectum, -Patient denies NSAID use --EDP discussed case with on-call GI physician recommended IV Protonix and possible endoluminal  evaluation in a.m. -- -Patient apparently had an episode of GI bleed back in October 2020 at the [facility] in [place], apparently patient required 5 units of packed cells at the time--he did have an EGD at that time according to patient at that time they found a mucosal tear which may explain his bleeding, colonoscopy was not completed at that time apparently due to poor prep - -Initial troponin is not elevated patient has no further chest discomfort -Creatinine is 1.4 it is unclear if this is CKD versus AKI -Chest x-ray in the ED without acute findings   Hospital Course:   Brief Summary:- 74 y.o.malewith past medical history relevant for ongoing tobacco abuse, history of CAD with prior three-vessel CABG in 2007, HTN, obesity, DM with history of recurrent GI bleeds requiring transfusions admitted on 01/27/2020 with hemoglobin of 5.6 in the setting of dark stools and Hemoccult positive stools -EGD on 01/28/2020 was unrevealing -Colonoscopy on 01/29/2020 unrevealing -Follow-up with gastroenterologist Dr. Gala Romney for outpatient video capsule study of the small bowel/capsule endoscopy test --If  capsule endoscopy test is unrevealing, will consider referral letter [facility] for double-balloon enteroscopy (push -pull enteroscopy)   -Patient had GI bleed with severe symptomatic anemia and hemoglobin down to 4.0 in October 2020 requiring 5 units of packed cells -Patient presented again this time with a GI bleed with very severe symptomatic anemia with hemoglobin down to 5.6 requiring at least 3 units of packed cells this admission -Given history of CAD and patient's tendency to have very severe symptomatic anemia with hemoglobin in the 4 to 5 range it was necessary to complete endoluminal evaluation as inpatient--- please note the patient attempted to have a colonoscopy on 12/05/2019 unsuccessfully due to poor prep -During this hospital stay despite drinking the usual colonoscopy prep patient still  required multiple doses of enemas and additional laxatives in order to have close to adequate colon prep to allow for proper colonic examination during colonoscopy on 01/29/2020 -This could not have been done successfully as an outpatient actually failed colonoscopy attempts from 03/06/2020 demonstrates --Patient met inpatient criteria as outlined above    A/p 1)Acute GI bleed--holdaspirin,  -GI consult appreciated, EGD as noted below, ---Patient had EGD in October 2020 at the [facility] in [place] after acute GI bleed requiring 5 units of packed red blood cells at the time---records from [place] hospital reviewed -In October 2020 at the [facility] hospital) patient's hemoglobin dropped down to 4.0--he did require 5 units of packed cells -He had EGD on 04/29/2019 with single duodenal AVM found and cauterized with APC--H. pylori in order biopsies were negative -Attempted colonoscopy on 12/05/2019 which is aborted due to poor colon prep----polypectomy with tubular adenoma without any evidence of high-grade dysplasia --This admission here at [facility] patient underwent:- EGD on 01/28/2020 which was unrevealing -Colonoscopy on 01/29/2020  unrevealing -Follow-up with gastroenterologist Dr. Gala Romney for outpatient video capsule study of the small bowel/capsule endoscopy test --If  capsule endoscopy test is unrevealing, will consider referral letter [facility] for double-balloon enteroscopy (push -pull enteroscopy)   2)AcuteSymptomaticAnemia---secondary to #1 above in the setting of underlying B12 deficiency -Patient presented with dyspnea on exertion, chest discomfort, dizziness and fatigue with weakness -Symptoms have improved significantly after transfusion of 3 units of PBCS --hemoglobin up to 8.0 from 5.6 after transfusion of 3 units of PRBCs -B12 is low at 174,folate is normal at 15.3 -LFTs arenot elevated -Ferritin is very very low at 2, serum iron is very low at 20 with iron saturation of  only 4 and a TIBC of 542 -Given B12 injection --patient will need monthly IM injections --Management as above #1  3)H/o CAD--three-vessel bypass back in 2007---try to keep hgbclose to 9 No further chest discomfort, no further dizzy spells after transfusion of 2 units of packed cells -EKG is nonacute,  echocardiogram on 01/28/2020 with preserved EF of 60 to 65% without wall motion abnormalities or diastolic dysfunction -Troponin is not elevated(8)  4)DM2--- A1c 6.6 reflecting excellent DM control PTA, resume home insulin regimen  5)HTN--stable at this time resume amlodipine and Minipress, decrease losartan to 25 mg daily due to kidney concerns, increase hydralazine 100 mg 3 times daily  6)TobaccoAbuse---patient is not interested in smoking cessation  7)AKI----- creatinine on admission=1.44, baseline creatinine = unknown,  -Creatinine down to 1.24 GFR is over 60 ---renally adjust medications, avoid nephrotoxic agents / dehydration / hypotension  Disposition --discharge home with family - Code Status : Full code  Family Communication:    (patient is alert, awake and coherent)  -Discussed with patient's daughter  Consults  :  Gi  Procedures:- EGD on 01/28/2020 without definite etiology for patient's symptomatic anemia and GI bleed -Colonoscopy 01/29/2020 unrevealing  Discharge Condition: stable  Follow UP   Follow-up Information    Rourk, Cristopher Estimable, MD. Schedule an appointment as soon as possible for a visit.   Specialty: Gastroenterology Contact information: [address] [place] [address] [phone]                Consults obtained -GI  Diet and Activity recommendation:  As advised  Discharge Instructions    Discharge Instructions    Call MD for:  difficulty breathing, headache or visual disturbances   Complete by: As directed    Call MD for:  persistant dizziness or light-headedness   Complete by: As directed    Call MD for:   persistant nausea and vomiting   Complete by: As directed    Call MD for:  severe uncontrolled pain   Complete by: As directed    Call MD for:  temperature >100.4   Complete by: As directed    Diet - low sodium heart healthy   Complete by: As directed    Diet Carb Modified   Complete by: As directed    Discharge instructions   Complete by: As directed    1)Avoid ibuprofen/Advil/Aleve/Motrin/Goody Powders/Naproxen/BC powders/Meloxicam/Diclofenac/Indomethacin and other Nonsteroidal anti-inflammatory medications as these will make you more likely to bleed and can cause stomach ulcers, can also cause Kidney problems.   2) complete abstinence from tobacco/smoking strongly advised--- you may use over-the-counter nicotine patch to help you quit smoking  3) CBC and BMP blood test every Monday for the next 3 weeks starting February 03, 2020  4) follow-up with gastroenterologist Dr. Gala Romney for outpatient video capsule study of the small  bowel/capsule endoscopy test --If your capsule endoscopy test does not show the reason for your bleeding and blood loss you may need to go to [facility] for double-balloon enteroscopy (push -pull enteroscopy)  5) there have been some adjustments to your blood pressure medications due to your kidneys  6) you will need monthly B12 injections due to low vitamin B12 levels which can contribute to your anemia   Increase activity slowly   Complete by: As directed         Discharge Medications     Allergies as of 01/29/2020      Reactions   Lisinopril Cough      Medication List    TAKE these medications   albuterol 108 (90 Base) MCG/ACT inhaler Commonly known as: VENTOLIN HFA Inhale 1-2 puffs into the lungs every 6 (six) hours as needed for wheezing or shortness of breath.   amitriptyline 25 MG tablet Commonly known as: ELAVIL Take 25 mg by mouth at bedtime.   amLODipine 10 MG tablet Commonly known as: NORVASC Take 1 tablet (10 mg total) by mouth  daily. What changed: when to take this   aspirin 81 MG tablet Take 1 tablet (81 mg total) by mouth daily with breakfast. What changed: when to take this   cholecalciferol 25 MCG (1000 UNIT) tablet Commonly known as: VITAMIN D3 Take 2,000 Units by mouth every morning.   cyanocobalamin 1000 MCG/ML injection Commonly known as: (VITAMIN B-12) Inject 1 mL (1,000 mcg total) into the muscle every 30 (thirty) days. Start taking on: February 26, 2020   dorzolamide-timolol 22.3-6.8 MG/ML ophthalmic solution Commonly known as: COSOPT Place 1 drop into the right eye 2 (two) times daily.   finasteride 5 MG tablet Commonly known as: PROSCAR Take 5 mg by mouth at bedtime.   fish oil-omega-3 fatty acids 1000 MG capsule Take 3 g by mouth 2 (two) times daily.   gabapentin 100 MG capsule Commonly known as: NEURONTIN Take 200 mg by mouth 2 (two) times daily.   insulin NPH Human 100 UNIT/ML injection Commonly known as: NOVOLIN N Inject 6 Units into the skin daily as needed (for blood sugar levels over 250).   Lantus 100 UNIT/ML injection Generic drug: insulin glargine Inject 30 Units into the skin in the morning and at bedtime.   losartan 25 MG tablet Commonly known as: COZAAR Take 1 tablet (25 mg total) by mouth daily. What changed: how much to take   nitroGLYCERIN 0.4 MG SL tablet Commonly known as: NITROSTAT Place 1 tablet (0.4 mg total) under the tongue every 5 (five) minutes as needed for chest pain.   pantoprazole 40 MG tablet Commonly known as: Protonix Take 1 tablet (40 mg total) by mouth daily.   pioglitazone 45 MG tablet Commonly known as: ACTOS Take 45 mg by mouth every morning.   pravastatin 80 MG tablet Commonly known as: PRAVACHOL Take 1 tablet (80 mg total) by mouth daily. What changed: when to take this   prazosin 2 MG capsule Commonly known as: MINIPRESS Take 2 mg by mouth at bedtime.   torsemide 20 MG tablet Commonly known as: DEMADEX Take 20 mg by mouth  daily.   traZODone 50 MG tablet Commonly known as: DESYREL Take 50 mg by mouth at bedtime.       Major procedures and Radiology Reports - PLEASE review detailed and final reports for all details, in brief -    DG Chest 2 View  Result Date: 01/27/2020 CLINICAL DATA:  Chest pain  EXAM: CHEST - 2 VIEW COMPARISON:  10/22/2013 FINDINGS: Status post median sternotomy. Both lungs are clear. The visualized skeletal structures are unremarkable. IMPRESSION: No acute abnormality of the lungs. Electronically Signed   By: Eddie Candle M.D.   On: 01/27/2020 13:19   ECHOCARDIOGRAM COMPLETE  Result Date: 01/28/2020    ECHOCARDIOGRAM REPORT   Patient Name:   WANE MOLLETT Date of Exam: 01/28/2020 Medical Rec #:  [id number]  Height:       73.0 in Accession #:    [id number] Weight:       272.5 lb Date of Birth:  1945/08/14  BSA:          2.454 m Patient Age:    71 years   BP:           125/66 mmHg Patient Gender: M          HR:           71 bpm. Exam Location:  [facility] Procedure: 2D Echo, Cardiac Doppler and Color Doppler Indications:    Dyspnea 786.09/R06.00  History:        Patient has prior history of Echocardiogram examinations, most                 recent 05/31/2019. Risk Factors:Diabetes, Hypertension and                 Dyslipidemia. TOBACCO ABUSE,ASCVD (arteriosclerotic                 cardiovascular disease) (From Hx).  Sonographer:    Alvino Chapel RCS Referring Phys: [phone] Presly Steinruck IMPRESSIONS  1. Left ventricular ejection fraction, by estimation, is 60 to 65%. The left ventricle has normal function. The left ventricle has no regional wall motion abnormalities. There is moderate left ventricular hypertrophy. Left ventricular diastolic parameters were normal.  2. Right ventricular systolic function is normal. The right ventricular size is normal. There is normal pulmonary artery systolic pressure. The estimated right ventricular systolic pressure is 35.3 mmHg.  3. Left atrial size was mild to moderately  dilated.  4. Right atrial size was upper normal.  5. The mitral valve is grossly normal. There is an area of severe nodular annular calcification between the mitral and aortic valves without obvious impact on LVOT flow. Trivial mitral valve regurgitation.  6. The aortic valve is tricuspid. The left coronary cusp is restricted, but there is no stenosis. Aortic valve regurgitation is not visualized. Aortic valve mean gradient measures 7.0 mmHg.  7. The inferior vena cava is dilated in size with >50% respiratory variability, suggesting right atrial pressure of 8 mmHg. FINDINGS  Left Ventricle: Left ventricular ejection fraction, by estimation, is 60 to 65%. The left ventricle has normal function. The left ventricle has no regional wall motion abnormalities. Definity contrast agent was given IV to delineate the left ventricular  endocardial borders. The left ventricular internal cavity size was normal in size. There is moderate left ventricular hypertrophy. Left ventricular diastolic parameters were normal. Right Ventricle: The right ventricular size is normal. No increase in right ventricular wall thickness. Right ventricular systolic function is normal. There is normal pulmonary artery systolic pressure. The tricuspid regurgitant velocity is 2.63 m/s, and  with an assumed right atrial pressure of 8 mmHg, the estimated right ventricular systolic pressure is 29.9 mmHg. Left Atrium: Left atrial size was mild to moderately dilated. Right Atrium: Right atrial size was upper normal. Pericardium: There is no evidence of pericardial effusion. Mitral Valve: The mitral valve  is grossly normal. Severe mitral annular calcification. Trivial mitral valve regurgitation. Tricuspid Valve: The tricuspid valve is grossly normal. Tricuspid valve regurgitation is trivial. Aortic Valve: The aortic valve is tricuspid. Aortic valve regurgitation is not visualized. Aortic valve mean gradient measures 7.0 mmHg. Aortic valve peak gradient  measures 14.3 mmHg. Aortic valve area, by VTI measures 2.07 cm. Pulmonic Valve: The pulmonic valve was grossly normal. Pulmonic valve regurgitation is trivial. Aorta: The aortic root is normal in size and structure. Venous: The inferior vena cava is dilated in size with greater than 50% respiratory variability, suggesting right atrial pressure of 8 mmHg. IAS/Shunts: No atrial level shunt detected by color flow Doppler.  LEFT VENTRICLE PLAX 2D LVIDd:         4.60 cm  Diastology LVIDs:         2.88 cm  LV e' lateral:   13.70 cm/s LV PW:         1.31 cm  LV E/e' lateral: 7.1 LV IVS:        1.43 cm  LV e' medial:    8.70 cm/s LVOT diam:     1.80 cm  LV E/e' medial:  11.2 LV SV:         84 LV SV Index:   34 LVOT Area:     2.54 cm  RIGHT VENTRICLE RV S prime:     11.10 cm/s TAPSE (M-mode): 2.1 cm LEFT ATRIUM              Index       RIGHT ATRIUM           Index LA diam:        4.80 cm  1.96 cm/m  RA Area:     23.70 cm LA Vol (A2C):   118.0 ml 48.09 ml/m RA Volume:   79.00 ml  32.19 ml/m LA Vol (A4C):   94.9 ml  38.67 ml/m LA Biplane Vol: 109.0 ml 44.42 ml/m  AORTIC VALVE AV Area (Vmax):    2.05 cm AV Area (Vmean):   2.11 cm AV Area (VTI):     2.07 cm AV Vmax:           189.00 cm/s AV Vmean:          124.000 cm/s AV VTI:            0.405 m AV Peak Grad:      14.3 mmHg AV Mean Grad:      7.0 mmHg LVOT Vmax:         152.00 cm/s LVOT Vmean:        103.000 cm/s LVOT VTI:          0.329 m LVOT/AV VTI ratio: 0.81  AORTA Ao Root diam: 3.30 cm MITRAL VALVE               TRICUSPID VALVE MV Area (PHT): 3.81 cm    TR Peak grad:   27.7 mmHg MV Decel Time: 199 msec    TR Vmax:        263.00 cm/s MR Peak grad: 132.2 mmHg MR Mean grad: 82.0 mmHg    SHUNTS MR Vmax:      575.00 cm/s  Systemic VTI:  0.33 m MR Vmean:     415.0 cm/s   Systemic Diam: 1.80 cm MV E velocity: 97.30 cm/s MV A velocity: 74.60 cm/s MV E/A ratio:  1.30 Rozann Lesches MD Electronically signed by Rozann Lesches MD Signature Date/Time: 01/28/2020/12:58:20 PM  Final     Micro Results   Recent Results (from the past 240 hour(s))  SARS Coronavirus 2 by RT PCR (hospital order, performed in [facility] hospital lab) Nasopharyngeal Nasopharyngeal Swab     Status: None   Collection Time: 01/27/20  7:00 PM   Specimen: Nasopharyngeal Swab  Result Value Ref Range Status   SARS Coronavirus 2 NEGATIVE NEGATIVE Final    Comment: (NOTE) SARS-CoV-2 target nucleic acids are NOT DETECTED.  The SARS-CoV-2 RNA is generally detectable in upper and lower respiratory specimens during the acute phase of infection. The lowest concentration of SARS-CoV-2 viral copies this assay can detect is 250 copies / mL. A negative result does not preclude SARS-CoV-2 infection and should not be used as the sole basis for treatment or other patient management decisions.  A negative result may occur with improper specimen collection / handling, submission of specimen other than nasopharyngeal swab, presence of viral mutation(s) within the areas targeted by this assay, and inadequate number of viral copies (<250 copies / mL). A negative result must be combined with clinical observations, patient history, and epidemiological information.  Fact Sheet for Patients:   StrictlyIdeas.no  Fact Sheet for Healthcare Providers: BankingDealers.co.za  This test is not yet approved or  cleared by the [place] FDA and has been authorized for detection and/or diagnosis of SARS-CoV-2 by FDA under an Emergency Use Authorization (EUA).  This EUA will remain in effect (meaning this test can be used) for the duration of the COVID-19 declaration under Section 564(b)(1) of the Act, 21 U.S.C. section 360bbb-3(b)(1), unless the authorization is terminated or revoked sooner.  Performed at [facility], [address] [place], [place] [address]        Today   Subjective    Rudolpho Claxton today has no new concerns -Able to eat after  colonoscopy, wife and daughter at bedside, questions answered  Patient had GI bleed with severe symptomatic anemia and hemoglobin down to 4.0 in October 2020 requiring 5 units of packed cells -Patient presented again this time with a GI bleed with very severe symptomatic anemia with hemoglobin down to 5.6 requiring at least 3 units of packed cells this admission -Given history of CAD and patient's tendency to have very severe symptomatic anemia with hemoglobin in the 4 to 5 range it was necessary to complete endoluminal evaluation as inpatient--- please note the patient attempted to have a colonoscopy on 12/05/2019 unsuccessfully due to poor prep -During this hospital stay despite drinking the usual colonoscopy prep patient still required multiple doses of enemas and additional laxatives in order to have close to adequate colon prep to allow for proper colonic examination during colonoscopy on 01/29/2020 -This could not have been done successfully as an outpatient actually failed colonoscopy attempts from 03/06/2020 demonstrates --Patient met inpatient criteria as outlined above         Patient has been seen and examined prior to discharge   Objective   Blood pressure (!) 146/69, pulse 69, temperature 98.1 F (36.7 C), temperature source Oral, resp. rate 18, height 6' 1"  (1.854 m), weight 123.6 kg, SpO2 100 %.   Intake/Output Summary (Last 24 hours) at 01/29/2020 1823 Last data filed at 01/29/2020 1614 Gross per 24 hour  Intake 470 ml  Output 1350 ml  Net -880 ml    Exam Physical Examination: General appearance - alert,obeseappearing, and in no distress  Mental status - alert, oriented to person, place, and time,  Eyes - sclera anicteric Neck - supple,  no JVD elevation , Chest - clear to auscultation bilaterally, symmetrical air movement,  Heart - S1 and S2 normal, regular,prior CABG scar Abdomen - soft, nontender, nondistended, +BS Neurological - screening mental status exam normal,  neck supple without rigidity, cranial nerves II through XII intact, DTR's normal and symmetric Extremities -1+ pittingpedal edema noted, intact peripheral pulses  Skin - warm, dry   Data Review   CBC w Diff:  Lab Results  Component Value Date   WBC 5.7 01/29/2020   HGB 8.0 (L) 01/29/2020   HCT 27.6 (L) 01/29/2020   PLT 281 01/29/2020    CMP:  Lab Results  Component Value Date   NA 140 01/29/2020   K 3.5 01/29/2020   CL 110 01/29/2020   CO2 23 01/29/2020   BUN 10 01/29/2020   CREATININE 1.24 01/29/2020   PROT 6.9 01/27/2020   ALBUMIN 3.7 01/27/2020   BILITOT 0.2 (L) 01/27/2020   ALKPHOS 35 (L) 01/27/2020   AST 12 (L) 01/27/2020   ALT 13 01/27/2020  . Patient had GI bleed with severe symptomatic anemia and hemoglobin down to 4.0 in October 2020 requiring 5 units of packed cells -Patient presented again this time with a GI bleed with very severe symptomatic anemia with hemoglobin down to 5.6 requiring at least 3 units of packed cells this admission -Given history of CAD and patient's tendency to have very severe symptomatic anemia with hemoglobin in the 4 to 5 range it was necessary to complete endoluminal evaluation as inpatient--- please note the patient attempted to have a colonoscopy on 12/05/2019 unsuccessfully due to poor prep -During this hospital stay despite drinking the usual colonoscopy prep patient still required multiple doses of enemas and additional laxatives in order to have close to adequate colon prep to allow for proper colonic examination during colonoscopy on 01/29/2020 -This could not have been done successfully as an outpatient actually failed colonoscopy attempts from 03/06/2020 demonstrates --Patient met inpatient criteria as outlined above   Total Discharge time is about 33 minutes  Roxan Hockey M.D on 01/29/2020 at 6:23 PM  Go to [email or web address] -  for contact info  [facility] - Office  [phone]

## 2020-01-29 NOTE — Anesthesia Procedure Notes (Signed)
Date/Time: 01/29/2020 3:22 PM Performed by: Franco Nones, CRNA Pre-anesthesia Checklist: Patient identified, Emergency Drugs available, Suction available, Timeout performed and Patient being monitored Patient Re-evaluated:Patient Re-evaluated prior to induction Oxygen Delivery Method: Nasal Cannula

## 2020-01-29 NOTE — Plan of Care (Signed)

## 2020-01-29 NOTE — Progress Notes (Signed)
Patient completed all of prep yesterday. Output clear. Received enemas this morning. NPO except sips with meds. Hgb 8. Discussed again risks and benefits of colonoscopy. He is agreeable and ready to proceed. Placed orders for colonoscopy with Propofol today by Dr. Rourk.  

## 2020-01-29 NOTE — H&P (View-Only) (Signed)
Patient completed all of prep yesterday. Output clear. Received enemas this morning. NPO except sips with meds. Hgb 8. Discussed again risks and benefits of colonoscopy. He is agreeable and ready to proceed. Placed orders for colonoscopy with Propofol today by Dr. Jena Gauss.

## 2020-01-29 NOTE — Anesthesia Preprocedure Evaluation (Signed)
Anesthesia Evaluation  Patient identified by MRN, date of birth, ID band Patient awake    Reviewed: Allergy & Precautions, NPO status , Patient's Chart, lab work & pertinent test results  Airway Mallampati: III  TM Distance: >3 FB Neck ROM: Full    Dental no notable dental hx. (+) Loose, Missing, Dental Advisory Given   Pulmonary Current Smoker and Patient abstained from smoking.,    Pulmonary exam normal breath sounds clear to auscultation       Cardiovascular Exercise Tolerance: Good METS: 3 - Mets hypertension, Pt. on medications + CAD and + CABG  Normal cardiovascular exam Rhythm:Regular Rate:Normal - Systolic murmurs, - Diastolic murmurs, - Friction Rub, - Carotid Bruit, - Peripheral Edema and - Systolic Click 1. Left ventricular ejection fraction, by estimation, is 60 to 65%. The  left ventricle has normal function. The left ventricle has no regional  wall motion abnormalities. There is moderate left ventricular hypertrophy.  Left ventricular diastolic  parameters were normal.  2. Right ventricular systolic function is normal. The right ventricular  size is normal. There is normal pulmonary artery systolic pressure. The  estimated right ventricular systolic pressure is 35.7 mmHg.  3. Left atrial size was mild to moderately dilated.  4. Right atrial size was upper normal.  5. The mitral valve is grossly normal. There is an area of severe nodular  annular calcification between the mitral and aortic valves without obvious  impact on LVOT flow. Trivial mitral valve regurgitation.  6. The aortic valve is tricuspid. The left coronary cusp is restricted,  but there is no stenosis. Aortic valve regurgitation is not visualized.  Aortic valve mean gradient measures 7.0 mmHg.  7. The inferior vena cava is dilated in size with >50% respiratory  variability, suggesting right atrial pressure of 8 mmHg.   27-Jan-2020 12:42:22  [facility] ROUTINE RECORD Normal sinus rhythm Nonspecific ST and T wave abnormality Abnormal ECG No significant change since last tracing Confirmed by Jacalyn Lefevre [phone]) on 01/27/2020 2:04:25 PM   Neuro/Psych    GI/Hepatic Neg liver ROS, GERD  Medicated and Controlled,Acute GI bleeding   Endo/Other  diabetes, Well Controlled, Type 2, Insulin Dependent  Renal/GU Renal InsufficiencyRenal disease     Musculoskeletal  (+) Arthritis ,   Abdominal   Peds  Hematology  (+) anemia ,   Anesthesia Other Findings Patient's hemodynamics are currently stable -Patient is without chest pain or hypoxia -EKG with normal sinus rhythm -Echocardiogram today 01/28/2020 is with EF of 60 to 65% without wall motion abnormalities or diastolic dysfunction  --While it is desirable in patients with CAD and anemia try to get hemoglobin close to 9 , hemoglobin less than 9 in an otherwise hemodynamically stable patient with no acute cardiovascular or pulmonary/respiratory findings should not be a contraindication endoscopy on the anesthesia.  --Patient hemoglobin is currently 7 -Patient's hemoglobin does not have to be at 9 for him to undergo endoscopy under anesthesia as long as patient is hemodynamically stable and no other acute findings  --I Recommend patient be allowed to undergo endoscopic evaluation at this time  -I re-evaluated patient and found him to be stable, again I recommend patient be allowed to undergo endoluminal evaluation under anesthesia at this time  -His current hemoglobin levels should not be a contraindication to endoscopy with anesthesia  Shon Hale, MD   Reproductive/Obstetrics  Anesthesia Physical  Anesthesia Plan  ASA: IV  Anesthesia Plan: General   Post-op Pain Management:    Induction: Intravenous  PONV Risk Score and Plan: TIVA  Airway Management Planned: Nasal Cannula,  Natural Airway and Simple Face Mask  Additional Equipment:   Intra-op Plan:   Post-operative Plan: Possible Post-op intubation/ventilation  Informed Consent: I have reviewed the patients History and Physical, chart, labs and discussed the procedure including the risks, benefits and alternatives for the proposed anesthesia with the patient or authorized representative who has indicated his/her understanding and acceptance.     Dental advisory given  Plan Discussed with: CRNA and Surgeon  Anesthesia Plan Comments:         Anesthesia Quick Evaluation

## 2020-01-29 NOTE — Transfer of Care (Signed)
Immediate Anesthesia Transfer of Care Note  Patient: Kyle Daniel  Procedure(s) Performed: COLONOSCOPY WITH PROPOFOL (N/A )  Patient Location: PACU  Anesthesia Type:General  Level of Consciousness: awake, alert  and patient cooperative  Airway & Oxygen Therapy: Patient Spontanous Breathing  Post-op Assessment: Report given to RN and Post -op Vital signs reviewed and stable  Post vital signs: Reviewed and stable  Last Vitals:  Vitals Value Taken Time  BP 119/67 01/29/20 1603  Temp 97.6   Pulse 73 01/29/20 1604  Resp 15 01/29/20 1604  SpO2 99 % 01/29/20 1604  Vitals shown include unvalidated device data.  Last Pain:  Vitals:   01/29/20 1545  TempSrc:   PainSc: 0-No pain      Patients Stated Pain Goal: 6 (01/28/20 1412)  Complications: No complications documented.

## 2020-01-30 ENCOUNTER — Other Ambulatory Visit: Payer: Self-pay

## 2020-01-30 ENCOUNTER — Telehealth: Payer: Self-pay | Admitting: Gastroenterology

## 2020-01-30 ENCOUNTER — Encounter: Payer: Self-pay | Admitting: Internal Medicine

## 2020-01-30 DIAGNOSIS — K922 Gastrointestinal hemorrhage, unspecified: Secondary | ICD-10-CM

## 2020-01-30 LAB — SURGICAL PATHOLOGY

## 2020-01-30 NOTE — Telephone Encounter (Signed)
PATIENT SCHEDULED  °

## 2020-01-30 NOTE — Telephone Encounter (Signed)
Noted. Spoke with pt. Pt is aware that he needs to have labs completed in 1 week. Lab ordered placed and mailed to pt.

## 2020-01-30 NOTE — Telephone Encounter (Signed)
Please arrange CBC in 1 week. Patient was discharged yesterday after GI bleed. Please arrange follow-up with either Kristen or myself in 3-4 weeks.

## 2020-01-31 ENCOUNTER — Encounter (HOSPITAL_COMMUNITY): Payer: Self-pay | Admitting: Internal Medicine

## 2020-01-31 ENCOUNTER — Encounter: Payer: Self-pay | Admitting: Internal Medicine

## 2020-02-03 LAB — CBC WITH DIFFERENTIAL/PLATELET
Absolute Monocytes: 255 cells/uL (ref 200–950)
Basophils Absolute: 31 cells/uL (ref 0–200)
Basophils Relative: 0.6 %
Eosinophils Absolute: 194 cells/uL (ref 15–500)
Eosinophils Relative: 3.8 %
HCT: 30.4 % — ABNORMAL LOW (ref 38.5–50.0)
Hemoglobin: 8.9 g/dL — ABNORMAL LOW (ref 13.2–17.1)
Lymphs Abs: 1688 cells/uL (ref 850–3900)
MCH: 21.4 pg — ABNORMAL LOW (ref 27.0–33.0)
MCHC: 29.3 g/dL — ABNORMAL LOW (ref 32.0–36.0)
MCV: 73.1 fL — ABNORMAL LOW (ref 80.0–100.0)
MPV: 11 fL (ref 7.5–12.5)
Monocytes Relative: 5 %
Neutro Abs: 2933 cells/uL (ref 1500–7800)
Neutrophils Relative %: 57.5 %
Platelets: 324 10*3/uL (ref 140–400)
RBC: 4.16 10*6/uL — ABNORMAL LOW (ref 4.20–5.80)
RDW: 21.8 % — ABNORMAL HIGH (ref 11.0–15.0)
Total Lymphocyte: 33.1 %
WBC: 5.1 10*3/uL (ref 3.8–10.8)

## 2020-02-05 ENCOUNTER — Telehealth: Payer: Self-pay | Admitting: *Deleted

## 2020-02-05 NOTE — Telephone Encounter (Signed)
Pt would like you to call him when you get a chance.  He thinks he is supposed to have labs drawn weekly for 2 more weeks.  [phone]

## 2020-02-05 NOTE — Telephone Encounter (Signed)
Spoke with pt. Pt went to the lab before he received his lab orders in the mail. Pt can disregard lab orders received.

## 2020-02-10 NOTE — Telephone Encounter (Signed)
LL received a call from [employer] Bonita Quin), pt went to the lab this morning 02/07/2020, to repeat blood work. Per Ab note on 02/04/20, Hgb improving s/p hospitalization. Will see him end of July. Pt was notified this on 02/05/20.  Spoke with pts spouse and she is aware of AB recommendations and pt will f.u at his apt at the end of July, 2021.

## 2020-02-19 ENCOUNTER — Encounter: Payer: Self-pay | Admitting: Gastroenterology

## 2020-02-19 ENCOUNTER — Telehealth: Payer: Self-pay | Admitting: *Deleted

## 2020-02-19 ENCOUNTER — Encounter: Payer: Self-pay | Admitting: *Deleted

## 2020-02-19 ENCOUNTER — Ambulatory Visit: Payer: Medicare Other | Admitting: Gastroenterology

## 2020-02-19 ENCOUNTER — Other Ambulatory Visit: Payer: Self-pay

## 2020-02-19 DIAGNOSIS — D509 Iron deficiency anemia, unspecified: Secondary | ICD-10-CM | POA: Insufficient documentation

## 2020-02-19 NOTE — Telephone Encounter (Signed)
Checked [facility] website and per website for code [id number] givens capsule study Notification/Prior Authorization not required for this service.

## 2020-02-19 NOTE — Patient Instructions (Signed)
We are arranging a capsule study in the near future.  Please have blood work done today.  Further recommendations to follow!   It was a pleasure to see you today. I want to create trusting relationships with patients to provide genuine, compassionate, and quality care. I value your feedback. If you receive a survey regarding your visit,  I greatly appreciate you taking time to fill this out.   Gelene Mink, PhD, ANP-BC [facility]

## 2020-02-19 NOTE — Progress Notes (Signed)
Referring Provider: Pearson Grippe, MD Primary Care Physician:  Pearson Grippe, MD Primary GI: Dr. Marletta Lor (previously Dr. Darrick Penna)  Chief Complaint  Patient presents with  . Hospitalization Follow-up    thinks he has had some dark stool    HPI:   Kyle Daniel is a 74 y.o. male presenting today with a history of profound anemia and heme positive stool July 2021, undergoing colonoscopy and EGD during admission without obvious source for bleeding. Negative H.pylori on path. Hgb 5.6 on admission, receiving blood transfusion (3 units PRBCs). Hgb 2 weeks ago post-discharge was 8.9. Interestingly, he was also hospitalized in Oct 2020 at outside hospital for GI bleed with Hgb of 4.2 s/p 5 units PRBCs. Some question of melena historically. Remote history of H.pylori in 2008. Negative H.pylori on EGD this admission.    Sometimes with exerting himself, will feel like his chest is burning. Gets under a/c and it clears up. Sometimes associated SOB. Cardiology appt upcoming. Sometimes feels tired. Appetite is good. No NSAIDs other than 81 mg aspirin. No anticoagulation. Denies abdominal pain, N/V. No hematochezia. Stool is dark but not black at times. No oral iron. No dysphagia. GERD controlled on Protonix. No changes in bowel habits.   Past Medical History:  Diagnosis Date  . ASCVD (arteriosclerotic cardiovascular disease)    S/P CABG surgery  in 2004; cardiac cath in 2005 revealed patent grafts.  Marland Kitchen BPH (benign prostatic hypertrophy)   . Carotid artery occlusion   . Cerebrovascular disease    bilateral bruits  . DM (diabetes mellitus) (HCC)    AODM-- insulin requiring for the past 13yrs  . Hx of adenomatous colonic polyps   . Hyperlipidemia   . Hypertension   . Tobacco abuse    50 pack years; current 1 pack per day    Past Surgical History:  Procedure Laterality Date  . BIOPSY  01/28/2020   Procedure: BIOPSY;  Surgeon: Corbin Ade, MD;  Location: [facility];  Service: Endoscopy;;  .  CERVICAL DISCECTOMY  1996   anterior cervical spine discectomy and fusion  . CHOLECYSTECTOMY  2005  . COLONOSCOPY W/ POLYPECTOMY     4 adenomatous polyps  . COLONOSCOPY WITH PROPOFOL N/A 01/29/2020   normal colon, normal appearing TI.   Marland Kitchen CORONARY ARTERY BYPASS GRAFT    . ESOPHAGOGASTRODUODENOSCOPY  07/04/2007   Dr. Darrick Penna; normal esophagus, erythema/edema in gastric antrum s/p biopsied, duodenitis.  Pathology positive for H. pylori.  . ESOPHAGOGASTRODUODENOSCOPY (EGD) WITH PROPOFOL N/A 01/28/2020    mild erosive reflux esophagitis, s/p biopsy of gastric mucosa, normal duodenum overall except for minimally eroded second portion of mucosa s/p biopsy. Pathology: slight chronic gastritis, negative H.pylori, benign duodenal mucosa, no sprue.     Current Outpatient Medications  Medication Sig Dispense Refill  . albuterol (VENTOLIN HFA) 108 (90 Base) MCG/ACT inhaler Inhale 1-2 puffs into the lungs every 6 (six) hours as needed for wheezing or shortness of breath.     Marland Kitchen amitriptyline (ELAVIL) 25 MG tablet Take 25 mg by mouth at bedtime.    Marland Kitchen amLODipine (NORVASC) 10 MG tablet Take 1 tablet (10 mg total) by mouth daily. 30 tablet 5  . aspirin 81 MG tablet Take 1 tablet (81 mg total) by mouth daily with breakfast. 30 tablet 1  . cholecalciferol (VITAMIN D3) 25 MCG (1000 UNIT) tablet Take 2,000 Units by mouth every morning.     Melene Muller ON 02/26/2020] cyanocobalamin (,VITAMIN B-12,) 1000 MCG/ML injection Inject 1 mL (1,000  mcg total) into the muscle every 30 (thirty) days. 1 mL 5  . dorzolamide-timolol (COSOPT) 22.3-6.8 MG/ML ophthalmic solution Place 1 drop into the right eye 2 (two) times daily.    . finasteride (PROSCAR) 5 MG tablet Take 5 mg by mouth at bedtime.     . fish oil-omega-3 fatty acids 1000 MG capsule Take 3 g by mouth 2 (two) times daily.     Marland Kitchen gabapentin (NEURONTIN) 100 MG capsule Take 200 mg by mouth 2 (two) times daily.     . insulin glargine (LANTUS) 100 UNIT/ML injection Inject 30 Units  into the skin in the morning and at bedtime.    . insulin NPH (HUMULIN N,NOVOLIN N) 100 UNIT/ML injection Inject 6 Units into the skin daily as needed (for blood sugar levels over 250).     Marland Kitchen losartan (COZAAR) 25 MG tablet Take 1 tablet (25 mg total) by mouth daily. 90 tablet 2  . nitroGLYCERIN (NITROSTAT) 0.4 MG SL tablet Place 1 tablet (0.4 mg total) under the tongue every 5 (five) minutes as needed for chest pain. 25 tablet 3  . pantoprazole (PROTONIX) 40 MG tablet Take 1 tablet (40 mg total) by mouth daily. 30 tablet 11  . pravastatin (PRAVACHOL) 80 MG tablet Take 1 tablet (80 mg total) by mouth daily. (Patient taking differently: Take 80 mg by mouth at bedtime. ) 90 tablet 1  . prazosin (MINIPRESS) 2 MG capsule Take 2 mg by mouth at bedtime.    . torsemide (DEMADEX) 20 MG tablet Take 20 mg by mouth daily.    . traZODone (DESYREL) 50 MG tablet Take 50 mg by mouth at bedtime.       No current facility-administered medications for this visit.    Allergies as of 02/19/2020 - Review Complete 02/19/2020  Allergen Reaction Noted  . Lisinopril Cough 05/20/2019    Family History  Problem Relation Age of Onset  . Pneumonia Mother 50       complications of diabetes  . Pneumonia Father 90       complication of pneumonia  . Cancer Sister        breast  . Cancer Sister        breast cancer  . Colon cancer Neg Hx     Social History   Socioeconomic History  . Marital status: Married    Spouse name: valeria  . Number of children: 3  . Years of education: Not on file  . Highest education level: Not on file  Occupational History  . Occupation: disabled  Tobacco Use  . Smoking status: Current Every Day Smoker    Packs/day: 1.50    Years: 50.00    Pack years: 75.00    Types: Cigarettes    Start date: 09/05/1960  . Smokeless tobacco: Never Used  Substance and Sexual Activity  . Alcohol use: No    Comment: quit 1980's  . Drug use: No  . Sexual activity: Not on file  Other Topics  Concern  . Not on file  Social History Narrative  . Not on file   Social Determinants of Health   Financial Resource Strain:   . Difficulty of Paying Living Expenses:   Food Insecurity:   . Worried About Programme researcher, broadcasting/film/video in the Last Year:   . Barista in the Last Year:   Transportation Needs:   . Freight forwarder (Medical):   Marland Kitchen Lack of Transportation (Non-Medical):   Physical Activity:   . Days  of Exercise per Week:   . Minutes of Exercise per Session:   Stress:   . Feeling of Stress :   Social Connections:   . Frequency of Communication with Friends and Family:   . Frequency of Social Gatherings with Friends and Family:   . Attends Religious Services:   . Active Member of Clubs or Organizations:   . Attends Banker Meetings:   Marland Kitchen Marital Status:     Review of Systems: Gen: see HPI CV: Denies chest pain, palpitations, syncope, peripheral edema, and claudication. Resp: Denies dyspnea at rest, cough, wheezing, coughing up blood, and pleurisy. GI: see HPI Derm: Denies rash, itching, dry skin Psych: Denies depression, anxiety, memory loss, confusion. No homicidal or suicidal ideation.  Heme: Denies bruising, bleeding, and enlarged lymph nodes.  Physical Exam: BP (!) 142/64   Pulse 93   Temp (!) 97.1 F (36.2 C) (Oral)   Ht 6\' 2"  (1.88 m)   Wt (!) 272 lb (123.4 kg)   BMI 34.92 kg/m  General:   Alert and oriented. No distress noted. Pleasant and cooperative.  Head:  Normocephalic and atraumatic. Eyes:  Conjuctiva clear without scleral icterus. Mouth:  Mask in place Abdomen:  +BS, soft, non-tender and non-distended. No rebound or guarding. No HSM or masses noted. Msk:  Symmetrical without gross deformities. Normal posture. Extremities:  Without edema. Neurologic:  Alert and  oriented x4 Psych:  Alert and cooperative. Normal mood and affect.  ASSESSMENT: Kyle Daniel is a 74 y.o. male presenting today with history of transfusion dependent  anemia, s/p admission at outside facility in Oct 2020 and then again in July 2021 at [facility], undergoing EGD and colonoscopy without obvious source. Remains on 81 mg aspirin but no other NSAIDs and continues on PPI. Although stool has been dark intermittently, does not sound like true melena. Hgb improved after discharge and will recheck again today.   Will need capsule study to evaluate small bowel. Recheck CBC and iron studies today.   He notes intermittent chest burning with exertion, which was also noted during hospitalization. Upcoming appt with cardiology. Discussed signs/symptoms that would necessitate urgent eval.    PLAN:  CBC, iron studies today  Arranging capsule study  Keep follow-up appt with cardiology  Continue Protonix  Further recommendations to follow  [facility], PhD, ANP-BC [facility]

## 2020-02-20 LAB — CBC WITH DIFFERENTIAL/PLATELET
Absolute Monocytes: 264 cells/uL (ref 200–950)
Basophils Absolute: 28 cells/uL (ref 0–200)
Basophils Relative: 0.5 %
Eosinophils Absolute: 83 cells/uL (ref 15–500)
Eosinophils Relative: 1.5 %
HCT: 30.5 % — ABNORMAL LOW (ref 38.5–50.0)
Hemoglobin: 8.6 g/dL — ABNORMAL LOW (ref 13.2–17.1)
Lymphs Abs: 1260 cells/uL (ref 850–3900)
MCH: 19.9 pg — ABNORMAL LOW (ref 27.0–33.0)
MCHC: 28.2 g/dL — ABNORMAL LOW (ref 32.0–36.0)
MCV: 70.6 fL — ABNORMAL LOW (ref 80.0–100.0)
MPV: 9.8 fL (ref 7.5–12.5)
Monocytes Relative: 4.8 %
Neutro Abs: 3867 cells/uL (ref 1500–7800)
Neutrophils Relative %: 70.3 %
Platelets: 351 10*3/uL (ref 140–400)
RBC: 4.32 10*6/uL (ref 4.20–5.80)
RDW: 21.5 % — ABNORMAL HIGH (ref 11.0–15.0)
Total Lymphocyte: 22.9 %
WBC: 5.5 10*3/uL (ref 3.8–10.8)

## 2020-02-20 LAB — IRON,TIBC AND FERRITIN PANEL
Ferritin: 2 ng/mL — ABNORMAL LOW (ref 24–380)
Iron: 18 ug/dL — ABNORMAL LOW (ref 50–180)

## 2020-02-20 LAB — CBC MORPHOLOGY

## 2020-02-24 ENCOUNTER — Telehealth: Payer: Self-pay | Admitting: Internal Medicine

## 2020-02-24 ENCOUNTER — Other Ambulatory Visit: Payer: Self-pay

## 2020-02-24 DIAGNOSIS — D509 Iron deficiency anemia, unspecified: Secondary | ICD-10-CM

## 2020-02-24 NOTE — Telephone Encounter (Signed)
Returned pt's call.  See result note.  

## 2020-02-24 NOTE — Telephone Encounter (Signed)
[phone]  PATIENT RETURNED CALL, PLEASE CALL BACK

## 2020-02-25 ENCOUNTER — Telehealth: Payer: Self-pay | Admitting: Internal Medicine

## 2020-02-25 NOTE — Telephone Encounter (Signed)
[phone] or [phone] please call patient, he has a question about his procedure

## 2020-02-25 NOTE — Telephone Encounter (Signed)
Called pt and answered questions he had

## 2020-02-26 ENCOUNTER — Ambulatory Visit (HOSPITAL_COMMUNITY)
Admission: RE | Admit: 2020-02-26 | Discharge: 2020-02-26 | Disposition: A | Discharge status: Home / Self Care | Payer: Medicare Other | Attending: Internal Medicine | Admitting: Internal Medicine

## 2020-02-26 ENCOUNTER — Encounter (HOSPITAL_COMMUNITY)
Admission: RE | Disposition: A | Discharge status: Home / Self Care | Payer: Self-pay | Source: Home / Self Care | Attending: Internal Medicine

## 2020-02-26 DIAGNOSIS — D500 Iron deficiency anemia secondary to blood loss (chronic): Secondary | ICD-10-CM | POA: Diagnosis not present

## 2020-02-26 DIAGNOSIS — K2951 Unspecified chronic gastritis with bleeding: Secondary | ICD-10-CM | POA: Diagnosis not present

## 2020-02-26 DIAGNOSIS — K2100 Gastro-esophageal reflux disease with esophagitis, without bleeding: Secondary | ICD-10-CM | POA: Diagnosis not present

## 2020-02-26 DIAGNOSIS — D509 Iron deficiency anemia, unspecified: Secondary | ICD-10-CM | POA: Insufficient documentation

## 2020-02-26 DIAGNOSIS — R195 Other fecal abnormalities: Secondary | ICD-10-CM | POA: Insufficient documentation

## 2020-02-26 DIAGNOSIS — K921 Melena: Secondary | ICD-10-CM

## 2020-02-26 HISTORY — PX: GIVENS CAPSULE STUDY: SHX5432

## 2020-02-26 SURGERY — IMAGING PROCEDURE, GI TRACT, INTRALUMINAL, VIA CAPSULE

## 2020-02-26 NOTE — OR Nursing (Signed)
Patient here for a Givens capsule study. Was called by Pam at registration and said patient was having chest pain. Went to get patient and he said he got these sometimes when he walks so far. Stated he parked at the front of the hospital and had to walk a long way. By the time patient was to pre-op his pain had subsided. I offered to take patient to the ED and he denied and stated his pain had subsided. Patient stated he had bypass surgery in 2007 and has episodes like this with increased activity. Patient taken to his car via wheelchair after the procedure.

## 2020-02-28 ENCOUNTER — Encounter (HOSPITAL_COMMUNITY)
Admission: RE | Admit: 2020-02-28 | Discharge: 2020-02-28 | Disposition: A | Discharge status: Home / Self Care | Payer: Medicare Other | Source: Ambulatory Visit | Attending: Gastroenterology | Admitting: Gastroenterology

## 2020-02-28 ENCOUNTER — Encounter (HOSPITAL_COMMUNITY): Payer: Self-pay

## 2020-02-28 ENCOUNTER — Other Ambulatory Visit: Payer: Self-pay

## 2020-02-28 DIAGNOSIS — D509 Iron deficiency anemia, unspecified: Secondary | ICD-10-CM | POA: Insufficient documentation

## 2020-02-28 MED ORDER — SODIUM CHLORIDE 0.9 % IV SOLN
510.0000 mg | Freq: Once | INTRAVENOUS | Status: AC
  Administered 2020-02-28: 510 mg via INTRAVENOUS
  Filled 2020-02-28: qty 17

## 2020-02-28 MED ORDER — SODIUM CHLORIDE 0.9 % IV SOLN: Freq: Once | INTRAVENOUS | Status: AC

## 2020-03-02 ENCOUNTER — Encounter (HOSPITAL_COMMUNITY): Payer: Self-pay | Admitting: Internal Medicine

## 2020-03-04 ENCOUNTER — Other Ambulatory Visit: Payer: Self-pay

## 2020-03-04 DIAGNOSIS — D509 Iron deficiency anemia, unspecified: Secondary | ICD-10-CM

## 2020-03-05 ENCOUNTER — Telehealth: Payer: Self-pay | Admitting: Gastroenterology

## 2020-03-05 DIAGNOSIS — K921 Melena: Secondary | ICD-10-CM

## 2020-03-05 DIAGNOSIS — D509 Iron deficiency anemia, unspecified: Secondary | ICD-10-CM

## 2020-03-05 NOTE — Op Note (Addendum)
  Small Bowel Givens Capsule Study Procedure date: February 26, 2020  Referring Provider: Lewie Loron, NP PCP:  Dr. Pearson Grippe, MD  Indication for procedure: IDA, transfusion dependent anemia, heme positive stool, questionable melena.  EGD January 28, 2020 with mild erosive reflux esophagitis, slight chronic gastritis on biopsy with negative H. pylori, minimally eroded mucosa of the second portion of the duodenum.  Colonoscopy July 02/11/2020: Normal terminal ileum normal.  Colon appeared normal.    Patient data:  Wt: 123.4 kg Ht: 6'2" Waist: Not available  Findings: Patient swallowed capsule without any difficulty.  Capsule remained in the stomach the entire length of the study.  At 3 hours 4 minutes and 11 seconds there was a small amount of fresh blood noted.  Recent upper endoscopy gastric biopsy/gastritis as outlined above.  First Gastric image: 35 seconds, capsule never left the stomach during the 8-hour study.   Summary & Recommendations: Obtain abdominal film to make sure the capsule passed. We will regroup for repeat capsule endoscopy.  Plan for Reglan 10 mg IV 30 minutes before swallowing the capsule (to assist with faster passage of capsule from the stomach). Continue iron infusions as planned.  Pertinent images reviewed with Dr. Jena Gauss.   Leanna Battles. Dixon Boos [facility] [phone] 8/12/20214:15 PM    Attending note: Agree with above.  Pertinent images reviewed.

## 2020-03-05 NOTE — Telephone Encounter (Signed)
Please let patient know that his capsule study has been reviewed.  Unfortunately, the capsule never left his stomach during the study.  We were not able to see his small bowel because of this.  There was a small amount of fresh blood noted in the stomach, prior upper endoscopy with gastritis and gastric biopsy.  Reviewed images with Dr. Lovena Neighbours.  #1 he needs abdominal x-ray to make sure the capsule passed #2 Dr. Jena Gauss recommends repeat capsule endoscopy for profound IDA/transfusion dependent anemia/questionable melena. #3 give Reglan 10 mg IV 30 minutes before the patient swallows the capsule #4 iron infusions as planned by Lewie Loron, NP

## 2020-03-06 ENCOUNTER — Encounter (HOSPITAL_COMMUNITY)
Admission: RE | Admit: 2020-03-06 | Discharge: 2020-03-06 | Disposition: A | Discharge status: Home / Self Care | Payer: Medicare Other | Source: Ambulatory Visit | Attending: Nephrology | Admitting: Nephrology

## 2020-03-06 ENCOUNTER — Encounter (HOSPITAL_COMMUNITY): Payer: Self-pay

## 2020-03-06 ENCOUNTER — Ambulatory Visit (HOSPITAL_COMMUNITY)
Admission: RE | Admit: 2020-03-06 | Discharge: 2020-03-06 | Disposition: A | Discharge status: Home / Self Care | Payer: Medicare Other | Source: Ambulatory Visit | Attending: Gastroenterology | Admitting: Gastroenterology

## 2020-03-06 ENCOUNTER — Other Ambulatory Visit: Payer: Self-pay

## 2020-03-06 DIAGNOSIS — D509 Iron deficiency anemia, unspecified: Secondary | ICD-10-CM | POA: Diagnosis not present

## 2020-03-06 MED ORDER — SODIUM CHLORIDE 0.9 % IV SOLN
510.0000 mg | Freq: Once | INTRAVENOUS | Status: AC
  Administered 2020-03-06: 510 mg via INTRAVENOUS
  Filled 2020-03-06: qty 510

## 2020-03-06 MED ORDER — SODIUM CHLORIDE 0.9 % IV SOLN: Freq: Once | INTRAVENOUS | Status: AC

## 2020-03-06 NOTE — Telephone Encounter (Signed)
Spoke with Kyle Daniel. Pt is going to have Xray completed today after his iron infusion.

## 2020-03-06 NOTE — Telephone Encounter (Signed)
Spoke with pts spouse. She was notified of results. Pt is currently at the hospital getting ready to get his second iron infusion. Infusion is scheduled for 10 AM this morning 03/06/20.   [facility] please arrange xray and repeat capsule endoscopy.

## 2020-03-06 NOTE — Telephone Encounter (Signed)
Called spoke with spouse and aware patient can go for xray and does not need appt. Advised will call back regarding GIVENS capsule date. Called endo and LMOVM to call back with dates

## 2020-03-06 NOTE — Telephone Encounter (Addendum)
Spoke with spouse. He has been scheduled for GIVENS CAPSULE STUDY 8/19 at 7:30am. Patient needs to arrive at 6:15am as he needs reglan prior to swallowing givens. Discussed instructions with spouse on phone. Orders placed.   Per carolyn in endo place reglan instructions in special needs of order. Orders placed.   Checked [facility] website for PA and received message "Notification or Prior Authorization is not required for the requested services"  Decision ID #:[id number]

## 2020-03-06 NOTE — Addendum Note (Signed)
Addended by: Armstead Peaks on: 03/06/2020 09:45 AM   Modules accepted: Orders

## 2020-03-12 ENCOUNTER — Encounter (HOSPITAL_COMMUNITY)
Admission: RE | Disposition: A | Discharge status: Home / Self Care | Payer: Self-pay | Source: Home / Self Care | Attending: Internal Medicine

## 2020-03-12 ENCOUNTER — Ambulatory Visit (HOSPITAL_COMMUNITY)
Admission: RE | Admit: 2020-03-12 | Discharge: 2020-03-12 | Disposition: A | Discharge status: Home / Self Care | Payer: Medicare Other | Attending: Internal Medicine | Admitting: Internal Medicine

## 2020-03-12 DIAGNOSIS — R195 Other fecal abnormalities: Secondary | ICD-10-CM | POA: Insufficient documentation

## 2020-03-12 DIAGNOSIS — D509 Iron deficiency anemia, unspecified: Secondary | ICD-10-CM | POA: Diagnosis not present

## 2020-03-12 HISTORY — PX: GIVENS CAPSULE STUDY: SHX5432

## 2020-03-12 SURGERY — IMAGING PROCEDURE, GI TRACT, INTRALUMINAL, VIA CAPSULE

## 2020-03-12 MED ORDER — SODIUM CHLORIDE 0.9 % IV SOLN
INTRAVENOUS | Status: DC
Start: 2020-03-12 — End: 2020-03-12

## 2020-03-12 MED ORDER — METOCLOPRAMIDE HCL 5 MG/ML IJ SOLN
10.0000 mg | Freq: Once | INTRAMUSCULAR | Status: AC
  Administered 2020-03-12: 10 mg via INTRAVENOUS

## 2020-03-13 ENCOUNTER — Encounter (HOSPITAL_COMMUNITY): Payer: Self-pay | Admitting: Internal Medicine

## 2020-03-13 ENCOUNTER — Ambulatory Visit: Payer: Medicare Other | Admitting: Cardiovascular Disease

## 2020-03-13 NOTE — Op Note (Addendum)
Small Bowel Givens Capsule Study Procedure date:  03/12/20  Referring Provider:  Dr. Jena Gauss PCP:  Dr. Pearson Grippe, MD  Indication for procedure:  IDA, transfusion dependent anemia, heme positive stool, questionable melena.  EGD January 28, 2020 with mild erosive reflux esophagitis, slight chronic gastritis on biopsy with negative H. pylori, minimally eroded mucosa of the second portion of the duodenum.  Colonoscopy July 02/11/2020: Normal terminal ileum normal.  Colon appeared normal. Failed previous Givens capsule on 02/26/20  Patient data:  Wt: 123.4 kg Ht: 6'2" Waist: N/A  Findings:  Complete study to cecum. Some image areas poor quality due to debris/bubbles. Stomach with several images of blood flecks or wisps of blood without obvious source. Multiple images of duodenal/proximal jejunal areas with what appear lymphangiectasias without active bleeding. Further along in the study, images with possible erosions/ulcers. See multiple images with appropriate labels.  First Gastric image:  00:00:54 First Duodenal image: 02:01:57 First Ileo-Cecal Valve image: N/A First Cecal image: 07:52:09 Gastric Passage time: 02h 12m Small Bowel Passage time:  05h 13m  Summary & Recommendations: Several areas that could be source of intermittent bleeding noted, no active bleeding during the study. However, there was some flecks and wisps of blood noted, without obvious source. Due to possible ulcers, would re-verify no NSAIDs. May need to consider stopping daily 81mg  ASA, depending on clinical rational for/benifits of aspirin. Would likely need to get established with hematology for ongoing anemia montioring and management.   Thank you for allowing to participate in the care of [place], DNP, AGNP-C Adult & Gerontological Nurse Practitioner [facility]   Attending note: Pertinent images reviewed.

## 2020-03-18 ENCOUNTER — Other Ambulatory Visit: Payer: Self-pay

## 2020-03-18 DIAGNOSIS — D509 Iron deficiency anemia, unspecified: Secondary | ICD-10-CM

## 2020-03-23 ENCOUNTER — Other Ambulatory Visit: Payer: Self-pay

## 2020-03-23 ENCOUNTER — Ambulatory Visit: Payer: Medicare Other | Admitting: Cardiology

## 2020-03-23 ENCOUNTER — Encounter: Payer: Self-pay | Admitting: Cardiology

## 2020-03-23 VITALS — BP 134/72 | HR 77 | Ht 73.0 in | Wt 267.2 lb

## 2020-03-23 DIAGNOSIS — I25810 Atherosclerosis of coronary artery bypass graft(s) without angina pectoris: Secondary | ICD-10-CM | POA: Diagnosis not present

## 2020-03-23 DIAGNOSIS — I10 Essential (primary) hypertension: Secondary | ICD-10-CM

## 2020-03-23 DIAGNOSIS — E785 Hyperlipidemia, unspecified: Secondary | ICD-10-CM | POA: Diagnosis not present

## 2020-03-23 NOTE — Progress Notes (Signed)
Clinical Summary Kyle Daniel is a 74 y.o.male last seen by Dr Purvis Sheffield, this is our first visit together.  1. CAD - history of CABG - underwent three-vessel coronary artery bypass graft surgery on 12/18/2002 with a LIMA to the LAD, left radial to the ramus intermedius, and SVG to circumflex marginal Kyle Daniel.    - some recent chest pain - started about 6 month ago. Right sided upper chest. Mostly at rest, usually with laying. Worst with position. No other associated symptoms. Lasts about 30 minutes - no exertional symptoms. No SOB/DOE.  -compliant with meds.   2. HTN  compliant with meds     3. Hyperlipidemia - compliant with meds   4. Cartoid stenosis. - 05/2019 RICA <50%, LICA 50-69%    5. Anemia, heme + stool - July 2021 Hgb 5.6 on admission. colonoscopy and EGD during admission without obvious source for bleeding - followed by GI - awaiting capsule endoscopy   Past Medical History:  Diagnosis Date  . ASCVD (arteriosclerotic cardiovascular disease)    S/P CABG surgery  in 2004; cardiac cath in 2005 revealed patent grafts.  Marland Kitchen BPH (benign prostatic hypertrophy)   . Carotid artery occlusion   . Cerebrovascular disease    bilateral bruits  . DM (diabetes mellitus) (HCC)    AODM-- insulin requiring for the past 68yrs  . Hx of adenomatous colonic polyps   . Hyperlipidemia   . Hypertension   . Tobacco abuse    50 pack years; current 1 pack per day     Allergies  Allergen Reactions  . Lisinopril Cough     Current Outpatient Medications  Medication Sig Dispense Refill  . albuterol (VENTOLIN HFA) 108 (90 Base) MCG/ACT inhaler Inhale 1-2 puffs into the lungs every 6 (six) hours as needed for wheezing or shortness of breath.     Marland Kitchen amitriptyline (ELAVIL) 25 MG tablet Take 25 mg by mouth at bedtime.    Marland Kitchen amLODipine (NORVASC) 10 MG tablet Take 1 tablet (10 mg total) by mouth daily. 30 tablet 5  . aspirin 81 MG tablet Take 1 tablet (81 mg total) by mouth daily  with breakfast. 30 tablet 1  . cholecalciferol (VITAMIN D3) 25 MCG (1000 UNIT) tablet Take 2,000 Units by mouth every morning.     . cyanocobalamin (,VITAMIN B-12,) 1000 MCG/ML injection Inject 1 mL (1,000 mcg total) into the muscle every 30 (thirty) days. 1 mL 5  . dorzolamide-timolol (COSOPT) 22.3-6.8 MG/ML ophthalmic solution Place 1 drop into the right eye 2 (two) times daily.    . finasteride (PROSCAR) 5 MG tablet Take 5 mg by mouth at bedtime.     . fish oil-omega-3 fatty acids 1000 MG capsule Take 3 g by mouth 2 (two) times daily.     Marland Kitchen gabapentin (NEURONTIN) 100 MG capsule Take 200 mg by mouth 2 (two) times daily.     . insulin glargine (LANTUS) 100 UNIT/ML injection Inject 30 Units into the skin in the morning and at bedtime.    . insulin NPH (HUMULIN N,NOVOLIN N) 100 UNIT/ML injection Inject 6 Units into the skin daily as needed (for blood sugar levels over 250).     Marland Kitchen losartan (COZAAR) 25 MG tablet Take 1 tablet (25 mg total) by mouth daily. 90 tablet 2  . pravastatin (PRAVACHOL) 80 MG tablet Take 1 tablet (80 mg total) by mouth daily. 90 tablet 1  . prazosin (MINIPRESS) 2 MG capsule Take 2 mg by mouth at bedtime.    Marland Kitchen  torsemide (DEMADEX) 20 MG tablet Take 20 mg by mouth daily.    . traZODone (DESYREL) 50 MG tablet Take 50 mg by mouth at bedtime.      . nitroGLYCERIN (NITROSTAT) 0.4 MG SL tablet Place 1 tablet (0.4 mg total) under the tongue every 5 (five) minutes as needed for chest pain. 25 tablet 3   No current facility-administered medications for this visit.     Past Surgical History:  Procedure Laterality Date  . BIOPSY  01/28/2020   Procedure: BIOPSY;  Surgeon: Kyle Ade, MD;  [facility];  Service: Endoscopy;;  . CERVICAL DISCECTOMY  1996   anterior cervical spine discectomy and fusion  . CHOLECYSTECTOMY  2005  . COLONOSCOPY W/ POLYPECTOMY     4 adenomatous polyps  . COLONOSCOPY WITH PROPOFOL N/A 01/29/2020   normal colon, normal appearing TI.   Marland Kitchen  CORONARY ARTERY BYPASS GRAFT    . ESOPHAGOGASTRODUODENOSCOPY  07/04/2007   Dr. Darrick Penna; normal esophagus, erythema/edema in gastric antrum s/p biopsied, duodenitis.  Pathology positive for H. pylori.  . ESOPHAGOGASTRODUODENOSCOPY (EGD) WITH PROPOFOL N/A 01/28/2020    mild erosive reflux esophagitis, s/p biopsy of gastric mucosa, normal duodenum overall except for minimally eroded second portion of mucosa s/p biopsy. Pathology: slight chronic gastritis, negative H.pylori, benign duodenal mucosa, no sprue.   Marland Kitchen GIVENS CAPSULE STUDY N/A 02/26/2020   Procedure: GIVENS CAPSULE STUDY;  Surgeon: Kyle Bal, DO;  Location: [facility];  Service: Endoscopy;  Laterality: N/A;  7:30am  . GIVENS CAPSULE STUDY N/A 03/12/2020   Procedure: GIVENS CAPSULE STUDY;  Surgeon: Kyle Ade, MD;  Location: [facility];  Service: Endoscopy;  Laterality: N/A;  7:30am, pt to arrive early to receive meds 30 mins prior to capsule     Allergies  Allergen Reactions  . Lisinopril Cough      Family History  Problem Relation Age of Onset  . Pneumonia Mother 15       complications of diabetes  . Pneumonia Father 90       complication of pneumonia  . Cancer Sister        breast  . Cancer Sister        breast cancer  . Colon cancer Neg Hx      Social History Mr. Markwood reports that he has been smoking cigarettes. He started smoking about 59 years ago. He has a 75.00 pack-year smoking history. He has never used smokeless tobacco. Mr. Rhoads reports no history of alcohol use.   Review of Systems CONSTITUTIONAL: No weight loss, fever, chills, weakness or fatigue.  HEENT: Eyes: No visual loss, blurred vision, double vision or yellow sclerae.No hearing loss, sneezing, congestion, runny nose or sore throat.  SKIN: No rash or itching.  CARDIOVASCULAR: per hpi RESPIRATORY: No shortness of breath, cough or sputum.  GASTROINTESTINAL: No anorexia, nausea, vomiting or diarrhea. No abdominal pain or blood.    GENITOURINARY: No burning on urination, no polyuria NEUROLOGICAL: No headache, dizziness, syncope, paralysis, ataxia, numbness or tingling in the extremities. No change in bowel or bladder control.  MUSCULOSKELETAL: No muscle, back pain, joint pain or stiffness.  LYMPHATICS: No enlarged nodes. No history of splenectomy.  PSYCHIATRIC: No history of depression or anxiety.  ENDOCRINOLOGIC: No reports of sweating, cold or heat intolerance. No polyuria or polydipsia.  Marland Kitchen   Physical Examination Vitals:   03/23/20 1300  BP: 134/72  Pulse: 77  SpO2: 95%   Filed Weights   03/23/20 1300  Weight: 267  lb 3.2 oz (121.2 kg)    Gen: resting comfortably, no acute distress HEENT: no scleral icterus, pupils equal round and reactive, no palptable cervical adenopathy,  CV: RRR, no m/r/g, no jvd Resp: Clear to auscultation bilaterally GI: abdomen is soft, non-tender, non-distended, normal bowel sounds, no hepatosplenomegaly MSK: extremities are warm, no edema.  Skin: warm, no rash Neuro:  no focal deficits Psych: appropriate affect   Diagnostic Studies  Echocardiogram 05/31/2019:  1. Left ventricular ejection fraction, by visual estimation, is 60 to  65%. The left ventricle has normal function. There is moderately increased  left ventricular hypertrophy.  2. Left ventricular diastolic parameters are consistent with Grade II  diastolic dysfunction (pseudonormalization).  3. Global right ventricle has normal systolic function.The right  ventricular size is normal. No increase in right ventricular wall  thickness.  4. Left atrial size was mildly dilated.  5. Right atrial size was normal.  6. Presence of pericardial fat pad.  7. Mild aortic valve annular calcification.  8. Mild to moderate mitral annular calcification.  9. The mitral valve is grossly normal. Trace mitral valve regurgitation.  10. The tricuspid valve is grossly normal. Tricuspid valve regurgitation  is trivial.   11. The aortic valve is tricuspid. Aortic valve regurgitation is not  visualized.  12. The pulmonic valve was grossly normal. Pulmonic valve regurgitation is  trivial.  13. TR signal is inadequate for assessing pulmonary artery systolic  pressure.  14. The inferior vena cava is normal in size with greater than 50%  respiratory variability, suggesting right atrial pressure of 3 mmHg.    Assessment and Plan  1. CAD - atypical chest pains not consistent with cardiac ischemia - continue current meds   2. HTN - at goal, continue current meds   3. Hyperlipidemia - request pcp labs   4. History of GI bleed/Anemia - per GI, ongoign evaluation for anemia   5. Carotid stenosis - repeat US after next visit   Antoine Poche, M.D.

## 2020-03-23 NOTE — Patient Instructions (Signed)
Medication Instructions:  Your physician recommends that you continue on your current medications as directed. Please refer to the Current Medication list given to you today.  *If you need a refill on your cardiac medications before your next appointment, please call your pharmacy*   Lab Work: None today If you have labs (blood work) drawn today and your tests are completely normal, you will receive your results only by: . MyChart Message (if you have MyChart) OR . A paper copy in the mail If you have any lab test that is abnormal or we need to change your treatment, we will call you to review the results.   Testing/Procedures: None today   Follow-Up: At [facility], you and your health needs are our priority.  As part of our continuing mission to provide you with exceptional heart care, we have created designated Provider Care Teams.  These Care Teams include your primary Cardiologist (physician) and Advanced Practice Providers (APPs -  Physician Assistants and Nurse Practitioners) who all work together to provide you with the care you need, when you need it.  We recommend signing up for the patient portal called "MyChart".  Sign up information is provided on this After Visit Summary.  MyChart is used to connect with patients for Virtual Visits (Telemedicine).  Patients are able to view lab/test results, encounter notes, upcoming appointments, etc.  Non-urgent messages can be sent to your provider as well.   To learn more about what you can do with MyChart, go to [email or web address].    Your next appointment:   6 month(s)  The format for your next appointment:   In Person  Provider:   Jonathan Branch, MD   Other Instructions None       Thank you for choosing [facility] !         

## 2020-04-02 ENCOUNTER — Telehealth: Payer: Self-pay | Admitting: Nurse Practitioner

## 2020-04-02 DIAGNOSIS — D509 Iron deficiency anemia, unspecified: Secondary | ICD-10-CM

## 2020-04-02 NOTE — Addendum Note (Signed)
Addended by: Armstead Peaks on: 04/02/2020 04:01 PM   Modules accepted: Orders

## 2020-04-02 NOTE — Telephone Encounter (Signed)
Please tell the patient his Given's Capsule study was reviewed by myself and Dr. Jena Gauss. Findings are below. I recommend referral to hematology for ongoing management of anemia. Please confirm he is not on any NSAIDs. I will send a note to Dr. Wyline Mood to evaluate risks/benefits of his daily aspirin to see if he could come off of it.   Several areas that could be source of intermittent bleeding noted, but no active bleeding during the study. However, there was some flecks and wisps of blood noted, without obvious source. Due to possible ulcers, would re-verify no NSAIDs. May need to consider stopping daily 81mg  ASA, depending on clinical rational for/benifits of aspirin.   Please let me know if any questions or concerns.

## 2020-04-02 NOTE — Telephone Encounter (Signed)
Referral sent 

## 2020-04-02 NOTE — Telephone Encounter (Signed)
Spoke with pt. Pt was notified of results. Pt isn't taking any NSAIDS. Pt is aware that the provider will reach out to Dr. Wyline Mood to discuss the importance of his daily Asprin use.   [facility] please send hematology referral.

## 2020-04-14 ENCOUNTER — Other Ambulatory Visit: Payer: Self-pay

## 2020-04-14 ENCOUNTER — Inpatient Hospital Stay (HOSPITAL_COMMUNITY): Payer: Medicare Other

## 2020-04-14 ENCOUNTER — Encounter (HOSPITAL_COMMUNITY): Payer: Self-pay | Admitting: Hematology

## 2020-04-14 ENCOUNTER — Inpatient Hospital Stay (HOSPITAL_COMMUNITY): Payer: Medicare Other | Attending: Hematology | Admitting: Hematology

## 2020-04-14 VITALS — BP 143/67 | HR 86 | Temp 97.1°F | Resp 18 | Ht 70.0 in | Wt 269.7 lb

## 2020-04-14 DIAGNOSIS — D509 Iron deficiency anemia, unspecified: Secondary | ICD-10-CM | POA: Diagnosis present

## 2020-04-14 DIAGNOSIS — Z794 Long term (current) use of insulin: Secondary | ICD-10-CM

## 2020-04-14 DIAGNOSIS — E119 Type 2 diabetes mellitus without complications: Secondary | ICD-10-CM

## 2020-04-14 DIAGNOSIS — F17210 Nicotine dependence, cigarettes, uncomplicated: Secondary | ICD-10-CM

## 2020-04-14 DIAGNOSIS — Z803 Family history of malignant neoplasm of breast: Secondary | ICD-10-CM | POA: Diagnosis not present

## 2020-04-14 DIAGNOSIS — I10 Essential (primary) hypertension: Secondary | ICD-10-CM

## 2020-04-14 DIAGNOSIS — Z122 Encounter for screening for malignant neoplasm of respiratory organs: Secondary | ICD-10-CM

## 2020-04-14 DIAGNOSIS — Z87891 Personal history of nicotine dependence: Secondary | ICD-10-CM

## 2020-04-14 LAB — IRON AND TIBC
Iron: 58 ug/dL (ref 45–182)
Saturation Ratios: 14 % — ABNORMAL LOW (ref 17.9–39.5)
TIBC: 421 ug/dL (ref 250–450)
UIBC: 363 ug/dL

## 2020-04-14 LAB — CBC WITH DIFFERENTIAL/PLATELET
Abs Immature Granulocytes: 0.02 10*3/uL (ref 0.00–0.07)
Basophils Absolute: 0 10*3/uL (ref 0.0–0.1)
Basophils Relative: 1 %
Eosinophils Absolute: 0.1 10*3/uL (ref 0.0–0.5)
Eosinophils Relative: 2 %
HCT: 35.9 % — ABNORMAL LOW (ref 39.0–52.0)
Hemoglobin: 10.9 g/dL — ABNORMAL LOW (ref 13.0–17.0)
Immature Granulocytes: 1 %
Lymphocytes Relative: 30 %
Lymphs Abs: 1.3 10*3/uL (ref 0.7–4.0)
MCH: 25.1 pg — ABNORMAL LOW (ref 26.0–34.0)
MCHC: 30.4 g/dL (ref 30.0–36.0)
MCV: 82.5 fL (ref 80.0–100.0)
Monocytes Absolute: 0.2 10*3/uL (ref 0.1–1.0)
Monocytes Relative: 5 %
Neutro Abs: 2.6 10*3/uL (ref 1.7–7.7)
Neutrophils Relative %: 61 %
Platelets: 232 10*3/uL (ref 150–400)
RBC: 4.35 MIL/uL (ref 4.22–5.81)
RDW: 25.6 % — ABNORMAL HIGH (ref 11.5–15.5)
WBC: 4.2 10*3/uL (ref 4.0–10.5)
nRBC: 0 % (ref 0.0–0.2)

## 2020-04-14 LAB — FOLATE: Folate: 10.8 ng/mL (ref >5.9)

## 2020-04-14 LAB — RETICULOCYTES
Immature Retic Fract: 17.7 % — ABNORMAL HIGH (ref 2.3–15.9)
RBC.: 4.31 MIL/uL (ref 4.22–5.81)
Retic Count, Absolute: 38.8 10*3/uL (ref 19.0–186.0)
Retic Ct Pct: 0.9 % (ref 0.4–3.1)

## 2020-04-14 LAB — LACTATE DEHYDROGENASE: LDH: 179 U/L (ref 98–192)

## 2020-04-14 LAB — FERRITIN: Ferritin: 8 ng/mL — ABNORMAL LOW (ref 24–336)

## 2020-04-14 LAB — VITAMIN B12: Vitamin B-12: 306 pg/mL (ref 180–914)

## 2020-04-14 NOTE — Patient Instructions (Addendum)
[facility] at [facility] Discharge Instructions  You were seen today by Dr. Ellin Saba.  He is a specialist of the blood, Hematologist.  He talked with you about your medical history, your family history and the events that led to today.  You will have lab work today. This will help [place] evaluate the reason behind your blood levels being so low.  If we notice that the labs are low, we will have you come back this week for iron infusions.  We will see you back in 6 weeks.     He talked with you about your smoking history.  Due to the years of smoking, you qualify for a low dose CT scan.  We will get you set up for this as well.     Thank you for choosing [facility] at [facility] to provide your oncology and hematology care.  To afford each patient quality time with our provider, please arrive at least 15 minutes before your scheduled appointment time.   If you have a lab appointment with the Cancer Center please come in thru the Main Entrance and check in at the main information desk.  You need to re-schedule your appointment should you arrive 10 or more minutes late.  We strive to give you quality time with our providers, and arriving late affects you and other patients whose appointments are after yours.  Also, if you no show three or more times for appointments you may be dismissed from the clinic at the providers discretion.     Again, thank you for choosing [facility].  Our hope is that these requests will decrease the amount of time that you wait before being seen by our physicians.       _____________________________________________________________  Should you have questions after your visit to [facility], please contact our office at [phone] and follow the prompts.  Our office hours are 8:00 a.m. and 4:30 p.m. Monday - Friday.  Please note that voicemails left after 4:00 p.m. may not be returned until the  following business day.  We are closed weekends and major holidays.  You do have access to a nurse 24-7, just call the main number to the clinic [phone] and do not press any options, hold on the line and a nurse will answer the phone.    For prescription refill requests, have your pharmacy contact our office and allow 72 hours.    Due to Covid, you will need to wear a mask upon entering the hospital. If you do not have a mask, a mask will be given to you at the Main Entrance upon arrival. For doctor visits, patients may have 1 support person age 60 or older with them. For treatment visits, patients can not have anyone with them due to social distancing guidelines and our immunocompromised population.

## 2020-04-14 NOTE — Progress Notes (Signed)
[facility] [address] [place] [address]   CLINIC:  Medical Oncology/Hematology  Patient Care Team: Jani Gravel, MD as PCP - General (Internal Medicine) Herminio Commons, MD (Inactive) as PCP - Cardiology (Cardiology) Center, [place] as Referring Physician (General Practice)  CHIEF COMPLAINTS/PURPOSE OF CONSULTATION:  Evaluation of iron deficiency anemia  HISTORY OF PRESENTING ILLNESS:  Kyle Daniel 74 y.o. male is here because of evaluation of iron deficiency anemia, at the request of Walden Field, NP from [facility]. He had a Givens Capsule study started on 8/4 with Dr. Gala Romney for IDA, transfusion dependent anemia, heme positive stool, and questionable melena. He received 3 units of blood on 01/27/2020 and Feraheme x 2 on 8/6 and 03/06/2020; he also received 5 units PRBCs in October 2020.  Today he is accompanied by his wife. He denies ever seeing frank blood in his stool, though he reports having melena intermittently. He tolerated the iron infusions and blood transfusions well and reports that they helped him feel better. He has never taken iron tablets before. He denies pica. He reports having CP intermittently even at rest. His energy levels are not that good. He denies having any MI's. He denies any unexplained weight loss. He used to take ranitidine for reflux, but does not take anything now.  He used to work for a [employer]. He continues smoking 1.5 PPD for 56 years. His 2 sisters had breast cancer. He denies having any family history of anemia.   MEDICAL HISTORY:  Past Medical History:  Diagnosis Date  . ASCVD (arteriosclerotic cardiovascular disease)    S/P CABG surgery  in 2004; cardiac cath in 2005 revealed patent grafts.  Marland Kitchen BPH (benign prostatic hypertrophy)   . Carotid artery occlusion   . Cerebrovascular disease    bilateral bruits  . DM (diabetes mellitus) ([place])    AODM-- insulin requiring for the past  67yr  . Hx of adenomatous colonic polyps   . Hyperlipidemia   . Hypertension   . Tobacco abuse    50 pack years; current 1 pack per day    SURGICAL HISTORY: Past Surgical History:  Procedure Laterality Date  . BIOPSY  01/28/2020   Procedure: BIOPSY;  Surgeon: RDaneil Dolin MD;  [facility];  Service: Endoscopy;;  . CERVICAL DISCECTOMY  1996   anterior cervical spine discectomy and fusion  . CHOLECYSTECTOMY  2005  . COLONOSCOPY W/ POLYPECTOMY     4 adenomatous polyps  . COLONOSCOPY WITH PROPOFOL N/A 01/29/2020   normal colon, normal appearing TI.   .Marland KitchenCORONARY ARTERY BYPASS GRAFT    . ESOPHAGOGASTRODUODENOSCOPY  07/04/2007   Dr. FOneida Alar normal esophagus, erythema/edema in gastric antrum s/p biopsied, duodenitis.  Pathology positive for H. pylori.  . ESOPHAGOGASTRODUODENOSCOPY (EGD) WITH PROPOFOL N/A 01/28/2020    mild erosive reflux esophagitis, s/p biopsy of gastric mucosa, normal duodenum overall except for minimally eroded second portion of mucosa s/p biopsy. Pathology: slight chronic gastritis, negative H.pylori, benign duodenal mucosa, no sprue.   .Marland KitchenGIVENS CAPSULE STUDY N/A 02/26/2020   Procedure: GIVENS CAPSULE STUDY;  Surgeon: CEloise Harman DO;  [facility];  Service: Endoscopy;  Laterality: N/A;  7:30am  . GIVENS CAPSULE STUDY N/A 03/12/2020   Procedure: GIVENS CAPSULE STUDY;  Surgeon: RDaneil Dolin MD;  [facility];  Service: Endoscopy;  Laterality: N/A;  7:30am, pt to arrive early to receive meds 30 mins prior to capsule  SOCIAL HISTORY: Social History   Socioeconomic History  . Marital status: Married    Spouse name: valeria  . Number of children: 3  . Years of education: Not on file  . Highest education level: Not on file  Occupational History  . Occupation: disabled  Tobacco Use  . Smoking status: Current Every Day Smoker    Packs/day: 1.50    Years: 50.00    Pack years: 75.00    Types: Cigarettes    Start date:  09/05/1960  . Smokeless tobacco: Never Used  Vaping Use  . Vaping Use: Never used  Substance and Sexual Activity  . Alcohol use: No    Comment: quit 1980's  . Drug use: No  . Sexual activity: Not on file  Other Topics Concern  . Not on file  Social History Narrative  . Not on file   Social Determinants of Health   Financial Resource Strain:   . Difficulty of Paying Living Expenses: Not on file  Food Insecurity:   . Worried About Charity fundraiser in the Last Year: Not on file  . Ran Out of Food in the Last Year: Not on file  Transportation Needs:   . Lack of Transportation (Medical): Not on file  . Lack of Transportation (Non-Medical): Not on file  Physical Activity:   . Days of Exercise per Week: Not on file  . Minutes of Exercise per Session: Not on file  Stress:   . Feeling of Stress : Not on file  Social Connections:   . Frequency of Communication with Friends and Family: Not on file  . Frequency of Social Gatherings with Friends and Family: Not on file  . Attends Religious Services: Not on file  . Active Member of Clubs or Organizations: Not on file  . Attends Archivist Meetings: Not on file  . Marital Status: Not on file  Intimate Partner Violence:   . Fear of Current or Ex-Partner: Not on file  . Emotionally Abused: Not on file  . Physically Abused: Not on file  . Sexually Abused: Not on file    FAMILY HISTORY: Family History  Problem Relation Age of Onset  . Pneumonia Mother 46       complications of diabetes  . Diabetes Mother   . Pneumonia Father 1       complication of pneumonia  . Cancer Sister        breast  . Cancer Sister        breast cancer  . Colon cancer Neg Hx     ALLERGIES:  is allergic to lisinopril.  MEDICATIONS:  Current Outpatient Medications  Medication Sig Dispense Refill  . albuterol (VENTOLIN HFA) 108 (90 Base) MCG/ACT inhaler Inhale 1-2 puffs into the lungs every 6 (six) hours as needed for wheezing or shortness  of breath.     Marland Kitchen amitriptyline (ELAVIL) 25 MG tablet Take 25 mg by mouth at bedtime.    Marland Kitchen amLODipine (NORVASC) 10 MG tablet Take 1 tablet (10 mg total) by mouth daily. 30 tablet 5  . aspirin 81 MG tablet Take 1 tablet (81 mg total) by mouth daily with breakfast. 30 tablet 1  . cholecalciferol (VITAMIN D3) 25 MCG (1000 UNIT) tablet Take 2,000 Units by mouth every morning.     . cyanocobalamin (,VITAMIN B-12,) 1000 MCG/ML injection Inject 1 mL (1,000 mcg total) into the muscle every 30 (thirty) days. 1 mL 5  . dorzolamide-timolol (COSOPT) 22.3-6.8 MG/ML ophthalmic solution Place 1  drop into the right eye 2 (two) times daily.    . finasteride (PROSCAR) 5 MG tablet Take 5 mg by mouth at bedtime.     . fish oil-omega-3 fatty acids 1000 MG capsule Take 3 g by mouth 2 (two) times daily.     Marland Kitchen gabapentin (NEURONTIN) 100 MG capsule Take 200 mg by mouth 2 (two) times daily.     . insulin glargine (LANTUS) 100 UNIT/ML injection Inject 30 Units into the skin in the morning and at bedtime.    . insulin NPH (HUMULIN N,NOVOLIN N) 100 UNIT/ML injection Inject 6 Units into the skin daily as needed (for blood sugar levels over 250).     Marland Kitchen losartan (COZAAR) 25 MG tablet Take 1 tablet (25 mg total) by mouth daily. 90 tablet 2  . pravastatin (PRAVACHOL) 80 MG tablet Take 1 tablet (80 mg total) by mouth daily. 90 tablet 1  . prazosin (MINIPRESS) 2 MG capsule Take 2 mg by mouth at bedtime.    . torsemide (DEMADEX) 20 MG tablet Take 20 mg by mouth daily.    . traZODone (DESYREL) 50 MG tablet Take 50 mg by mouth at bedtime.      . nitroGLYCERIN (NITROSTAT) 0.4 MG SL tablet Place 1 tablet (0.4 mg total) under the tongue every 5 (five) minutes as needed for chest pain. 25 tablet 3   No current facility-administered medications for this visit.    REVIEW OF SYSTEMS:   Review of Systems  Constitutional: Positive for fatigue (mild). Negative for appetite change and unexpected weight change.  Respiratory: Positive for  cough.   Cardiovascular: Positive for chest pain (intermittent, at rest).  Gastrointestinal: Negative for blood in stool.  Musculoskeletal: Positive for back pain (7/10 back pain).  Neurological: Positive for headaches (occasional).  All other systems reviewed and are negative.    PHYSICAL EXAMINATION: ECOG PERFORMANCE STATUS: 1 - Symptomatic but completely ambulatory  Vitals:   04/14/20 0808  BP: (!) 143/67  Pulse: 86  Resp: 18  Temp: (!) 97.1 F (36.2 C)  SpO2: 98%   Filed Weights   04/14/20 0808  Weight: 269 lb 11.2 oz (122.3 kg)   Physical Exam Vitals reviewed.  Constitutional:      Appearance: Normal appearance. He is obese.  Cardiovascular:     Rate and Rhythm: Normal rate and regular rhythm.     Pulses: Normal pulses.     Heart sounds: Normal heart sounds.  Pulmonary:     Effort: Pulmonary effort is normal.     Breath sounds: Normal breath sounds.  Abdominal:     Palpations: Abdomen is soft. There is no hepatomegaly, splenomegaly or mass.     Tenderness: There is no abdominal tenderness.     Hernia: No hernia is present.  Musculoskeletal:     Right lower leg: No edema.     Left lower leg: No edema.  Neurological:     General: No focal deficit present.     Mental Status: He is alert and oriented to person, place, and time.  Psychiatric:        Mood and Affect: Mood normal.        Behavior: Behavior normal.      LABORATORY DATA:  I have reviewed the data as listed Recent Results (from the past 2160 hour(s))  Vitamin B12     Status: Abnormal   Collection Time: 01/27/20  1:09 PM  Result Value Ref Range   Vitamin B-12 174 (L) 180 - 914 pg/mL  Comment: (NOTE) This assay is not validated for testing neonatal or myeloproliferative syndrome specimens for Vitamin B12 levels. Performed at [facility], [address] [place], [place] [address]   Folate, serum, performed at [facility] lab     Status: None   Collection Time: 01/27/20  1:09 PM  Result  Value Ref Range   Folate 15.3 >5.9 ng/mL    Comment: Performed at [facility], [address] [place], [place] [address]  Ferritin     Status: Abnormal   Collection Time: 01/27/20  1:09 PM  Result Value Ref Range   Ferritin 2 (L) 24 - 336 ng/mL    Comment: Performed at [facility], [address] [place], [place] [address]  Iron and TIBC     Status: Abnormal   Collection Time: 01/27/20  1:09 PM  Result Value Ref Range   Iron 20 (L) 45 - 182 ug/dL   TIBC 542 (H) 250 - 450 ug/dL   Saturation Ratios 4 (L) 17.9 - 39.5 %   UIBC 522 ug/dL    Comment: Performed at [facility], [address], [place], [place] [address]  POC occult blood, ED Provider will collect     Status: Abnormal   Collection Time: 01/27/20  1:48 PM  Result Value Ref Range   Fecal Occult Bld POSITIVE (A) NEGATIVE  Surgical pathology     Status: None   Collection Time: 01/28/20  2:43 PM  Result Value Ref Range   SURGICAL PATHOLOGY      SURGICAL PATHOLOGY CASE: [id number] PATIENT: Leonette Most Surgical Pathology Report     Clinical History: anemia, heme positive stool, melena     FINAL MICROSCOPIC DIAGNOSIS:  A. STOMACH, BIOPSY: - Slight chronic gastritis. - Warthin-Starry negative for Helicobacter pylori. - No intestinal metaplasia, dysplasia or carcinoma.  B. DUODENUM, BIOPSY: - Benign duodenal mucosa. - No features of celiac sprue or granulomas.    GROSS DESCRIPTION:  A: Received in formalin are tan, soft tissue fragments that are submitted in toto. Number: 2 size: 0.3 and 0.4 cm blocks: 1  B: Received in formalin are tan, soft tissue fragments that are submitted in toto. Number: 2 size: 0.3 and 0.4 cm blocks: 1   Final Diagnosis performed by Claudette Laws, MD.   Electronically signed 01/30/2020 Technical component performed at [facility], [place] [address] [place], [place] [address].  Professional component performed at [facility], [place] [address] [place], [place] [address].  Immunohistochemistry Technical component (if applicable) was performed at [facility]. [address], [place], [place], [place] [address].   IMMUNOHISTOCHEMISTRY DISCLAIMER (if applicable): Some of these immunohistochemical stains may have been developed and the performance characteristics determine by [facility]. Some may not have been cleared or approved by the U.S. Food and Drug Administration. The FDA has determined that such clearance or approval is not necessary. This test is used for clinical purposes. It should not be regarded as investigational or for research. This laboratory is certified under the [place] (CLIA-88) as qualified to perform high complexity clinical laboratory testing.  The controls stained appropriately.   CBC with Differential     Status: Abnormal   Collection Time: 02/03/20 10:19 AM  Result Value Ref Range   WBC 5.1 3.8 - 10.8 Thousand/uL   RBC 4.16 (L) 4.20 - 5.80 Million/uL   Hemoglobin 8.9 (L) 13.2 - 17.1 g/dL   HCT 30.4 (L) 38 - 50 %  MCV 73.1 (L) 80.0 - 100.0 fL   MCH 21.4 (L) 27.0 - 33.0 pg   MCHC 29.3 (L) 32.0 - 36.0 g/dL   RDW 21.8 (H) 11.0 - 15.0 %   Platelets 324 140 - 400 Thousand/uL   MPV 11.0 7.5 - 12.5 fL   Neutro Abs 2,933 1,500 - 7,800 cells/uL   Lymphs Abs 1,688 850 - 3,900 cells/uL   Absolute Monocytes 255 200 - 950 cells/uL   Eosinophils Absolute 194 15 - 500 cells/uL   Basophils Absolute 31 0 - 200 cells/uL   Neutrophils Relative % 57.5 %   Total Lymphocyte 33.1 %   Monocytes Relative 5.0 %   Eosinophils Relative 3.8 %   Basophils Relative 0.6 %   Smear Review      Comment: Review of peripheral smear confirms automated results.   CBC w/Diff/Platelet     Status: Abnormal   Collection Time: 02/19/20  9:01 AM  Result Value Ref Range   WBC 5.5 3.8 - 10.8 Thousand/uL   RBC 4.32 4.20 - 5.80 Million/uL   Hemoglobin 8.6 (L)  13.2 - 17.1 g/dL   HCT 30.5 (L) 38 - 50 %   MCV 70.6 (L) 80.0 - 100.0 fL   MCH 19.9 (L) 27.0 - 33.0 pg   MCHC 28.2 (L) 32.0 - 36.0 g/dL   RDW 21.5 (H) 11.0 - 15.0 %   Platelets 351 140 - 400 Thousand/uL   MPV 9.8 7.5 - 12.5 fL   Neutro Abs 3,867 1,500 - 7,800 cells/uL   Lymphs Abs 1,260 850 - 3,900 cells/uL   Absolute Monocytes 264 200 - 950 cells/uL   Eosinophils Absolute 83 15 - 500 cells/uL   Basophils Absolute 28 0 - 200 cells/uL   Neutrophils Relative % 70.3 %   Total Lymphocyte 22.9 %   Monocytes Relative 4.8 %   Eosinophils Relative 1.5 %   Basophils Relative 0.5 %   Smear Review      Comment: Review of peripheral smear confirms automated results.   Iron, TIBC and Ferritin Panel     Status: Abnormal   Collection Time: 02/19/20  9:01 AM  Result Value Ref Range   Iron 18 (L) 50 - 180 mcg/dL   TIBC CANCELED     Comment:   Test Not Performed due to reagent *  * or kit unavailability.            *  esult canceled by the ancillary.    Ferritin 2 (L) 24 - 380 ng/mL  CBC MORPHOLOGY     Status: None   Collection Time: 02/19/20  9:01 AM  Result Value Ref Range   CBC MORPHOLOGY  NORMAL    Comment: Tear-drop cells 1 + Elliptocytes 2 + Schistocytes 1 + Poikilocytosis 1 + Hypochromasia 1 +     RADIOGRAPHIC STUDIES: I have personally reviewed the radiological images as listed and agreed with the findings in the report.  ASSESSMENT:  1.  Microcytic anemia: -Admitted to the hospital from 01/27/2020 through 01/29/2020 with dizziness and dyspnea on exertion. -Found to have hemoglobin 5.6, received blood transfusions. -History of GI bleed in October 2020 at [facility] in [place], required 5 units of PRBC. -EGD on 01/28/2020 and colonoscopy on 01/29/2019 were nonrevealing. -Capsule study on 03/12/2020, stomach with several images of blood flecks or wisps of blood without obvious source.  Multiple images of duodenal, proximal jejunal areas with what appears lymphangiectasia's without  active bleeding.  Further along in the study, images with  possible erosions/ulcers. -Received Feraheme on 02/28/2020 and 03/06/2020.  2.  Social/family history: -1 and half pack per day current active smoker for 32 years. -2 sisters had breast cancer.   PLAN:  1.  Microcytic anemia: -We will check for coexisting nutritional deficiencies including Y81, folic acid, methylmalonic acid and copper levels. -We will also check for hemolysis with LDH, reticulocyte count and haptoglobin.  We will check serum protein electrophoresis. -We will schedule him for 2 infusions of Feraheme.  We will see him back in 6 weeks for follow-up.  2.  Smoking history: -I have discussed low-dose CT scan for lung cancer screening.  I have discussed the pros and cons.  He is agreeable to proceed with the scan.   All questions were answered. The patient knows to call the clinic with any problems, questions or concerns.   Derek Jack, MD 04/14/20 8:19 AM  [place] [phone]   I, Milinda Antis, am acting as a scribe for Dr. Sanda Linger.  I, Derek Jack MD, have reviewed the above documentation for accuracy and completeness, and I agree with the above.

## 2020-04-15 ENCOUNTER — Ambulatory Visit (HOSPITAL_COMMUNITY): Payer: Medicare Other | Admitting: Hematology

## 2020-04-15 LAB — PROTEIN ELECTROPHORESIS, SERUM
A/G Ratio: 1.2 (ref 0.7–1.7)
Albumin ELP: 3.5 g/dL (ref 2.9–4.4)
Alpha-1-Globulin: 0.2 g/dL (ref 0.0–0.4)
Alpha-2-Globulin: 0.6 g/dL (ref 0.4–1.0)
Beta Globulin: 1 g/dL (ref 0.7–1.3)
Gamma Globulin: 1 g/dL (ref 0.4–1.8)
Globulin, Total: 2.9 g/dL (ref 2.2–3.9)
Total Protein ELP: 6.4 g/dL (ref 6.0–8.5)

## 2020-04-15 LAB — HAPTOGLOBIN: Haptoglobin: 156 mg/dL (ref 34–355)

## 2020-04-17 ENCOUNTER — Other Ambulatory Visit: Payer: Self-pay

## 2020-04-17 ENCOUNTER — Inpatient Hospital Stay (HOSPITAL_COMMUNITY): Payer: Medicare Other

## 2020-04-17 VITALS — BP 128/60 | HR 67 | Temp 96.9°F | Resp 18

## 2020-04-17 DIAGNOSIS — D500 Iron deficiency anemia secondary to blood loss (chronic): Secondary | ICD-10-CM

## 2020-04-17 DIAGNOSIS — K922 Gastrointestinal hemorrhage, unspecified: Secondary | ICD-10-CM

## 2020-04-17 DIAGNOSIS — D509 Iron deficiency anemia, unspecified: Secondary | ICD-10-CM | POA: Diagnosis not present

## 2020-04-17 MED ORDER — SODIUM CHLORIDE 0.9 % IV SOLN: Freq: Once | INTRAVENOUS | Status: AC

## 2020-04-17 MED ORDER — SODIUM CHLORIDE 0.9 % IV SOLN
510.0000 mg | Freq: Once | INTRAVENOUS | Status: AC
  Administered 2020-04-17: 510 mg via INTRAVENOUS
  Filled 2020-04-17: qty 17

## 2020-04-17 NOTE — Progress Notes (Signed)
Iron infusion given per orders. Patient tolerated it well without problems. Vitals stable and discharged home from clinic ambulatory in stable condition. Follow up as scheduled.  

## 2020-04-17 NOTE — Patient Instructions (Signed)
[facility] at [facility]  Discharge Instructions:   _______________________________________________________________  Thank you for choosing [facility] at [facility] to provide your oncology and hematology care.  To afford each patient quality time with our providers, please arrive at least 15 minutes before your scheduled appointment.  You need to re-schedule your appointment if you arrive 10 or more minutes late.  We strive to give you quality time with our providers, and arriving late affects you and other patients whose appointments are after yours.  Also, if you no show three or more times for appointments you may be dismissed from the clinic.  Again, thank you for choosing  Cancer Center at [facility]. Our hope is that these requests will allow you access to exceptional care and in a timely manner. _______________________________________________________________  If you have questions after your visit, please contact our office at [phone] between the hours of 8:30 a.m. and 5:00 p.m. Voicemails left after 4:30 p.m. will not be returned until the following business day. _______________________________________________________________  For prescription refill requests, have your pharmacy contact our office. _______________________________________________________________  Recommendations made by the consultant and any test results will be sent to your referring physician. _______________________________________________________________ 

## 2020-04-20 LAB — METHYLMALONIC ACID, SERUM: Methylmalonic Acid, Quantitative: 186 nmol/L (ref 0–378)

## 2020-04-20 LAB — COPPER, SERUM: Copper: 101 ug/dL (ref 69–132)

## 2020-04-24 ENCOUNTER — Inpatient Hospital Stay (HOSPITAL_COMMUNITY): Payer: Medicare Other | Attending: Hematology

## 2020-04-24 ENCOUNTER — Other Ambulatory Visit: Payer: Self-pay

## 2020-04-24 ENCOUNTER — Encounter (HOSPITAL_COMMUNITY): Payer: Self-pay

## 2020-04-24 VITALS — BP 115/57 | HR 71 | Temp 96.9°F | Resp 18

## 2020-04-24 DIAGNOSIS — D509 Iron deficiency anemia, unspecified: Secondary | ICD-10-CM | POA: Diagnosis not present

## 2020-04-24 DIAGNOSIS — K922 Gastrointestinal hemorrhage, unspecified: Secondary | ICD-10-CM

## 2020-04-24 DIAGNOSIS — D500 Iron deficiency anemia secondary to blood loss (chronic): Secondary | ICD-10-CM

## 2020-04-24 MED ORDER — SODIUM CHLORIDE 0.9 % IV SOLN: Freq: Once | INTRAVENOUS | Status: AC

## 2020-04-24 MED ORDER — SODIUM CHLORIDE 0.9 % IV SOLN
510.0000 mg | Freq: Once | INTRAVENOUS | Status: AC
  Administered 2020-04-24: 510 mg via INTRAVENOUS
  Filled 2020-04-24: qty 17

## 2020-04-24 NOTE — Progress Notes (Signed)
Feraheme given today per MD orders. Tolerated infusion without adverse affects. Vital signs stable. No complaints at this time. Discharged from clinic ambulatory in stable condition. Alert and oriented x 3. F/U with [facility] as scheduled.  

## 2020-04-24 NOTE — Patient Instructions (Signed)
[facility] at [facility]  Discharge Instructions:   _______________________________________________________________  Thank you for choosing [facility] at [facility] to provide your oncology and hematology care.  To afford each patient quality time with our providers, please arrive at least 15 minutes before your scheduled appointment.  You need to re-schedule your appointment if you arrive 10 or more minutes late.  We strive to give you quality time with our providers, and arriving late affects you and other patients whose appointments are after yours.  Also, if you no show three or more times for appointments you may be dismissed from the clinic.  Again, thank you for choosing [facility] at [facility]. Our hope is that these requests will allow you access to exceptional care and in a timely manner. _______________________________________________________________  If you have questions after your visit, please contact our office at [phone] between the hours of 8:30 a.m. and 5:00 p.m. Voicemails left after 4:30 p.m. will not be returned until the following business day. _______________________________________________________________  For prescription refill requests, have your pharmacy contact our office. _______________________________________________________________  Recommendations made by the consultant and any test results will be sent to your referring physician. _______________________________________________________________ 

## 2020-05-26 ENCOUNTER — Other Ambulatory Visit: Payer: Self-pay

## 2020-05-26 ENCOUNTER — Inpatient Hospital Stay (HOSPITAL_COMMUNITY): Payer: Medicare Other | Attending: Hematology

## 2020-05-26 ENCOUNTER — Ambulatory Visit (HOSPITAL_COMMUNITY)
Admission: RE | Admit: 2020-05-26 | Discharge: 2020-05-26 | Disposition: A | Discharge status: Home / Self Care | Payer: Medicare Other | Source: Ambulatory Visit | Attending: Hematology | Admitting: Hematology

## 2020-05-26 DIAGNOSIS — Z87891 Personal history of nicotine dependence: Secondary | ICD-10-CM | POA: Insufficient documentation

## 2020-05-26 DIAGNOSIS — Z794 Long term (current) use of insulin: Secondary | ICD-10-CM | POA: Insufficient documentation

## 2020-05-26 DIAGNOSIS — D509 Iron deficiency anemia, unspecified: Secondary | ICD-10-CM | POA: Diagnosis not present

## 2020-05-26 DIAGNOSIS — Z79899 Other long term (current) drug therapy: Secondary | ICD-10-CM | POA: Insufficient documentation

## 2020-05-26 DIAGNOSIS — I10 Essential (primary) hypertension: Secondary | ICD-10-CM | POA: Diagnosis not present

## 2020-05-26 DIAGNOSIS — Z122 Encounter for screening for malignant neoplasm of respiratory organs: Secondary | ICD-10-CM | POA: Diagnosis not present

## 2020-05-26 DIAGNOSIS — F17210 Nicotine dependence, cigarettes, uncomplicated: Secondary | ICD-10-CM | POA: Diagnosis not present

## 2020-05-26 DIAGNOSIS — Z86010 Personal history of colonic polyps: Secondary | ICD-10-CM | POA: Diagnosis not present

## 2020-05-26 DIAGNOSIS — E119 Type 2 diabetes mellitus without complications: Secondary | ICD-10-CM | POA: Diagnosis not present

## 2020-05-26 LAB — CBC WITH DIFFERENTIAL/PLATELET
Abs Immature Granulocytes: 0.04 10*3/uL (ref 0.00–0.07)
Basophils Absolute: 0 10*3/uL (ref 0.0–0.1)
Basophils Relative: 1 %
Eosinophils Absolute: 0.1 10*3/uL (ref 0.0–0.5)
Eosinophils Relative: 2 %
HCT: 38.2 % — ABNORMAL LOW (ref 39.0–52.0)
Hemoglobin: 12.3 g/dL — ABNORMAL LOW (ref 13.0–17.0)
Immature Granulocytes: 1 %
Lymphocytes Relative: 38 %
Lymphs Abs: 1.6 10*3/uL (ref 0.7–4.0)
MCH: 27.9 pg (ref 26.0–34.0)
MCHC: 32.2 g/dL (ref 30.0–36.0)
MCV: 86.6 fL (ref 80.0–100.0)
Monocytes Absolute: 0.2 10*3/uL (ref 0.1–1.0)
Monocytes Relative: 6 %
Neutro Abs: 2.2 10*3/uL (ref 1.7–7.7)
Neutrophils Relative %: 52 %
Platelets: 223 10*3/uL (ref 150–400)
RBC: 4.41 MIL/uL (ref 4.22–5.81)
RDW: 18.6 % — ABNORMAL HIGH (ref 11.5–15.5)
WBC: 4.2 10*3/uL (ref 4.0–10.5)
nRBC: 0 % (ref 0.0–0.2)

## 2020-06-02 ENCOUNTER — Inpatient Hospital Stay (HOSPITAL_COMMUNITY): Payer: Medicare Other | Admitting: Hematology

## 2020-06-22 ENCOUNTER — Inpatient Hospital Stay (HOSPITAL_BASED_OUTPATIENT_CLINIC_OR_DEPARTMENT_OTHER): Payer: Medicare Other | Admitting: Hematology

## 2020-06-22 ENCOUNTER — Other Ambulatory Visit: Payer: Self-pay

## 2020-06-22 VITALS — BP 120/59 | HR 84 | Temp 97.1°F | Resp 17 | Wt 267.2 lb

## 2020-06-22 DIAGNOSIS — D500 Iron deficiency anemia secondary to blood loss (chronic): Secondary | ICD-10-CM

## 2020-06-22 DIAGNOSIS — D509 Iron deficiency anemia, unspecified: Secondary | ICD-10-CM | POA: Diagnosis not present

## 2020-06-22 NOTE — Patient Instructions (Addendum)
[facility] at [facility] Discharge Instructions  You were seen today by Dr. Ellin Saba. He went over your recent results and scans. You will be scheduled for 2 more Feraheme infusions. Dr. Ellin Saba will see you back in 3 months for labs and follow up.   Thank you for choosing [facility] at [facility] to provide your oncology and hematology care.  To afford each patient quality time with our provider, please arrive at least 15 minutes before your scheduled appointment time.   If you have a lab appointment with the Cancer Center please come in thru the Main Entrance and check in at the main information desk  You need to re-schedule your appointment should you arrive 10 or more minutes late.  We strive to give you quality time with our providers, and arriving late affects you and other patients whose appointments are after yours.  Also, if you no show three or more times for appointments you may be dismissed from the clinic at the providers discretion.     Again, thank you for choosing [facility].  Our hope is that these requests will decrease the amount of time that you wait before being seen by our physicians.       _____________________________________________________________  Should you have questions after your visit to [facility], please contact our office at [phone] between the hours of 8:00 a.m. and 4:30 p.m.  Voicemails left after 4:00 p.m. will not be returned until the following business day.  For prescription refill requests, have your pharmacy contact our office and allow 72 hours.    Cancer Center Support Programs:   > Cancer Support Group  2nd Tuesday of the month 1pm-2pm, Journey Room

## 2020-06-22 NOTE — Progress Notes (Signed)
[facility] [address], [place] [address]   CLINIC:  Medical Oncology/Hematology  PCP:  Pearson Grippe, MD [address] [place] [address]  [phone]  REASON FOR VISIT:  Follow-up for IDA  PRIOR THERAPY: Givens Capsule study on 02/26/2020  CURRENT THERAPY: Intermittent Feraheme last on 04/24/2020  INTERVAL HISTORY:  [name], a 74 y.o. male, returns for routine follow-up for his IDA. Aleister was last seen on 04/14/2020.  Today he reports feeling okay. He denies having an improvement in his energy or breathing since getting the 2 Feraheme infusions; he tolerated the infusion well. He admits to having intermittent melena but denies seeing frank blood or taking iron tablets. He reports having occasional swelling in his ankles and chronic pain in his right shoulder and back, but denies having any new pains.   REVIEW OF SYSTEMS:  Review of Systems  Constitutional: Positive for fatigue (75%). Negative for appetite change.  Cardiovascular: Positive for leg swelling (occasional).  Gastrointestinal: Positive for blood in stool (melena).  Musculoskeletal: Positive for back pain (8/10 R shoulder & back pain).  All other systems reviewed and are negative.   PAST MEDICAL/SURGICAL HISTORY:  Past Medical History:  Diagnosis Date   ASCVD (arteriosclerotic cardiovascular disease)    S/P CABG surgery  in 2004; cardiac cath in 2005 revealed patent grafts.   BPH (benign prostatic hypertrophy)    Carotid artery occlusion    Cerebrovascular disease    bilateral bruits   DM (diabetes mellitus) (HCC)    AODM-- insulin requiring for the past 70yrs   Hx of adenomatous colonic polyps    Hyperlipidemia    Hypertension    Tobacco abuse    50 pack years; current 1 pack per day   Past Surgical History:  Procedure Laterality Date   BIOPSY  01/28/2020   Procedure: BIOPSY;  Surgeon: Corbin Ade, MD;  [facility];  Service:  Endoscopy;;   CERVICAL DISCECTOMY  1996   anterior cervical spine discectomy and fusion   CHOLECYSTECTOMY  2005   COLONOSCOPY W/ POLYPECTOMY     4 adenomatous polyps   COLONOSCOPY WITH PROPOFOL N/A 01/29/2020   normal colon, normal appearing TI.    CORONARY ARTERY BYPASS GRAFT     ESOPHAGOGASTRODUODENOSCOPY  07/04/2007   Dr. Darrick Penna; normal esophagus, erythema/edema in gastric antrum s/p biopsied, duodenitis.  Pathology positive for H. pylori.   ESOPHAGOGASTRODUODENOSCOPY (EGD) WITH PROPOFOL N/A 01/28/2020    mild erosive reflux esophagitis, s/p biopsy of gastric mucosa, normal duodenum overall except for minimally eroded second portion of mucosa s/p biopsy. Pathology: slight chronic gastritis, negative H.pylori, benign duodenal mucosa, no sprue.    GIVENS CAPSULE STUDY N/A 02/26/2020   Procedure: GIVENS CAPSULE STUDY;  Surgeon: Lanelle Bal, DO;  [facility];  Service: Endoscopy;  Laterality: N/A;  7:30am   GIVENS CAPSULE STUDY N/A 03/12/2020   Procedure: GIVENS CAPSULE STUDY;  Surgeon: Corbin Ade, MD;  [facility];  Service: Endoscopy;  Laterality: N/A;  7:30am, pt to arrive early to receive meds 30 mins prior to capsule    SOCIAL HISTORY:  Social History   Socioeconomic History   Marital status: Married    Spouse name: valeria   Number of children: 3   Years of education: Not on file   Highest education level: Not on file  Occupational History   Occupation: disabled  Tobacco Use   Smoking status: Current Every Day  Smoker    Packs/day: 1.50    Years: 50.00    Pack years: 75.00    Types: Cigarettes    Start date: 09/05/1960   Smokeless tobacco: Never Used  Vaping Use   Vaping Use: Never used  Substance and Sexual Activity   Alcohol use: No    Comment: quit 1980's   Drug use: No   Sexual activity: Not on file  Other Topics Concern   Not on file  Social History Narrative   Not on file   Social Determinants of Health    Financial Resource Strain:    Difficulty of Paying Living Expenses: Not on file  Food Insecurity:    Worried About Running Out of Food in the Last Year: Not on file   [employer] of Food in the Last Year: Not on file  Transportation Needs:    Lack of Transportation (Medical): Not on file   Lack of Transportation (Non-Medical): Not on file  Physical Activity:    Days of Exercise per Week: Not on file   Minutes of Exercise per Session: Not on file  Stress:    Feeling of Stress : Not on file  Social Connections:    Frequency of Communication with Friends and Family: Not on file   Frequency of Social Gatherings with Friends and Family: Not on file   Attends Religious Services: Not on file   Active Member of Clubs or Organizations: Not on file   Attends Banker Meetings: Not on file   Marital Status: Not on file  Intimate Partner Violence:    Fear of Current or Ex-Partner: Not on file   Emotionally Abused: Not on file   Physically Abused: Not on file   Sexually Abused: Not on file    FAMILY HISTORY:  Family History  Problem Relation Age of Onset   Pneumonia Mother 40       complications of diabetes   Diabetes Mother    Pneumonia Father 8       complication of pneumonia   Cancer Sister        breast   Cancer Sister        breast cancer   Colon cancer Neg Hx     CURRENT MEDICATIONS:  Current Outpatient Medications  Medication Sig Dispense Refill   albuterol (VENTOLIN HFA) 108 (90 Base) MCG/ACT inhaler Inhale 1-2 puffs into the lungs every 6 (six) hours as needed for wheezing or shortness of breath.      amitriptyline (ELAVIL) 25 MG tablet Take 25 mg by mouth at bedtime.     amLODipine (NORVASC) 10 MG tablet Take 1 tablet (10 mg total) by mouth daily. 30 tablet 5   aspirin 81 MG tablet Take 1 tablet (81 mg total) by mouth daily with breakfast. 30 tablet 1   cholecalciferol (VITAMIN D3) 25 MCG (1000 UNIT) tablet Take 2,000 Units by  mouth every morning.      cyanocobalamin (,VITAMIN B-12,) 1000 MCG/ML injection Inject 1 mL (1,000 mcg total) into the muscle every 30 (thirty) days. 1 mL 5   dorzolamide-timolol (COSOPT) 22.3-6.8 MG/ML ophthalmic solution Place 1 drop into the right eye 2 (two) times daily.     finasteride (PROSCAR) 5 MG tablet Take 5 mg by mouth at bedtime.      fish oil-omega-3 fatty acids 1000 MG capsule Take 3 g by mouth 2 (two) times daily.      gabapentin (NEURONTIN) 100 MG capsule Take 200 mg by mouth 2 (two)  times daily.      insulin glargine (LANTUS) 100 UNIT/ML injection Inject 30 Units into the skin in the morning and at bedtime.     insulin NPH (HUMULIN N,NOVOLIN N) 100 UNIT/ML injection Inject 6 Units into the skin daily as needed (for blood sugar levels over 250).      losartan (COZAAR) 25 MG tablet Take 1 tablet (25 mg total) by mouth daily. 90 tablet 2   pravastatin (PRAVACHOL) 80 MG tablet Take 1 tablet (80 mg total) by mouth daily. 90 tablet 1   prazosin (MINIPRESS) 2 MG capsule Take 2 mg by mouth at bedtime.     torsemide (DEMADEX) 20 MG tablet Take 20 mg by mouth daily.     traZODone (DESYREL) 50 MG tablet Take 50 mg by mouth at bedtime.       nitroGLYCERIN (NITROSTAT) 0.4 MG SL tablet Place 1 tablet (0.4 mg total) under the tongue every 5 (five) minutes as needed for chest pain. 25 tablet 3   No current facility-administered medications for this visit.    ALLERGIES:  Allergies  Allergen Reactions   Lisinopril Cough    PHYSICAL EXAM:  Performance status (ECOG): 1 - Symptomatic but completely ambulatory  Vitals:   06/22/20 0803  BP: (!) 120/59  Pulse: 84  Resp: 17  Temp: (!) 97.1 F (36.2 C)  SpO2: 100%   Wt Readings from Last 3 Encounters:  06/22/20 267 lb 3.2 oz (121.2 kg)  04/14/20 269 lb 11.2 oz (122.3 kg)  03/23/20 267 lb 3.2 oz (121.2 kg)   Physical Exam Vitals reviewed.  Constitutional:      Appearance: Normal appearance. He is obese.   Cardiovascular:     Rate and Rhythm: Normal rate and regular rhythm.     Pulses: Normal pulses.     Heart sounds: Normal heart sounds.  Pulmonary:     Effort: Pulmonary effort is normal.     Breath sounds: Normal breath sounds.  Musculoskeletal:     Right lower leg: No edema.     Left lower leg: Edema (trace) present.  Neurological:     General: No focal deficit present.     Mental Status: He is alert and oriented to person, place, and time.  Psychiatric:        Mood and Affect: Mood normal.        Behavior: Behavior normal.     LABORATORY DATA:  I have reviewed the labs as listed.  CBC Latest Ref Rng & Units 05/26/2020 04/14/2020 02/19/2020  WBC 4.0 - 10.5 K/uL 4.2 4.2 5.5  Hemoglobin 13.0 - 17.0 g/dL 12.3(L) 10.9(L) 8.6(L)  Hematocrit 39 - 52 % 38.2(L) 35.9(L) 30.5(L)  Platelets 150 - 400 K/uL 223 232 351   CMP Latest Ref Rng & Units 01/29/2020 01/28/2020 01/27/2020  Glucose 70 - 99 mg/dL 16(X68(L) 09(U62(L) 045(W142(H)  BUN 8 - 23 mg/dL 10 14 17   Creatinine 0.61 - 1.24 mg/dL 0.981.24 1.19(J1.38(H) 4.78(G1.44(H)  Sodium 135 - 145 mmol/L 140 140 139  Potassium 3.5 - 5.1 mmol/L 3.5 3.6 4.0  Chloride 98 - 111 mmol/L 110 108 107  CO2 22 - 32 mmol/L 23 24 24   Calcium 8.9 - 10.3 mg/dL 8.3(L) 8.4(L) 8.7(L)  Total Protein 6.5 - 8.1 g/dL - - 6.9  Total Bilirubin 0.3 - 1.2 mg/dL - - 9.5(A0.2(L)  Alkaline Phos 38 - 126 U/L - - 35(L)  AST 15 - 41 U/L - - 12(L)  ALT 0 - 44 U/L - - 13  Component Value Date/Time   RBC 4.41 05/26/2020 0855   MCV 86.6 05/26/2020 0855   MCH 27.9 05/26/2020 0855   MCHC 32.2 05/26/2020 0855   RDW 18.6 (H) 05/26/2020 0855   LYMPHSABS 1.6 05/26/2020 0855   MONOABS 0.2 05/26/2020 0855   EOSABS 0.1 05/26/2020 0855   BASOSABS 0.0 05/26/2020 0855   Lab Results  Component Value Date   TIBC 421 04/14/2020   TIBC CANCELED 02/19/2020   TIBC 542 (H) 01/27/2020   FERRITIN 8 (L) 04/14/2020   FERRITIN 2 (L) 02/19/2020   FERRITIN 2 (L) 01/27/2020   IRONPCTSAT 14 (L) 04/14/2020   IRONPCTSAT  4 (L) 01/27/2020    DIAGNOSTIC IMAGING:  I have independently reviewed the scans and discussed with the patient. CT CHEST LUNG CANCER SCREENING LOW DOSE WO CONTRAST  Result Date: 05/26/2020 CLINICAL DATA:  74 year old male with 75 pack-year history of smoking. Lung cancer screening. EXAM: CT CHEST WITHOUT CONTRAST LOW-DOSE FOR LUNG CANCER SCREENING TECHNIQUE: Multidetector CT imaging of the chest was performed following the standard protocol without IV contrast. COMPARISON:  No comparison studies available. FINDINGS: Cardiovascular: The heart size is normal. No substantial pericardial effusion. Coronary artery calcification is evident. Atherosclerotic calcification is noted in the wall of the thoracic aorta. Status post CABG. Mediastinum/Nodes: No mediastinal lymphadenopathy. No evidence for gross hilar lymphadenopathy although assessment is limited by the lack of intravenous contrast on today's study. The esophagus has normal imaging features. There is no axillary lymphadenopathy. Lungs/Pleura: Centrilobular emphsyema noted. No suspicious pulmonary nodule or mass. No focal airspace consolidation. No pleural effusion. Upper Abdomen: Unremarkable. Musculoskeletal: No worrisome lytic or sclerotic osseous abnormality. IMPRESSION: 1. Lung-RADS 1, negative. Continue annual screening with low-dose chest CT without contrast in 12 months. 2. Aortic Atherosclerosis (ICD10-I70.0) and Emphysema (ICD10-J43.9). Electronically Signed   By: Kennith Center M.D.   On: 05/26/2020 11:30     ASSESSMENT:  1.  Microcytic anemia: -Admitted to the hospital from 01/27/2020 through 01/29/2020 with dizziness and dyspnea on exertion. -Found to have hemoglobin 5.6, received blood transfusions. -History of GI bleed in October 2020 at [facility] in [place], required 5 units of PRBC. -EGD on 01/28/2020 and colonoscopy on 01/29/2019 were nonrevealing. -Capsule study on 03/12/2020, stomach with several images of blood flecks or wisps of blood  without obvious source.  Multiple images of duodenal, proximal jejunal areas with what appears lymphangiectasia's without active bleeding.  Further along in the study, images with possible erosions/ulcers. -Feraheme infusion on 04/17/2020 on 04/24/2020.  2.  Social/family history: -1 and half pack per day current active smoker for 56 years. -2 sisters had breast cancer.   PLAN:  1.  Microcytic anemia: -He did not notice any improvement in his energy levels after last Feraheme infusion. -We reviewed labs from 04/14/2020.  Other nutritional deficiency work-up was negative.  SPEP was negative. -CBC from 05/26/2020 showed improvement in hemoglobin to 12.3 from 10.9 previously. -He is continuing to have black stools. -I have recommended 2 more infusions of Feraheme. -Follow-up in 3 months with repeat labs including CBC, ferritin and iron panel.  2.  Smoking history: -Reviewed results of CT chest from 05/26/2020 which was lung RADS 1.  Orders placed this encounter:  Orders Placed This Encounter  Procedures   CBC with Differential/Platelet   Ferritin   Iron and TIBC     Doreatha Massed, MD [facility] [phone]   I, Drue Second, am acting as a scribe for Dr. Payton Mccallum.  Margaret Pyle MD,  have reviewed the above documentation for accuracy and completeness, and I agree with the above.

## 2020-06-26 ENCOUNTER — Inpatient Hospital Stay (HOSPITAL_COMMUNITY): Payer: Medicare Other | Attending: Hematology

## 2020-06-26 ENCOUNTER — Other Ambulatory Visit: Payer: Self-pay

## 2020-06-26 VITALS — BP 131/56 | HR 66 | Temp 96.8°F | Resp 18

## 2020-06-26 DIAGNOSIS — D509 Iron deficiency anemia, unspecified: Secondary | ICD-10-CM | POA: Diagnosis present

## 2020-06-26 DIAGNOSIS — D500 Iron deficiency anemia secondary to blood loss (chronic): Secondary | ICD-10-CM

## 2020-06-26 DIAGNOSIS — K922 Gastrointestinal hemorrhage, unspecified: Secondary | ICD-10-CM

## 2020-06-26 MED ORDER — SODIUM CHLORIDE 0.9 % IV SOLN: INTRAVENOUS | Status: DC

## 2020-06-26 MED ORDER — SODIUM CHLORIDE 0.9 % IV SOLN
510.0000 mg | Freq: Once | INTRAVENOUS | Status: AC
  Administered 2020-06-26: 510 mg via INTRAVENOUS
  Filled 2020-06-26: qty 510

## 2020-06-26 NOTE — Patient Instructions (Signed)
[facility] Discharge Instructions for Patients   Today you received the following Feraheme  To help prevent nausea and vomiting after your treatment, we encourage you to take your nausea medication    If you develop nausea and vomiting that is not controlled by your nausea medication, call the clinic.   BELOW ARE SYMPTOMS THAT SHOULD BE REPORTED IMMEDIATELY:  *FEVER GREATER THAN 100.5 F  *CHILLS WITH OR WITHOUT FEVER  NAUSEA AND VOMITING THAT IS NOT CONTROLLED WITH YOUR NAUSEA MEDICATION  *UNUSUAL SHORTNESS OF BREATH  *UNUSUAL BRUISING OR BLEEDING  TENDERNESS IN MOUTH AND THROAT WITH OR WITHOUT PRESENCE OF ULCERS  *URINARY PROBLEMS  *BOWEL PROBLEMS  UNUSUAL RASH Items with * indicate a potential emergency and should be followed up as soon as possible.  Feel free to call the clinic should you have any questions or concerns. The clinic phone number is [phone].  Please show the CHEMO ALERT CARD at check-in to the Emergency Department and triage nurse.   

## 2020-06-26 NOTE — Progress Notes (Signed)
Patient presents today for Feraheme infusion.  Vital signs stable.  No new complaints since last visit.  Feraheme infusion given today per MD orders.  Tolerated infusion without adverse affects.  Vital signs stable.  No complaints at this time.  Discharge from clinic ambulatory in stable condition.  Alert and oriented X 3.  Follow up with [facility] as scheduled. 

## 2020-07-03 ENCOUNTER — Encounter (HOSPITAL_COMMUNITY): Payer: Self-pay

## 2020-07-03 ENCOUNTER — Other Ambulatory Visit: Payer: Self-pay

## 2020-07-03 ENCOUNTER — Inpatient Hospital Stay (HOSPITAL_COMMUNITY): Payer: Medicare Other

## 2020-07-03 VITALS — BP 138/78 | HR 82 | Resp 18

## 2020-07-03 DIAGNOSIS — D500 Iron deficiency anemia secondary to blood loss (chronic): Secondary | ICD-10-CM

## 2020-07-03 DIAGNOSIS — K922 Gastrointestinal hemorrhage, unspecified: Secondary | ICD-10-CM

## 2020-07-03 DIAGNOSIS — D509 Iron deficiency anemia, unspecified: Secondary | ICD-10-CM | POA: Diagnosis not present

## 2020-07-03 MED ORDER — SODIUM CHLORIDE 0.9 % IV SOLN
510.0000 mg | Freq: Once | INTRAVENOUS | Status: AC
  Administered 2020-07-03: 510 mg via INTRAVENOUS
  Filled 2020-07-03: qty 510

## 2020-07-03 MED ORDER — SODIUM CHLORIDE 0.9 % IV SOLN: Freq: Once | INTRAVENOUS | Status: AC

## 2020-07-03 NOTE — Progress Notes (Signed)
Patient presents today for Feraheme infusion. Vital signs are stable. Patient states he is having right shoulder pain that he rates an 8/10 that's chronic. MAR reviewed and updated. Patient denies any significant changes since his last visit.

## 2020-07-03 NOTE — Patient Instructions (Signed)
[facility] Discharge Instructions for Patients   Today you received the following Feraheme  To help prevent nausea and vomiting after your treatment, we encourage you to take your nausea medication    If you develop nausea and vomiting that is not controlled by your nausea medication, call the clinic.   BELOW ARE SYMPTOMS THAT SHOULD BE REPORTED IMMEDIATELY:  *FEVER GREATER THAN 100.5 F  *CHILLS WITH OR WITHOUT FEVER  NAUSEA AND VOMITING THAT IS NOT CONTROLLED WITH YOUR NAUSEA MEDICATION  *UNUSUAL SHORTNESS OF BREATH  *UNUSUAL BRUISING OR BLEEDING  TENDERNESS IN MOUTH AND THROAT WITH OR WITHOUT PRESENCE OF ULCERS  *URINARY PROBLEMS  *BOWEL PROBLEMS  UNUSUAL RASH Items with * indicate a potential emergency and should be followed up as soon as possible.  Feel free to call the clinic should you have any questions or concerns. The clinic phone number is [phone].  Please show the CHEMO ALERT CARD at check-in to the Emergency Department and triage nurse.   

## 2020-07-03 NOTE — Progress Notes (Signed)
Feraheme infusion given today per MD orders.  Tolerated infusion without adverse affects.  Vital signs stable.  No complaints at this time.  Discharge from clinic ambulatory in stable condition.  Alert and oriented X 3.  Follow up with [facility] as scheduled. 

## 2020-08-03 IMAGING — US US CAROTID DUPLEX BILAT
1 series · 12 of 24 positions shown · non-contrast
Comparison: None.

CLINICAL DATA: 73-year-old male with a history

EXAM:
BILATERAL CAROTID DUPLEX ULTRASOUND
TECHNIQUE: Gray scale imaging, color Doppler and duplex ultrasound were
performed of bilateral carotid and vertebral arteries in the neck.

[Series 1: us carotid duplex bilat · 0.06mm/px · 12 of 97 slices shown]
[im 5/97]
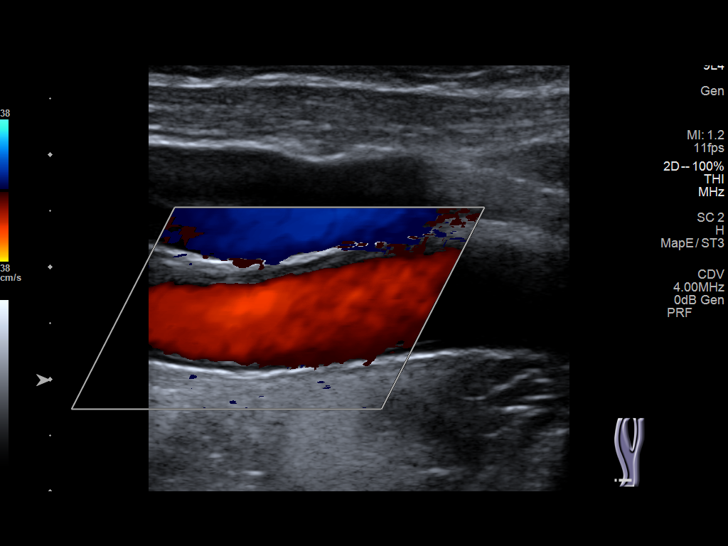
[im 13/97]
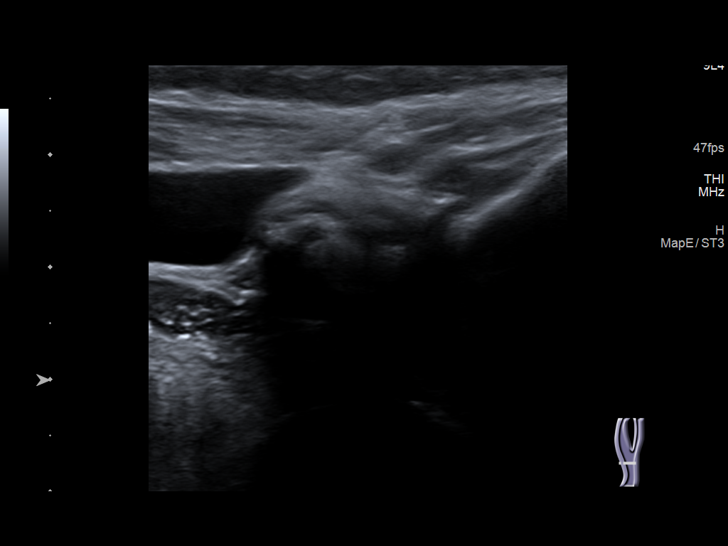
[im 21/97]
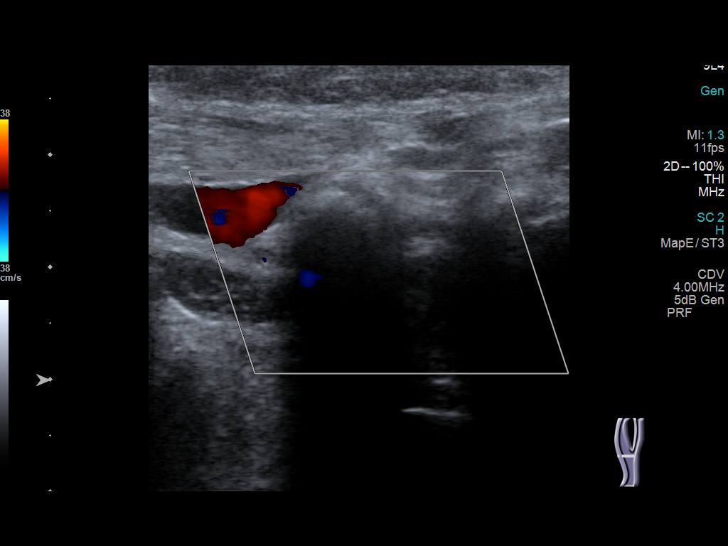
[im 30/97]
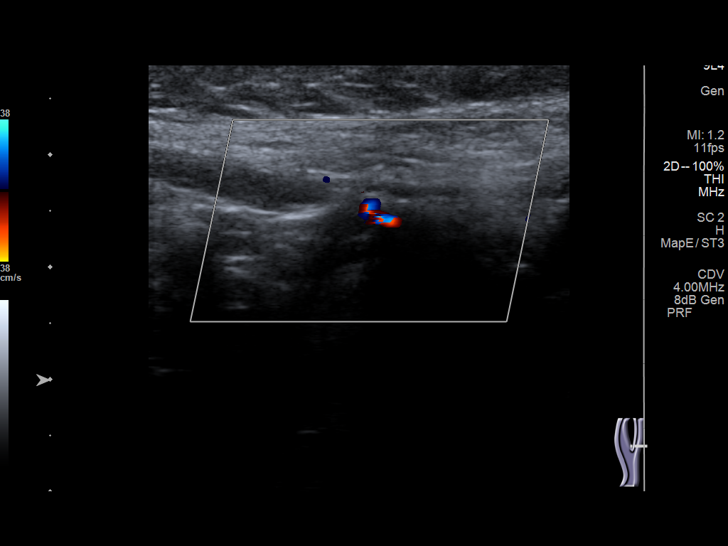
[im 38/97]
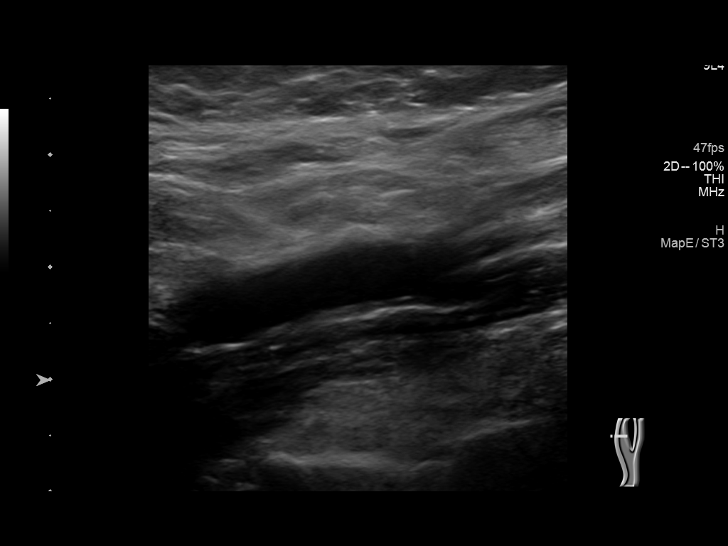
[im 46/97]
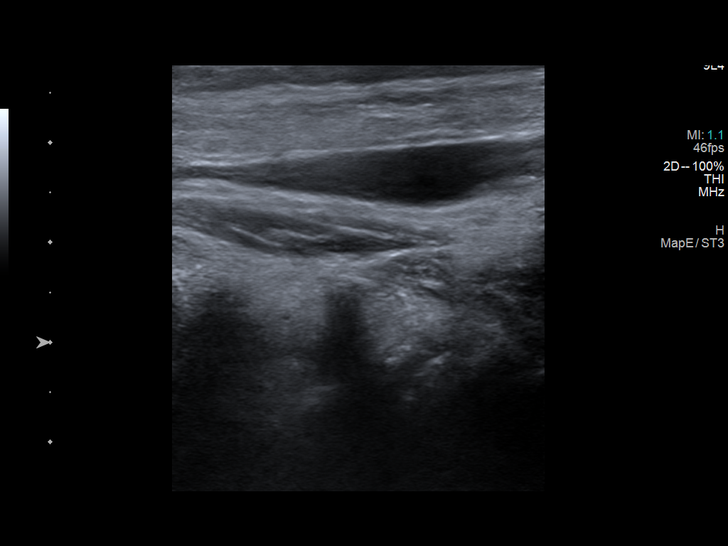
[im 55/97]
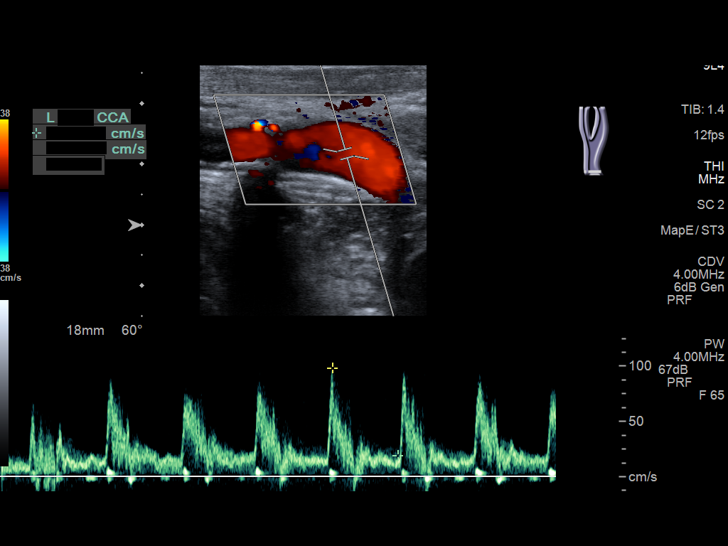
[im 63/97]
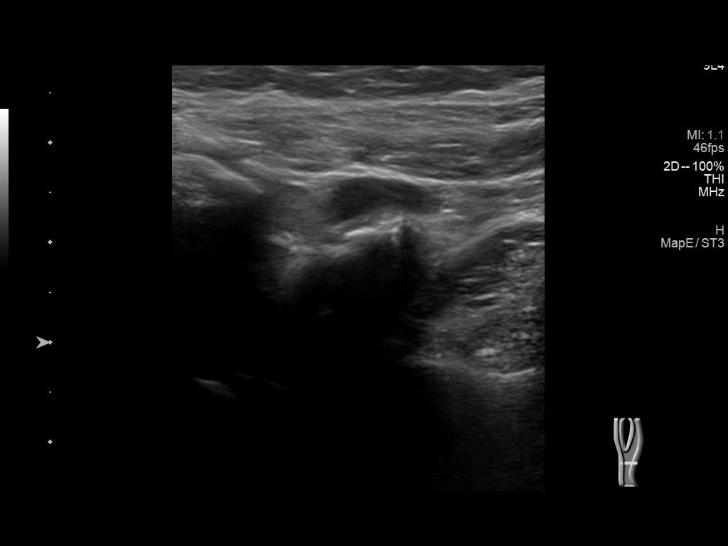
[im 71/97]
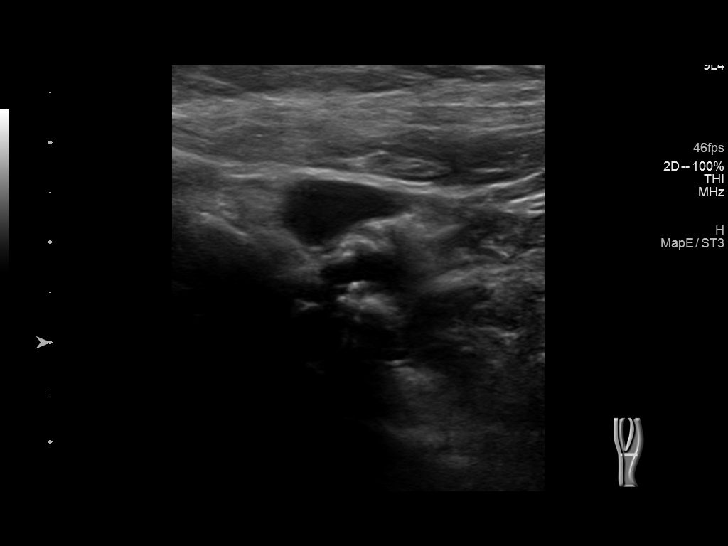
[im 80/97]
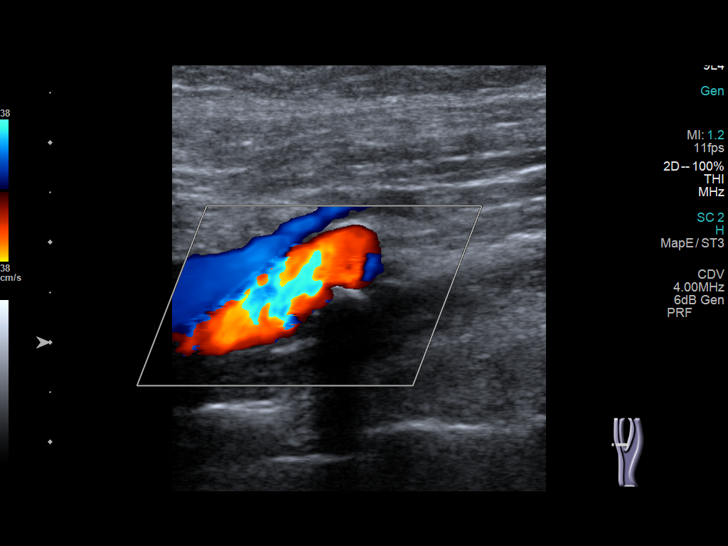
[im 88/97]
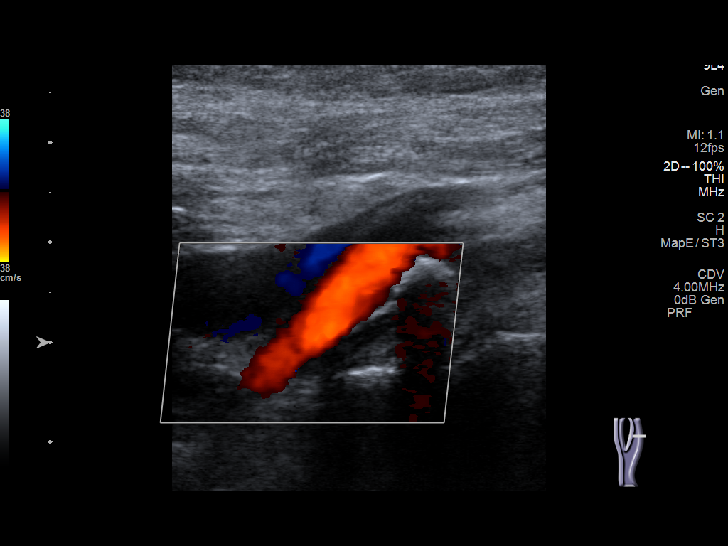
[im 97/97]
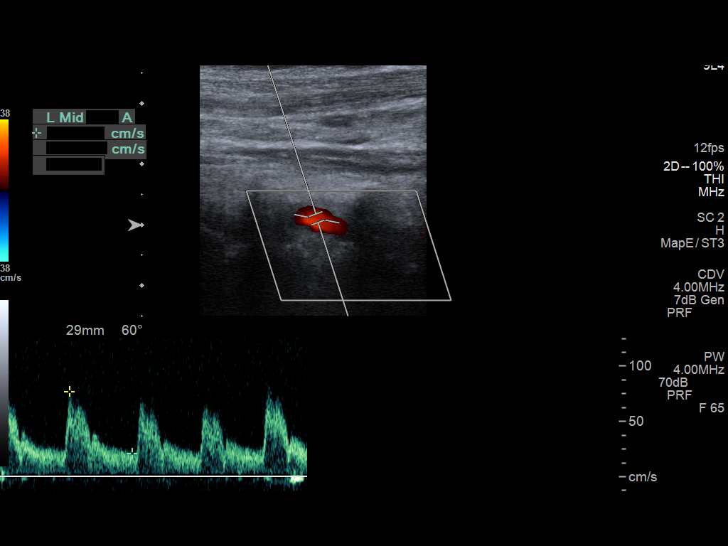

[12 of 24 positions shown; findings below may reference images not displayed]

FINDINGS: Criteria: Quantification of carotid stenosis is based on velocity
parameters that correlate the residual internal carotid diameter
with NASCET-based stenosis levels, using the diameter of the distal
internal carotid lumen as the denominator for stenosis measurement.

The following velocity measurements were obtained:

RIGHT

ICA:  Systolic 120 cm/sec, Diastolic 26 cm/sec

CCA:  53 cm/sec

SYSTOLIC ICA/CCA RATIO:

ECA:  87 cm/sec

LEFT

ICA:  Systolic 158 cm/sec, Diastolic 51 cm/sec

CCA:  82 cm/sec

SYSTOLIC ICA/CCA RATIO:

ECA:  293 cm/sec

Right Brachial SBP: Not acquired

Left Brachial SBP: Not acquired

RIGHT CAROTID ARTERY: No significant calcifications of the right
common carotid artery. Intermediate waveform maintained. Moderate
heterogeneous and partially calcified plaque at the right carotid
bifurcation. Lumen shadowing at the bifurcation. Low resistance
waveform of the right ICA. No significant tortuosity.

RIGHT VERTEBRAL ARTERY: Antegrade flow with low resistance waveform.

LEFT CAROTID ARTERY: No significant calcifications of the left
common carotid artery. Intermediate waveform maintained. Moderate
heterogeneous and partially calcified plaque at the left carotid
bifurcation. Shadowing at the bifurcation. Low resistance waveform
of the left ICA. No significant tortuosity.

LEFT VERTEBRAL ARTERY:  Antegrade flow with low resistance waveform.
IMPRESSION: Right:

Heterogeneous and partially calcified plaque at the right carotid
bifurcation, with discordant results regarding degree of stenosis by
established duplex criteria. Peak velocity suggests less than 50%
stenosis, with the ICA/ CCA ratio suggesting a lesser degree of
stenosis. Note that flow velocities of the right ICA were obtained
from an area distal to the maximum narrowing due to the presence of
anterior wall plaque with shadowing and may be underestimating the
percentage of ICA stenosis. if establishing a more accurate degree
of stenosis is required, cerebral angiogram should be considered, or
as a second best test, CTA.

Left:

Heterogeneous and partially calcified plaque at the left carotid
bifurcation, with discordant results regarding degree of stenosis by
established duplex criteria. Peak velocity suggests 50%-69%
stenosis, with the ICA/ CCA ratio suggesting a lesser degree of
stenosis. Note that the flow velocities of the left ICA were
obtained from an area distal to the maximum narrowing due to the
presence of anterior wall plaque with shadowing and may be
underestimating the percentage of ICA stenosis. If establishing a
more accurate degree of stenosis is required, cerebral angiogram
should be considered, or as a second best test, CTA.

## 2020-09-21 ENCOUNTER — Inpatient Hospital Stay (HOSPITAL_COMMUNITY): Payer: Medicare Other | Attending: Hematology

## 2020-09-21 ENCOUNTER — Other Ambulatory Visit: Payer: Self-pay

## 2020-09-21 DIAGNOSIS — D509 Iron deficiency anemia, unspecified: Secondary | ICD-10-CM | POA: Diagnosis present

## 2020-09-21 DIAGNOSIS — D500 Iron deficiency anemia secondary to blood loss (chronic): Secondary | ICD-10-CM

## 2020-09-21 LAB — CBC WITH DIFFERENTIAL/PLATELET
Abs Immature Granulocytes: 0.02 10*3/uL (ref 0.00–0.07)
Basophils Absolute: 0 10*3/uL (ref 0.0–0.1)
Basophils Relative: 1 %
Eosinophils Absolute: 0.1 10*3/uL (ref 0.0–0.5)
Eosinophils Relative: 2 %
HCT: 34.8 % — ABNORMAL LOW (ref 39.0–52.0)
Hemoglobin: 11.4 g/dL — ABNORMAL LOW (ref 13.0–17.0)
Immature Granulocytes: 1 %
Lymphocytes Relative: 39 %
Lymphs Abs: 1.7 10*3/uL (ref 0.7–4.0)
MCH: 29.7 pg (ref 26.0–34.0)
MCHC: 32.8 g/dL (ref 30.0–36.0)
MCV: 90.6 fL (ref 80.0–100.0)
Monocytes Absolute: 0.3 10*3/uL (ref 0.1–1.0)
Monocytes Relative: 6 %
Neutro Abs: 2.3 10*3/uL (ref 1.7–7.7)
Neutrophils Relative %: 51 %
Platelets: 229 10*3/uL (ref 150–400)
RBC: 3.84 MIL/uL — ABNORMAL LOW (ref 4.22–5.81)
RDW: 13.7 % (ref 11.5–15.5)
WBC: 4.4 10*3/uL (ref 4.0–10.5)
nRBC: 0 % (ref 0.0–0.2)

## 2020-09-21 LAB — IRON AND TIBC
Iron: 55 ug/dL (ref 45–182)
Saturation Ratios: 13 % — ABNORMAL LOW (ref 17.9–39.5)
TIBC: 413 ug/dL (ref 250–450)
UIBC: 358 ug/dL

## 2020-09-21 LAB — FERRITIN: Ferritin: 7 ng/mL — ABNORMAL LOW (ref 24–336)

## 2020-09-23 ENCOUNTER — Encounter: Payer: Self-pay | Admitting: Cardiology

## 2020-09-23 ENCOUNTER — Other Ambulatory Visit: Payer: Self-pay

## 2020-09-23 ENCOUNTER — Encounter: Payer: Self-pay | Admitting: *Deleted

## 2020-09-23 ENCOUNTER — Ambulatory Visit: Payer: Medicare Other | Admitting: Cardiology

## 2020-09-23 VITALS — BP 124/70 | HR 84 | Ht 73.0 in | Wt 269.6 lb

## 2020-09-23 DIAGNOSIS — I6523 Occlusion and stenosis of bilateral carotid arteries: Secondary | ICD-10-CM

## 2020-09-23 DIAGNOSIS — I10 Essential (primary) hypertension: Secondary | ICD-10-CM | POA: Diagnosis not present

## 2020-09-23 DIAGNOSIS — I2510 Atherosclerotic heart disease of native coronary artery without angina pectoris: Secondary | ICD-10-CM

## 2020-09-23 DIAGNOSIS — E782 Mixed hyperlipidemia: Secondary | ICD-10-CM

## 2020-09-23 NOTE — Patient Instructions (Signed)
Medication Instructions:  Your physician recommends that you continue on your current medications as directed. Please refer to the Current Medication list given to you today.  *If you need a refill on your cardiac medications before your next appointment, please call your pharmacy*   Lab Work: NONE  If you have labs (blood work) drawn today and your tests are completely normal, you will receive your results only by: . MyChart Message (if you have MyChart) OR . A paper copy in the mail If you have any lab test that is abnormal or we need to change your treatment, we will call you to review the results.   Testing/Procedures: Your physician has requested that you have a carotid duplex. This test is an ultrasound of the carotid arteries in your neck. It looks at blood flow through these arteries that supply the brain with blood. Allow one hour for this exam. There are no restrictions or special instructions.    Follow-Up: At [facility], you and your health needs are our priority.  As part of our continuing mission to provide you with exceptional heart care, we have created designated Provider Care Teams.  These Care Teams include your primary Cardiologist (physician) and Advanced Practice Providers (APPs -  Physician Assistants and Nurse Practitioners) who all work together to provide you with the care you need, when you need it.  We recommend signing up for the patient portal called "MyChart".  Sign up information is provided on this After Visit Summary.  MyChart is used to connect with patients for Virtual Visits (Telemedicine).  Patients are able to view lab/test results, encounter notes, upcoming appointments, etc.  Non-urgent messages can be sent to your provider as well.   To learn more about what you can do with MyChart, go to [email or web address].    Your next appointment:   6 month(s)  The format for your next appointment:   In Person  Provider:   Jonathan Branch,  MD   Other Instructions Thank you for choosing [facility]!    

## 2020-09-23 NOTE — Progress Notes (Signed)
Clinical Summary Mr. Woolbright is a 75 y.o.male seen today for follow up of the following medical problems.   1. CAD - history of CABG - underwent three-vessel coronary artery bypass graft surgery on 12/18/2002 with a LIMA to the LAD, left radial to the ramus intermedius, and SVG to circumflex marginal Claira Jeter.    - no recent chest pain -  No SOB/DOe - compliant with meds   2. HTN - he is compliant with meds     3. Hyperlipidemia - compliant with meds - labs followed by pcp  4. Cartoid stenosis. - 05/2019 RICA <50%, LICA 50-69% - no symptoms   5. Anemia, heme + stool - July 2021 Hgb 5.6 on admission. colonoscopy and EGD during admission without obvious source for bleeding - followed by GI and hematology  SH: former Hotel manager, Clinical cytogeneticist.   Past Medical History:  Diagnosis Date  . ASCVD (arteriosclerotic cardiovascular disease)    S/P CABG surgery  in 2004; cardiac cath in 2005 revealed patent grafts.  Marland Kitchen BPH (benign prostatic hypertrophy)   . Carotid artery occlusion   . Cerebrovascular disease    bilateral bruits  . DM (diabetes mellitus) (HCC)    AODM-- insulin requiring for the past 79yrs  . Hx of adenomatous colonic polyps   . Hyperlipidemia   . Hypertension   . Tobacco abuse    50 pack years; current 1 pack per day     Allergies  Allergen Reactions  . Lisinopril Cough     Current Outpatient Medications  Medication Sig Dispense Refill  . albuterol (VENTOLIN HFA) 108 (90 Base) MCG/ACT inhaler Inhale 1-2 puffs into the lungs every 6 (six) hours as needed for wheezing or shortness of breath.     Marland Kitchen amitriptyline (ELAVIL) 25 MG tablet Take 25 mg by mouth at bedtime.    Marland Kitchen amLODipine (NORVASC) 10 MG tablet Take 1 tablet (10 mg total) by mouth daily. 30 tablet 5  . aspirin 81 MG tablet Take 1 tablet (81 mg total) by mouth daily with breakfast. 30 tablet 1  . cholecalciferol (VITAMIN D3) 25 MCG (1000 UNIT) tablet Take 2,000 Units by mouth every  morning.     . cyanocobalamin (,VITAMIN B-12,) 1000 MCG/ML injection Inject 1 mL (1,000 mcg total) into the muscle every 30 (thirty) days. 1 mL 5  . dorzolamide-timolol (COSOPT) 22.3-6.8 MG/ML ophthalmic solution Place 1 drop into the right eye 2 (two) times daily.    . finasteride (PROSCAR) 5 MG tablet Take 5 mg by mouth at bedtime.    . fish oil-omega-3 fatty acids 1000 MG capsule Take 3 g by mouth 2 (two) times daily.    Marland Kitchen gabapentin (NEURONTIN) 100 MG capsule Take 200 mg by mouth 2 (two) times daily.     . insulin glargine (LANTUS) 100 UNIT/ML injection Inject 30 Units into the skin in the morning and at bedtime.    . insulin NPH (HUMULIN N,NOVOLIN N) 100 UNIT/ML injection Inject 6 Units into the skin daily as needed (for blood sugar levels over 250).    Marland Kitchen losartan (COZAAR) 25 MG tablet Take 1 tablet (25 mg total) by mouth daily. 90 tablet 2  . nitroGLYCERIN (NITROSTAT) 0.4 MG SL tablet Place 1 tablet (0.4 mg total) under the tongue every 5 (five) minutes as needed for chest pain. 25 tablet 3  . pravastatin (PRAVACHOL) 80 MG tablet Take 1 tablet (80 mg total) by mouth daily. 90 tablet 1  . prazosin (MINIPRESS) 2 MG capsule Take  2 mg by mouth at bedtime.    . torsemide (DEMADEX) 20 MG tablet Take 20 mg by mouth daily.    . traZODone (DESYREL) 50 MG tablet Take 50 mg by mouth at bedtime.     No current facility-administered medications for this visit.     Past Surgical History:  Procedure Laterality Date  . BIOPSY  01/28/2020   Procedure: BIOPSY;  Surgeon: Corbin Ade, MD;  [facility];  Service: Endoscopy;;  . CERVICAL DISCECTOMY  1996   anterior cervical spine discectomy and fusion  . CHOLECYSTECTOMY  2005  . COLONOSCOPY W/ POLYPECTOMY     4 adenomatous polyps  . COLONOSCOPY WITH PROPOFOL N/A 01/29/2020   normal colon, normal appearing TI.   Marland Kitchen CORONARY ARTERY BYPASS GRAFT    . ESOPHAGOGASTRODUODENOSCOPY  07/04/2007   Dr. Darrick Penna; normal esophagus, erythema/edema in  gastric antrum s/p biopsied, duodenitis.  Pathology positive for H. pylori.  . ESOPHAGOGASTRODUODENOSCOPY (EGD) WITH PROPOFOL N/A 01/28/2020    mild erosive reflux esophagitis, s/p biopsy of gastric mucosa, normal duodenum overall except for minimally eroded second portion of mucosa s/p biopsy. Pathology: slight chronic gastritis, negative H.pylori, benign duodenal mucosa, no sprue.   Marland Kitchen GIVENS CAPSULE STUDY N/A 02/26/2020   Procedure: GIVENS CAPSULE STUDY;  Surgeon: Lanelle Bal, DO;  [facility];  Service: Endoscopy;  Laterality: N/A;  7:30am  . GIVENS CAPSULE STUDY N/A 03/12/2020   Procedure: GIVENS CAPSULE STUDY;  Surgeon: Corbin Ade, MD;  [facility];  Service: Endoscopy;  Laterality: N/A;  7:30am, pt to arrive early to receive meds 30 mins prior to capsule     Allergies  Allergen Reactions  . Lisinopril Cough      Family History  Problem Relation Age of Onset  . Pneumonia Mother 67       complications of diabetes  . Diabetes Mother   . Pneumonia Father 90       complication of pneumonia  . Cancer Sister        breast  . Cancer Sister        breast cancer  . Colon cancer Neg Hx      Social History Mr. Ratterree reports that he has been smoking cigarettes. He started smoking about 60 years ago. He has a 75.00 pack-year smoking history. He has never used smokeless tobacco. Mr. Lahaie reports no history of alcohol use.   Review of Systems CONSTITUTIONAL: No weight loss, fever, chills, weakness or fatigue.  HEENT: Eyes: No visual loss, blurred vision, double vision or yellow sclerae.No hearing loss, sneezing, congestion, runny nose or sore throat.  SKIN: No rash or itching.  CARDIOVASCULAR: per hpi RESPIRATORY: No shortness of breath, cough or sputum.  GASTROINTESTINAL: No anorexia, nausea, vomiting or diarrhea. No abdominal pain or blood.  GENITOURINARY: No burning on urination, no polyuria NEUROLOGICAL: No headache, dizziness, syncope, paralysis,  ataxia, numbness or tingling in the extremities. No change in bowel or bladder control.  MUSCULOSKELETAL: No muscle, back pain, joint pain or stiffness.  LYMPHATICS: No enlarged nodes. No history of splenectomy.  PSYCHIATRIC: No history of depression or anxiety.  ENDOCRINOLOGIC: No reports of sweating, cold or heat intolerance. No polyuria or polydipsia.  Marland Kitchen   Physical Examination Today's Vitals   09/23/20 1336  BP: 124/70  Pulse: 84  SpO2: 96%  Weight: 269 lb 9.6 oz (122.3 kg)  Height: 6\' 1"  (1.854 m)   Body mass index is 35.57 kg/m.  Gen: resting comfortably, no  acute distress HEENT: no scleral icterus, pupils equal round and reactive, no palptable cervical adenopathy,  CV: RRR, no m/r/g, no jvd Resp: Clear to auscultation bilaterally GI: abdomen is soft, non-tender, non-distended, normal bowel sounds, no hepatosplenomegaly MSK: extremities are warm, no edema.  Skin: warm, no rash Neuro:  no focal deficits Psych: appropriate affect   Diagnostic Studies  Echocardiogram 05/31/2019:  1. Left ventricular ejection fraction, by visual estimation, is 60 to  65%. The left ventricle has normal function. There is moderately increased  left ventricular hypertrophy.  2. Left ventricular diastolic parameters are consistent with Grade II  diastolic dysfunction (pseudonormalization).  3. Global right ventricle has normal systolic function.The right  ventricular size is normal. No increase in right ventricular wall  thickness.  4. Left atrial size was mildly dilated.  5. Right atrial size was normal.  6. Presence of pericardial fat pad.  7. Mild aortic valve annular calcification.  8. Mild to moderate mitral annular calcification.  9. The mitral valve is grossly normal. Trace mitral valve regurgitation.  10. The tricuspid valve is grossly normal. Tricuspid valve regurgitation  is trivial.  11. The aortic valve is tricuspid. Aortic valve regurgitation is not  visualized.   12. The pulmonic valve was grossly normal. Pulmonic valve regurgitation is  trivial.  13. TR signal is inadequate for assessing pulmonary artery systolic  pressure.  14. The inferior vena cava is normal in size with greater than 50%  respiratory variability, suggesting right atrial pressure of 3 mmHg.    Assessment and Plan   1. CAD - no recent symptoms, continue current meds   2. HTN - bp at goal, continue current meds   3. Hyperlipidemia -continue statin, request pcp labs     4. Carotid stenosis Repeat carotid US    Antoine Poche, M.D.

## 2020-09-28 ENCOUNTER — Encounter (HOSPITAL_COMMUNITY): Payer: Self-pay | Admitting: Hematology

## 2020-09-28 ENCOUNTER — Inpatient Hospital Stay (HOSPITAL_COMMUNITY): Payer: Medicare Other | Attending: Hematology | Admitting: Hematology

## 2020-09-28 ENCOUNTER — Other Ambulatory Visit: Payer: Self-pay

## 2020-09-28 VITALS — BP 135/57 | HR 92 | Temp 97.0°F | Resp 19 | Wt 269.9 lb

## 2020-09-28 DIAGNOSIS — D509 Iron deficiency anemia, unspecified: Secondary | ICD-10-CM | POA: Insufficient documentation

## 2020-09-28 DIAGNOSIS — D500 Iron deficiency anemia secondary to blood loss (chronic): Secondary | ICD-10-CM

## 2020-09-28 DIAGNOSIS — Z803 Family history of malignant neoplasm of breast: Secondary | ICD-10-CM | POA: Insufficient documentation

## 2020-09-28 DIAGNOSIS — F17210 Nicotine dependence, cigarettes, uncomplicated: Secondary | ICD-10-CM | POA: Insufficient documentation

## 2020-09-28 NOTE — Progress Notes (Signed)
[facility] [address], [place] [address]   CLINIC:  Medical Oncology/Hematology  PCP:  Pearson Grippe, MD [address] [place] [address]  [phone]  REASON FOR VISIT:  Follow-up for iron deficiency anemia  PRIOR THERAPY: Intermittent IV Feraheme infusions  CURRENT THERAPY: Intermittent IV Feraheme infusions  INTERVAL HISTORY:  [name], a 75 y.o. male, returns for routine follow-up for his iron deficiency anemia. Kyle Daniel was last seen on 06/22/2020, and received IV Feraheme on 06/26/2020 and 07/03/2020.  Chronic iron deficiency anemia likely secondary to chronic slow GI bleed.  Patient has had previous GI work-up, with no specific source localized.  Continues to have melena about once per week, but denies bright red hematochezia or frank hemorrhage.  No hematuria, hemoptysis, or other sources of blood loss noted.  Ports that his energy has been better since the IV iron infusion in December.  Denies shortness of breath, chest pain, dyspnea on exertion, dizziness, headache, fever, chills, night sweats, abnormal weight loss.   REVIEW OF SYSTEMS:  Review of Systems  Constitutional: Negative for appetite change, chills, diaphoresis, fatigue, fever and unexpected weight change.  HENT:   Negative for lump/mass and nosebleeds.   Eyes: Negative for eye problems.  Respiratory: Negative for chest tightness, cough, hemoptysis and shortness of breath.   Cardiovascular: Negative for chest pain and leg swelling.  Gastrointestinal: Negative for abdominal pain, constipation, diarrhea, nausea and vomiting.  Neurological: Negative for dizziness, headaches and light-headedness.  Hematological: Negative for adenopathy.    PAST MEDICAL/SURGICAL HISTORY:  Past Medical History:  Diagnosis Date  . ASCVD (arteriosclerotic cardiovascular disease)    S/P CABG surgery  in 2004; cardiac cath in 2005 revealed patent grafts.  Marland Kitchen BPH (benign prostatic  hypertrophy)   . Carotid artery occlusion   . Cerebrovascular disease    bilateral bruits  . DM (diabetes mellitus) (HCC)    AODM-- insulin requiring for the past 73yrs  . Hx of adenomatous colonic polyps   . Hyperlipidemia   . Hypertension   . Tobacco abuse    50 pack years; current 1 pack per day   Past Surgical History:  Procedure Laterality Date  . BIOPSY  01/28/2020   Procedure: BIOPSY;  Surgeon: Corbin Ade, MD;  [facility];  Service: Endoscopy;;  . CERVICAL DISCECTOMY  1996   anterior cervical spine discectomy and fusion  . CHOLECYSTECTOMY  2005  . COLONOSCOPY W/ POLYPECTOMY     4 adenomatous polyps  . COLONOSCOPY WITH PROPOFOL N/A 01/29/2020   normal colon, normal appearing TI.   Marland Kitchen CORONARY ARTERY BYPASS GRAFT    . ESOPHAGOGASTRODUODENOSCOPY  07/04/2007   Dr. Darrick Penna; normal esophagus, erythema/edema in gastric antrum s/p biopsied, duodenitis.  Pathology positive for H. pylori.  . ESOPHAGOGASTRODUODENOSCOPY (EGD) WITH PROPOFOL N/A 01/28/2020    mild erosive reflux esophagitis, s/p biopsy of gastric mucosa, normal duodenum overall except for minimally eroded second portion of mucosa s/p biopsy. Pathology: slight chronic gastritis, negative H.pylori, benign duodenal mucosa, no sprue.   Marland Kitchen GIVENS CAPSULE STUDY N/A 02/26/2020   Procedure: GIVENS CAPSULE STUDY;  Surgeon: Lanelle Bal, DO;  [facility];  Service: Endoscopy;  Laterality: N/A;  7:30am  . GIVENS CAPSULE STUDY N/A 03/12/2020   Procedure: GIVENS CAPSULE STUDY;  Surgeon: Corbin Ade, MD;  [facility];  Service: Endoscopy;  Laterality: N/A;  7:30am, pt to arrive early to receive meds 30 mins prior to  capsule    SOCIAL HISTORY:  Social History   Socioeconomic History  . Marital status: Married    Spouse name: valeria  . Number of children: 3  . Years of education: Not on file  . Highest education level: Not on file  Occupational History  . Occupation: disabled  Tobacco Use   . Smoking status: Current Every Day Smoker    Packs/day: 1.50    Years: 50.00    Pack years: 75.00    Types: Cigarettes    Start date: 09/05/1960  . Smokeless tobacco: Never Used  Vaping Use  . Vaping Use: Never used  Substance and Sexual Activity  . Alcohol use: No    Comment: quit 1980's  . Drug use: No  . Sexual activity: Not on file  Other Topics Concern  . Not on file  Social History Narrative  . Not on file   Social Determinants of Health   Financial Resource Strain: Low Risk   . Difficulty of Paying Living Expenses: Not hard at all  Food Insecurity: No Food Insecurity  . Worried About Programme researcher, broadcasting/film/video in the Last Year: Never true  . Ran Out of Food in the Last Year: Never true  Transportation Needs: No Transportation Needs  . Lack of Transportation (Medical): No  . Lack of Transportation (Non-Medical): No  Physical Activity: Inactive  . Days of Exercise per Week: 0 days  . Minutes of Exercise per Session: 0 min  Stress: No Stress Concern Present  . Feeling of Stress : Only a little  Social Connections: Socially Integrated  . Frequency of Communication with Friends and Family: More than three times a week  . Frequency of Social Gatherings with Friends and Family: More than three times a week  . Attends Religious Services: 1 to 4 times per year  . Active Member of Clubs or Organizations: Yes  . Attends Banker Meetings: More than 4 times per year  . Marital Status: Married  Catering manager Violence: Not At Risk  . Fear of Current or Ex-Partner: No  . Emotionally Abused: No  . Physically Abused: No  . Sexually Abused: No    FAMILY HISTORY:  Family History  Problem Relation Age of Onset  . Pneumonia Mother 67       complications of diabetes  . Diabetes Mother   . Pneumonia Father 90       complication of pneumonia  . Cancer Sister        breast  . Cancer Sister        breast cancer  . Colon cancer Neg Hx     CURRENT MEDICATIONS:   Current Outpatient Medications  Medication Sig Dispense Refill  . albuterol (VENTOLIN HFA) 108 (90 Base) MCG/ACT inhaler Inhale 1-2 puffs into the lungs every 6 (six) hours as needed for wheezing or shortness of breath.     Marland Kitchen amitriptyline (ELAVIL) 25 MG tablet Take 25 mg by mouth at bedtime.    Marland Kitchen amLODipine (NORVASC) 10 MG tablet Take 1 tablet (10 mg total) by mouth daily. 30 tablet 5  . aspirin 81 MG tablet Take 1 tablet (81 mg total) by mouth daily with breakfast. 30 tablet 1  . cholecalciferol (VITAMIN D3) 25 MCG (1000 UNIT) tablet Take 2,000 Units by mouth every morning.     . cyanocobalamin (,VITAMIN B-12,) 1000 MCG/ML injection Inject 1 mL (1,000 mcg total) into the muscle every 30 (thirty) days. 1 mL 5  . dorzolamide-timolol (COSOPT) 22.3-6.8  MG/ML ophthalmic solution Place 1 drop into the right eye 2 (two) times daily.    . finasteride (PROSCAR) 5 MG tablet Take 5 mg by mouth at bedtime.    . fish oil-omega-3 fatty acids 1000 MG capsule Take 3 g by mouth 2 (two) times daily.    Marland Kitchen gabapentin (NEURONTIN) 100 MG capsule Take 200 mg by mouth 2 (two) times daily.     . insulin glargine (LANTUS) 100 UNIT/ML injection Inject 30 Units into the skin in the morning and at bedtime.    . insulin NPH (HUMULIN N,NOVOLIN N) 100 UNIT/ML injection Inject 6 Units into the skin daily as needed (for blood sugar levels over 250).    Marland Kitchen losartan (COZAAR) 25 MG tablet Take 1 tablet (25 mg total) by mouth daily. 90 tablet 2  . nitroGLYCERIN (NITROSTAT) 0.4 MG SL tablet Place 1 tablet (0.4 mg total) under the tongue every 5 (five) minutes as needed for chest pain. 25 tablet 3  . pravastatin (PRAVACHOL) 80 MG tablet Take 1 tablet (80 mg total) by mouth daily. 90 tablet 1  . prazosin (MINIPRESS) 2 MG capsule Take 2 mg by mouth at bedtime.    . torsemide (DEMADEX) 20 MG tablet Take 20 mg by mouth daily.    . traZODone (DESYREL) 50 MG tablet Take 50 mg by mouth at bedtime.     No current facility-administered  medications for this visit.    ALLERGIES:  Allergies  Allergen Reactions  . Lisinopril Cough and Other (See Comments)    PHYSICAL EXAM:  Performance status (ECOG): 1 - Symptomatic but completely ambulatory  Vitals:   09/28/20 1428  BP: (!) 135/57  Pulse: 92  Resp: 19  Temp: (!) 97 F (36.1 C)  SpO2: 95%   Wt Readings from Last 3 Encounters:  09/28/20 269 lb 14.4 oz (122.4 kg)  09/23/20 269 lb 9.6 oz (122.3 kg)  06/22/20 267 lb 3.2 oz (121.2 kg)   Physical Exam Constitutional:      Appearance: He is obese. He is not ill-appearing.  HENT:     Head: Normocephalic and atraumatic.     Mouth/Throat:     Mouth: Mucous membranes are moist.  Eyes:     Extraocular Movements: Extraocular movements intact.     Pupils: Pupils are equal, round, and reactive to light.  Cardiovascular:     Rate and Rhythm: Normal rate and regular rhythm.     Heart sounds: Normal heart sounds.  Pulmonary:     Effort: Pulmonary effort is normal.     Breath sounds: Normal breath sounds.  Abdominal:     General: Bowel sounds are normal. There is no distension.     Palpations: Abdomen is soft.  Musculoskeletal:        General: No swelling.     Right lower leg: No edema.     Left lower leg: No edema.  Skin:    General: Skin is warm and dry.  Neurological:     General: No focal deficit present.     Mental Status: He is alert and oriented to person, place, and time.  Psychiatric:        Mood and Affect: Mood normal.        Behavior: Behavior normal.     LABORATORY DATA:  I have reviewed the labs as listed.  CBC Latest Ref Rng & Units 09/21/2020 05/26/2020 04/14/2020  WBC 4.0 - 10.5 K/uL 4.4 4.2 4.2  Hemoglobin 13.0 - 17.0 g/dL 11.4(L) 12.3(L) 10.9(L)  Hematocrit 39.0 - 52.0 % 34.8(L) 38.2(L) 35.9(L)  Platelets 150 - 400 K/uL 229 223 232   CMP Latest Ref Rng & Units 01/29/2020 01/28/2020 01/27/2020  Glucose 70 - 99 mg/dL 16(X68(L) 09(U62(L) 045(W142(H)  BUN 8 - 23 mg/dL 10 14 17   Creatinine 0.61 - 1.24 mg/dL  0.981.24 1.19(J1.38(H) 4.78(G1.44(H)  Sodium 135 - 145 mmol/L 140 140 139  Potassium 3.5 - 5.1 mmol/L 3.5 3.6 4.0  Chloride 98 - 111 mmol/L 110 108 107  CO2 22 - 32 mmol/L 23 24 24   Calcium 8.9 - 10.3 mg/dL 8.3(L) 8.4(L) 8.7(L)  Total Protein 6.5 - 8.1 g/dL - - 6.9  Total Bilirubin 0.3 - 1.2 mg/dL - - 9.5(A0.2(L)  Alkaline Phos 38 - 126 U/L - - 35(L)  AST 15 - 41 U/L - - 12(L)  ALT 0 - 44 U/L - - 13      Component Value Date/Time   RBC 3.84 (L) 09/21/2020 1233   MCV 90.6 09/21/2020 1233   MCH 29.7 09/21/2020 1233   MCHC 32.8 09/21/2020 1233   RDW 13.7 09/21/2020 1233   LYMPHSABS 1.7 09/21/2020 1233   MONOABS 0.3 09/21/2020 1233   EOSABS 0.1 09/21/2020 1233   BASOSABS 0.0 09/21/2020 1233    DIAGNOSTIC IMAGING:  I have independently reviewed the scans and discussed with the patient. No results found.   ASSESSMENT:  1. Chronic iron deficiency anemia:  -Admitted to the hospital from 01/27/2020 through 01/29/2020 with dizziness and dyspnea on exertion. Found to have hemoglobin 5.6, received blood transfusions. -History of GI bleed in October 2020 [facility] [place], required 5 units of PRBC. -EGD on 01/28/2020 and colonoscopy on 01/29/2019 were nonrevealing. -Capsule study on 03/12/2020, stomach with several images of blood flecks or wisps of blood without obvious source. Multiple images of duodenal, proximal jejunal areas with what appears lymphangiectasia's without active bleeding. Further along in the study, images with possible erosions/ulcers. -Other nutritional deficiency work-up was negative.  SPEP was negative. -Feraheme infusion on 04/17/2020 on 04/24/2020, as well as 06/26/2020 and 07/03/2020  2. Social/family history: -1 and half pack per day current active smoker for 56 years. -2 sisters had breast cancer.   PLAN:  1. Chronic iron deficiency anemia:  -He notes some improvement in his energy levels after last Feraheme infusion. -Labs reviewed from 09/21/2020: Hemoglobin dropped  slightly to 11.4 (was 12.3 in November 2021), with MCV 90.6.  Iron stores remain quite low with ferritin 7 and 13% iron saturation.   -He is continuing to have black stools. -We have recommended 2 more infusions of Feraheme (or alternate source of IV iron, pending insurance approval) -We will initiate trial of oral iron after immediate IV repletion -Follow-up in 3 months with repeat labs including CBC, ferritin and iron panel.  2. Smoking history: -Reviewed results of CT chest from 05/26/2020 which was lung RADS 1. -Continue annual imaging for lung cancer screening as indicated   PLAN SUMMARY: -IV Feraheme x2 doses (or alternate form of IV iron, pending insurance approval) -Patient instructed to begin taking over-the-counter iron supplementation daily, increase to twice daily as tolerated.  Take Colace as needed for iron-related constipation. -RTC in 3 minutes for repeat labs and further treatment if needed.   Doreatha MassedSreedhar Terris Germano, MD [facility] [phone]  I, Rojelio Brennerebekah Pennington PA-C, have seen this patient in junction with Dr. Doreatha MassedSreedhar Noell Lorensen.  Greater than 50% of the visit was performed by Dr. Ellin SabaKatragadda.

## 2020-09-28 NOTE — Patient Instructions (Signed)
[facility] at [facility] Discharge Instructions  You were seen today by Rojelio Brenner PA-C and Dr. Ellin Saba for ongoing management of your iron-deficiency anemia. Your iron deficiency anemia is related to your chronic gastrointestinal blood loss - we recommend that you continue to follow up with your GI doctor to address this bleeding.  For your iron deficiency, we will schedule two IV iron infusions this month. In addition to that, please start taking over-the-counter iron supplementation (ferrous sulfate or ferrous gluconate - 325 mg iron). Start by taking one iron pill per day, and if you can tolerate it without significant side effects (stomach upset and constipation), then start taking iron pill twice daily. Take over the counter stool softener to treat iron-related constipation. Please be aware that you may see darker stools than normal while taking your iron pill.  We will repeat labs and see you again in clinic in 3 months.  Seek immediate medical attention if you have signs/symptoms of significant blood loss, including extreme fatigue, shortness of breath, chest pain, dizziness, and gross hemorrhage.   Thank you for choosing [facility] at [facility] to provide your oncology and hematology care.  To afford each patient quality time with our provider, please arrive at least 15 minutes before your scheduled appointment time.   If you have a lab appointment with the Cancer Center please come in thru the Main Entrance and check in at the main information desk.  You need to re-schedule your appointment should you arrive 10 or more minutes late.  We strive to give you quality time with our providers, and arriving late affects you and other patients whose appointments are after yours.  Also, if you no show three or more times for appointments you may be dismissed from the clinic at the providers discretion.     Again, thank you for choosing  [facility].  Our hope is that these requests will decrease the amount of time that you wait before being seen by our physicians.       _____________________________________________________________  Should you have questions after your visit to [facility], please contact our office at [phone] and follow the prompts.  Our office hours are 8:00 a.m. and 4:30 p.m. Monday - Friday.  Please note that voicemails left after 4:00 p.m. may not be returned until the following business day.  We are closed weekends and major holidays.  You do have access to a nurse 24-7, just call the main number to the clinic [phone] and do not press any options, hold on the line and a nurse will answer the phone.    For prescription refill requests, have your pharmacy contact our office and allow 72 hours.    Due to Covid, you will need to wear a mask upon entering the hospital. If you do not have a mask, a mask will be given to you at the Main Entrance upon arrival. For doctor visits, patients may have 1 support person age 84 or older with them. For treatment visits, patients can not have anyone with them due to social distancing guidelines and our immunocompromised population.

## 2020-09-29 ENCOUNTER — Telehealth: Payer: Self-pay

## 2020-09-29 ENCOUNTER — Other Ambulatory Visit (HOSPITAL_COMMUNITY): Payer: Self-pay | Admitting: Physician Assistant

## 2020-09-29 ENCOUNTER — Ambulatory Visit (HOSPITAL_COMMUNITY)
Admission: RE | Admit: 2020-09-29 | Discharge: 2020-09-29 | Disposition: A | Discharge status: Home / Self Care | Payer: Medicare Other | Source: Ambulatory Visit | Attending: Cardiology | Admitting: Cardiology

## 2020-09-29 DIAGNOSIS — I6523 Occlusion and stenosis of bilateral carotid arteries: Secondary | ICD-10-CM | POA: Insufficient documentation

## 2020-09-29 NOTE — Telephone Encounter (Signed)
[facility] called and is going to fax patient's Kyle Daniel results.

## 2020-09-30 ENCOUNTER — Other Ambulatory Visit: Payer: Self-pay

## 2020-09-30 ENCOUNTER — Telehealth: Payer: Self-pay | Admitting: Cardiology

## 2020-09-30 DIAGNOSIS — I6523 Occlusion and stenosis of bilateral carotid arteries: Secondary | ICD-10-CM

## 2020-09-30 NOTE — Telephone Encounter (Signed)
Orders placed for CTA head and neck. Contacted patient and he was not home at the moment. Left message to call our office back.

## 2020-09-30 NOTE — Telephone Encounter (Signed)
Difficult to visualize severity of blockages in the arteries in the neck, at least moderate. Can we order a CTA head and neck to better evaluate these arteries   Dominga Ferry MD

## 2020-09-30 NOTE — Telephone Encounter (Signed)
Spoke to patient and he is aware that an order has been placed for CTA head and neck. He will be awaiting a call from the scheduler.

## 2020-09-30 NOTE — Telephone Encounter (Signed)
Patient called back to get results. Advised patient the nurses are in with patient's but they would call him back.

## 2020-10-02 ENCOUNTER — Other Ambulatory Visit: Payer: Self-pay

## 2020-10-02 ENCOUNTER — Encounter (HOSPITAL_COMMUNITY): Payer: Self-pay

## 2020-10-02 ENCOUNTER — Inpatient Hospital Stay (HOSPITAL_COMMUNITY): Payer: Medicare Other

## 2020-10-02 VITALS — BP 121/59 | HR 73 | Temp 97.1°F | Resp 20

## 2020-10-02 DIAGNOSIS — D509 Iron deficiency anemia, unspecified: Secondary | ICD-10-CM | POA: Diagnosis not present

## 2020-10-02 DIAGNOSIS — D500 Iron deficiency anemia secondary to blood loss (chronic): Secondary | ICD-10-CM

## 2020-10-02 DIAGNOSIS — K922 Gastrointestinal hemorrhage, unspecified: Secondary | ICD-10-CM

## 2020-10-02 MED ORDER — SODIUM CHLORIDE 0.9 % IV SOLN: Freq: Once | INTRAVENOUS | Status: AC

## 2020-10-02 MED ORDER — SODIUM CHLORIDE 0.9 % IV SOLN
510.0000 mg | Freq: Once | INTRAVENOUS | Status: AC
  Administered 2020-10-02: 510 mg via INTRAVENOUS
  Filled 2020-10-02: qty 510

## 2020-10-02 NOTE — Progress Notes (Signed)
Patient tolerated iron infusion with no complaints voiced.  Peripheral IV site clean and dry with good blood return noted before and after infusion.  Band aid applied.  VSS with discharge and left in satisfactory condition with no s/s of distress noted.   

## 2020-10-09 ENCOUNTER — Encounter (HOSPITAL_COMMUNITY): Payer: Self-pay

## 2020-10-09 ENCOUNTER — Other Ambulatory Visit: Payer: Self-pay

## 2020-10-09 ENCOUNTER — Inpatient Hospital Stay (HOSPITAL_COMMUNITY): Payer: Medicare Other

## 2020-10-09 VITALS — BP 120/51 | HR 74 | Temp 96.8°F | Resp 18

## 2020-10-09 DIAGNOSIS — D500 Iron deficiency anemia secondary to blood loss (chronic): Secondary | ICD-10-CM

## 2020-10-09 DIAGNOSIS — D509 Iron deficiency anemia, unspecified: Secondary | ICD-10-CM | POA: Diagnosis not present

## 2020-10-09 DIAGNOSIS — K922 Gastrointestinal hemorrhage, unspecified: Secondary | ICD-10-CM

## 2020-10-09 MED ORDER — SODIUM CHLORIDE 0.9 % IV SOLN
510.0000 mg | Freq: Once | INTRAVENOUS | Status: AC
  Administered 2020-10-09: 510 mg via INTRAVENOUS
  Filled 2020-10-09: qty 510

## 2020-10-09 MED ORDER — SODIUM CHLORIDE 0.9 % IV SOLN: Freq: Once | INTRAVENOUS | Status: AC

## 2020-10-09 NOTE — Patient Instructions (Signed)
[facility] at [facility]  Discharge Instructions:   _______________________________________________________________  Thank you for choosing [facility] at [facility] to provide your oncology and hematology care.  To afford each patient quality time with our providers, please arrive at least 15 minutes before your scheduled appointment.  You need to re-schedule your appointment if you arrive 10 or more minutes late.  We strive to give you quality time with our providers, and arriving late affects you and other patients whose appointments are after yours.  Also, if you no show three or more times for appointments you may be dismissed from the clinic.  Again, thank you for choosing  Cancer Center at [facility]. Our hope is that these requests will allow you access to exceptional care and in a timely manner. _______________________________________________________________  If you have questions after your visit, please contact our office at [phone] between the hours of 8:30 a.m. and 5:00 p.m. Voicemails left after 4:30 p.m. will not be returned until the following business day. _______________________________________________________________  For prescription refill requests, have your pharmacy contact our office. _______________________________________________________________  Recommendations made by the consultant and any test results will be sent to your referring physician. _______________________________________________________________ 

## 2020-10-09 NOTE — Progress Notes (Signed)
Patient tolerated iron infusion with no complaints voiced.  Peripheral IV site clean and dry with good blood return noted before and after infusion.  Band aid applied.  VSS with discharge and left in satisfactory condition with no s/s of distress noted.   

## 2020-10-28 ENCOUNTER — Encounter (HOSPITAL_COMMUNITY): Payer: Self-pay | Admitting: Radiology

## 2020-10-28 ENCOUNTER — Ambulatory Visit (HOSPITAL_COMMUNITY)
Admission: RE | Admit: 2020-10-28 | Discharge: 2020-10-28 | Disposition: A | Discharge status: Home / Self Care | Payer: Medicare Other | Source: Ambulatory Visit | Attending: Cardiology | Admitting: Cardiology

## 2020-10-28 ENCOUNTER — Other Ambulatory Visit: Payer: Self-pay

## 2020-10-28 DIAGNOSIS — I6523 Occlusion and stenosis of bilateral carotid arteries: Secondary | ICD-10-CM | POA: Insufficient documentation

## 2020-10-28 LAB — POCT I-STAT CREATININE: Creatinine, Ser: 1.5 mg/dL — ABNORMAL HIGH (ref 0.61–1.24)

## 2020-10-28 MED ORDER — IOHEXOL 350 MG/ML SOLN
75.0000 mL | Freq: Once | INTRAVENOUS | Status: AC | PRN
  Administered 2020-10-28: 75 mL via INTRAVENOUS

## 2020-10-30 ENCOUNTER — Telehealth: Payer: Self-pay

## 2020-10-30 ENCOUNTER — Other Ambulatory Visit: Payer: Self-pay

## 2020-10-30 DIAGNOSIS — I6523 Occlusion and stenosis of bilateral carotid arteries: Secondary | ICD-10-CM

## 2020-10-30 NOTE — Telephone Encounter (Signed)
-----   Message from Antoine Poche, MD sent at 10/29/2020 11:24 AM EDT ----- CT scan does show some significant blockages in the carotid arteries, can we refer him to vascular for evaluation for carotid stenosis   Dominga Ferry MD

## 2020-10-30 NOTE — Telephone Encounter (Signed)
Contacted patient who verbalized understanding. Referral to Vascular was sent per Dr. Wyline Mood.

## 2020-10-30 NOTE — Progress Notes (Signed)
Referral to VVS per Dr. Wyline Mood -Evaluation for carotid stenosis.

## 2020-11-16 ENCOUNTER — Encounter: Payer: Self-pay | Admitting: Vascular Surgery

## 2020-11-16 ENCOUNTER — Other Ambulatory Visit: Payer: Self-pay

## 2020-11-16 ENCOUNTER — Ambulatory Visit: Payer: Medicare Other | Admitting: Vascular Surgery

## 2020-11-16 VITALS — BP 117/72 | HR 76 | Temp 97.6°F | Resp 16 | Ht 73.0 in | Wt 267.0 lb

## 2020-11-16 DIAGNOSIS — I6523 Occlusion and stenosis of bilateral carotid arteries: Secondary | ICD-10-CM | POA: Diagnosis not present

## 2020-11-16 NOTE — H&P (View-Only) (Signed)
[facility] [place]  Patient name: Kyle Daniel MRN: [id number] DOB: 1945/11/14 Sex: male  REASON FOR CONSULT: Evaluation severe asymptomatic carotid disease  HPI: Kyle Daniel is a 75 y.o. male, who is here today for discussion of asymptomatic carotid stenosis.  He is here today with his wife.  He has known history of carotid disease which has been followed with serial ultrasounds.  Recently this appeared to have progressed to a critical level and he underwent CTA for further evaluation.  He is here for discussion of this.  He specifically denies any prior episodes of aphasia, amaurosis fugax, TIA or stroke.  He does have a history of coronary disease dating back 20 years.  Underwent coronary artery bypass grafting in 2004.  Reports that he is remained stable since that time.  He is not on any anticoagulation.  Is insulin-dependent diabetic and has hypertension and hyperlipidemia.  He does continue to smoke cigarettes and has no desire to quit.  Past Medical History:  Diagnosis Date  . ASCVD (arteriosclerotic cardiovascular disease)    S/P CABG surgery  in 2004; cardiac cath in 2005 revealed patent grafts.  Marland Kitchen BPH (benign prostatic hypertrophy)   . Carotid artery occlusion   . Cerebrovascular disease    bilateral bruits  . DM (diabetes mellitus) (HCC)    AODM-- insulin requiring for the past 26yrs  . Hx of adenomatous colonic polyps   . Hyperlipidemia   . Hypertension   . Tobacco abuse    50 pack years; current 1 pack per day    Family History  Problem Relation Age of Onset  . Pneumonia Mother 38       complications of diabetes  . Diabetes Mother   . Pneumonia Father 90       complication of pneumonia  . Cancer Sister        breast  . Cancer Sister        breast cancer  . Colon cancer Neg Hx     SOCIAL HISTORY: Social History   Socioeconomic History  . Marital status: Married    Spouse name: valeria  . Number of  children: 3  . Years of education: Not on file  . Highest education level: Not on file  Occupational History  . Occupation: disabled  Tobacco Use  . Smoking status: Current Every Day Smoker    Packs/day: 1.50    Years: 50.00    Pack years: 75.00    Types: Cigarettes    Start date: 09/05/1960  . Smokeless tobacco: Never Used  Vaping Use  . Vaping Use: Never used  Substance and Sexual Activity  . Alcohol use: No    Comment: quit 1980's  . Drug use: No  . Sexual activity: Not on file  Other Topics Concern  . Not on file  Social History Narrative  . Not on file   Social Determinants of Health   Financial Resource Strain: Low Risk   . Difficulty of Paying Living Expenses: Not hard at all  Food Insecurity: No Food Insecurity  . Worried About Programme researcher, broadcasting/film/video in the Last Year: Never true  . Ran Out of Food in the Last Year: Never true  Transportation Needs: No Transportation Needs  . Lack of Transportation (Medical): No  . Lack of Transportation (Non-Medical): No  Physical Activity: Inactive  . Days of Exercise per Week: 0 days  . Minutes of Exercise per Session: 0 min  Stress: No Stress Concern Present  .  Feeling of Stress : Only a little  Social Connections: Socially Integrated  . Frequency of Communication with Friends and Family: More than three times a week  . Frequency of Social Gatherings with Friends and Family: More than three times a week  . Attends Religious Services: 1 to 4 times per year  . Active Member of Clubs or Organizations: Yes  . Attends Banker Meetings: More than 4 times per year  . Marital Status: Married  Catering manager Violence: Not At Risk  . Fear of Current or Ex-Partner: No  . Emotionally Abused: No  . Physically Abused: No  . Sexually Abused: No    Allergies  Allergen Reactions  . Lisinopril Cough and Other (See Comments)    Current Outpatient Medications  Medication Sig Dispense Refill  . albuterol (VENTOLIN HFA)  108 (90 Base) MCG/ACT inhaler Inhale 1-2 puffs into the lungs every 6 (six) hours as needed for wheezing or shortness of breath.     Marland Kitchen amitriptyline (ELAVIL) 25 MG tablet Take 25 mg by mouth at bedtime.    Marland Kitchen amLODipine (NORVASC) 10 MG tablet Take 1 tablet (10 mg total) by mouth daily. 30 tablet 5  . aspirin 81 MG tablet Take 1 tablet (81 mg total) by mouth daily with breakfast. 30 tablet 1  . cholecalciferol (VITAMIN D3) 25 MCG (1000 UNIT) tablet Take 2,000 Units by mouth every morning.     . cyanocobalamin (,VITAMIN B-12,) 1000 MCG/ML injection Inject 1 mL (1,000 mcg total) into the muscle every 30 (thirty) days. 1 mL 5  . dorzolamide-timolol (COSOPT) 22.3-6.8 MG/ML ophthalmic solution Place 1 drop into the right eye 2 (two) times daily.    . finasteride (PROSCAR) 5 MG tablet Take 5 mg by mouth at bedtime.    . fish oil-omega-3 fatty acids 1000 MG capsule Take 3 g by mouth 2 (two) times daily.    Marland Kitchen gabapentin (NEURONTIN) 100 MG capsule Take 200 mg by mouth 2 (two) times daily.     . insulin glargine (LANTUS) 100 UNIT/ML injection Inject 30 Units into the skin in the morning and at bedtime.    . insulin NPH (HUMULIN N,NOVOLIN N) 100 UNIT/ML injection Inject 6 Units into the skin daily as needed (for blood sugar levels over 250).    Marland Kitchen losartan (COZAAR) 25 MG tablet Take 1 tablet (25 mg total) by mouth daily. 90 tablet 2  . nitroGLYCERIN (NITROSTAT) 0.4 MG SL tablet Place 1 tablet (0.4 mg total) under the tongue every 5 (five) minutes as needed for chest pain. 25 tablet 3  . pravastatin (PRAVACHOL) 80 MG tablet Take 1 tablet (80 mg total) by mouth daily. 90 tablet 1  . prazosin (MINIPRESS) 2 MG capsule Take 2 mg by mouth at bedtime.    . torsemide (DEMADEX) 20 MG tablet Take 20 mg by mouth daily.    . traZODone (DESYREL) 50 MG tablet Take 50 mg by mouth at bedtime.     No current facility-administered medications for this visit.    REVIEW OF SYSTEMS:  [X]  denotes positive finding, [ ]  denotes  negative finding Cardiac  Comments:  Chest pain or chest pressure:    Shortness of breath upon exertion: x   Short of breath when lying flat:    Irregular heart rhythm:        Vascular    Pain in calf, thigh, or hip brought on by ambulation: x   Pain in feet at night that wakes you up from your  sleep:     Blood clot in your veins:    Leg swelling:  x       Pulmonary    Oxygen at home:    Productive cough:     Wheezing:         Neurologic    Sudden weakness in arms or legs:     Sudden numbness in arms or legs:     Sudden onset of difficulty speaking or slurred speech:    Temporary loss of vision in one eye:     Problems with dizziness:         Gastrointestinal    Blood in stool:     Vomited blood:         Genitourinary    Burning when urinating:     Blood in urine:        Psychiatric    Major depression:  x       Hematologic    Bleeding problems: x   Problems with blood clotting too easily:        Skin    Rashes or ulcers:        Constitutional    Fever or chills:      PHYSICAL EXAM: Vitals:   11/16/20 0925  BP: 117/72  Pulse: 76  Resp: 16  Temp: 97.6 F (36.4 C)  TempSrc: Other (Comment)  SpO2: 97%  Weight: 267 lb (121.1 kg)  Height: 6\' 1"  (1.854 m)    GENERAL: The patient is a well-nourished male, in no acute distress. The vital signs are documented above. CARDIOVASCULAR: Carotid arteries without bruits bilaterally.  2+ radial pulses bilaterally.  He has had prior left radial artery harvest but does have radial pulse below this from patent palmar arch. PULMONARY: There is good air exchange  MUSCULOSKELETAL: There are no major deformities or cyanosis. NEUROLOGIC: No focal weakness or paresthesias are detected. SKIN: There are no ulcers or rashes noted. PSYCHIATRIC: The patient has a normal affect.  DATA:  I reviewed his CT scan.  This does show critical stenosis in his right internal carotid artery and moderate to severe left internal carotid  artery stenosis.  He does have some calcification of his intracranial vessels but no critical narrowing  MEDICAL ISSUES: Had long discussion with the patient and his wife regarding his asymptomatic disease.  I explained that his left carotid stenosis is below the threshold of where I would recommend elective repair for asymptomatic disease.  I did discuss symptoms of carotid disease and he knows that this would change the recommendation would recommend surgery for this.  He does have critical stenosis in his right internal carotid artery.  This is at the bifurcation with a normal internal carotid above this.  I have recommended right carotid endarterectomy for reduction of stroke risk.  I described the procedure in detail.  Explained that it would be a expected 1 night hospitalization at [facility].  Also discussed the potential for stroke with surgery at 1 to 1-1/2% and also very slight risk of cranial nerve injury.  Patient understands and agrees and wishes to proceed.  We will schedule this at his earliest convenience   [facility], [facility] Tel [phone] Pager [phone]  Note: Portions of this report may have been transcribed using voice recognition software.  Every effort has been made to ensure accuracy; however, inadvertent computerized transcription errors may still be present.

## 2020-11-16 NOTE — Progress Notes (Signed)
[facility] [place]  Patient name: Kyle Daniel MRN: [id number] DOB: 1945/11/14 Sex: male  REASON FOR CONSULT: Evaluation severe asymptomatic carotid disease  HPI: Kyle Daniel is a 75 y.o. male, who is here today for discussion of asymptomatic carotid stenosis.  He is here today with his wife.  He has known history of carotid disease which has been followed with serial ultrasounds.  Recently this appeared to have progressed to a critical level and he underwent CTA for further evaluation.  He is here for discussion of this.  He specifically denies any prior episodes of aphasia, amaurosis fugax, TIA or stroke.  He does have a history of coronary disease dating back 20 years.  Underwent coronary artery bypass grafting in 2004.  Reports that he is remained stable since that time.  He is not on any anticoagulation.  Is insulin-dependent diabetic and has hypertension and hyperlipidemia.  He does continue to smoke cigarettes and has no desire to quit.  Past Medical History:  Diagnosis Date  . ASCVD (arteriosclerotic cardiovascular disease)    S/P CABG surgery  in 2004; cardiac cath in 2005 revealed patent grafts.  Marland Kitchen BPH (benign prostatic hypertrophy)   . Carotid artery occlusion   . Cerebrovascular disease    bilateral bruits  . DM (diabetes mellitus) (HCC)    AODM-- insulin requiring for the past 26yrs  . Hx of adenomatous colonic polyps   . Hyperlipidemia   . Hypertension   . Tobacco abuse    50 pack years; current 1 pack per day    Family History  Problem Relation Age of Onset  . Pneumonia Mother 38       complications of diabetes  . Diabetes Mother   . Pneumonia Father 90       complication of pneumonia  . Cancer Sister        breast  . Cancer Sister        breast cancer  . Colon cancer Neg Hx     SOCIAL HISTORY: Social History   Socioeconomic History  . Marital status: Married    Spouse name: valeria  . Number of  children: 3  . Years of education: Not on file  . Highest education level: Not on file  Occupational History  . Occupation: disabled  Tobacco Use  . Smoking status: Current Every Day Smoker    Packs/day: 1.50    Years: 50.00    Pack years: 75.00    Types: Cigarettes    Start date: 09/05/1960  . Smokeless tobacco: Never Used  Vaping Use  . Vaping Use: Never used  Substance and Sexual Activity  . Alcohol use: No    Comment: quit 1980's  . Drug use: No  . Sexual activity: Not on file  Other Topics Concern  . Not on file  Social History Narrative  . Not on file   Social Determinants of Health   Financial Resource Strain: Low Risk   . Difficulty of Paying Living Expenses: Not hard at all  Food Insecurity: No Food Insecurity  . Worried About Programme researcher, broadcasting/film/video in the Last Year: Never true  . Ran Out of Food in the Last Year: Never true  Transportation Needs: No Transportation Needs  . Lack of Transportation (Medical): No  . Lack of Transportation (Non-Medical): No  Physical Activity: Inactive  . Days of Exercise per Week: 0 days  . Minutes of Exercise per Session: 0 min  Stress: No Stress Concern Present  .  Feeling of Stress : Only a little  Social Connections: Socially Integrated  . Frequency of Communication with Friends and Family: More than three times a week  . Frequency of Social Gatherings with Friends and Family: More than three times a week  . Attends Religious Services: 1 to 4 times per year  . Active Member of Clubs or Organizations: Yes  . Attends Club or Organization Meetings: More than 4 times per year  . Marital Status: Married  Intimate Partner Violence: Not At Risk  . Fear of Current or Ex-Partner: No  . Emotionally Abused: No  . Physically Abused: No  . Sexually Abused: No    Allergies  Allergen Reactions  . Lisinopril Cough and Other (See Comments)    Current Outpatient Medications  Medication Sig Dispense Refill  . albuterol (VENTOLIN HFA)  108 (90 Base) MCG/ACT inhaler Inhale 1-2 puffs into the lungs every 6 (six) hours as needed for wheezing or shortness of breath.     . amitriptyline (ELAVIL) 25 MG tablet Take 25 mg by mouth at bedtime.    . amLODipine (NORVASC) 10 MG tablet Take 1 tablet (10 mg total) by mouth daily. 30 tablet 5  . aspirin 81 MG tablet Take 1 tablet (81 mg total) by mouth daily with breakfast. 30 tablet 1  . cholecalciferol (VITAMIN D3) 25 MCG (1000 UNIT) tablet Take 2,000 Units by mouth every morning.     . cyanocobalamin (,VITAMIN B-12,) 1000 MCG/ML injection Inject 1 mL (1,000 mcg total) into the muscle every 30 (thirty) days. 1 mL 5  . dorzolamide-timolol (COSOPT) 22.3-6.8 MG/ML ophthalmic solution Place 1 drop into the right eye 2 (two) times daily.    . finasteride (PROSCAR) 5 MG tablet Take 5 mg by mouth at bedtime.    . fish oil-omega-3 fatty acids 1000 MG capsule Take 3 g by mouth 2 (two) times daily.    . gabapentin (NEURONTIN) 100 MG capsule Take 200 mg by mouth 2 (two) times daily.     . insulin glargine (LANTUS) 100 UNIT/ML injection Inject 30 Units into the skin in the morning and at bedtime.    . insulin NPH (HUMULIN N,NOVOLIN N) 100 UNIT/ML injection Inject 6 Units into the skin daily as needed (for blood sugar levels over 250).    . losartan (COZAAR) 25 MG tablet Take 1 tablet (25 mg total) by mouth daily. 90 tablet 2  . nitroGLYCERIN (NITROSTAT) 0.4 MG SL tablet Place 1 tablet (0.4 mg total) under the tongue every 5 (five) minutes as needed for chest pain. 25 tablet 3  . pravastatin (PRAVACHOL) 80 MG tablet Take 1 tablet (80 mg total) by mouth daily. 90 tablet 1  . prazosin (MINIPRESS) 2 MG capsule Take 2 mg by mouth at bedtime.    . torsemide (DEMADEX) 20 MG tablet Take 20 mg by mouth daily.    . traZODone (DESYREL) 50 MG tablet Take 50 mg by mouth at bedtime.     No current facility-administered medications for this visit.    REVIEW OF SYSTEMS:  [X] denotes positive finding, [ ] denotes  negative finding Cardiac  Comments:  Chest pain or chest pressure:    Shortness of breath upon exertion: x   Short of breath when lying flat:    Irregular heart rhythm:        Vascular    Pain in calf, thigh, or hip brought on by ambulation: x   Pain in feet at night that wakes you up from your   sleep:     Blood clot in your veins:    Leg swelling:  x       Pulmonary    Oxygen at home:    Productive cough:     Wheezing:         Neurologic    Sudden weakness in arms or legs:     Sudden numbness in arms or legs:     Sudden onset of difficulty speaking or slurred speech:    Temporary loss of vision in one eye:     Problems with dizziness:         Gastrointestinal    Blood in stool:     Vomited blood:         Genitourinary    Burning when urinating:     Blood in urine:        Psychiatric    Major depression:  x       Hematologic    Bleeding problems: x   Problems with blood clotting too easily:        Skin    Rashes or ulcers:        Constitutional    Fever or chills:      PHYSICAL EXAM: Vitals:   11/16/20 0925  BP: 117/72  Pulse: 76  Resp: 16  Temp: 97.6 F (36.4 C)  TempSrc: Other (Comment)  SpO2: 97%  Weight: 267 lb (121.1 kg)  Height: 6\' 1"  (1.854 m)    GENERAL: The patient is a well-nourished male, in no acute distress. The vital signs are documented above. CARDIOVASCULAR: Carotid arteries without bruits bilaterally.  2+ radial pulses bilaterally.  He has had prior left radial artery harvest but does have radial pulse below this from patent palmar arch. PULMONARY: There is good air exchange  MUSCULOSKELETAL: There are no major deformities or cyanosis. NEUROLOGIC: No focal weakness or paresthesias are detected. SKIN: There are no ulcers or rashes noted. PSYCHIATRIC: The patient has a normal affect.  DATA:  I reviewed his CT scan.  This does show critical stenosis in his right internal carotid artery and moderate to severe left internal carotid  artery stenosis.  He does have some calcification of his intracranial vessels but no critical narrowing  MEDICAL ISSUES: Had long discussion with the patient and his wife regarding his asymptomatic disease.  I explained that his left carotid stenosis is below the threshold of where I would recommend elective repair for asymptomatic disease.  I did discuss symptoms of carotid disease and he knows that this would change the recommendation would recommend surgery for this.  He does have critical stenosis in his right internal carotid artery.  This is at the bifurcation with a normal internal carotid above this.  I have recommended right carotid endarterectomy for reduction of stroke risk.  I described the procedure in detail.  Explained that it would be a expected 1 night hospitalization at [facility].  Also discussed the potential for stroke with surgery at 1 to 1-1/2% and also very slight risk of cranial nerve injury.  Patient understands and agrees and wishes to proceed.  We will schedule this at his earliest convenience   [facility] Tel [phone] Pager [phone]  Note: Portions of this report may have been transcribed using voice recognition software.  Every effort has been made to ensure accuracy; however, inadvertent computerized transcription errors may still be present.

## 2020-11-24 ENCOUNTER — Telehealth: Payer: Self-pay | Admitting: Cardiology

## 2020-11-24 NOTE — Telephone Encounter (Signed)
   [facility] Pre-operative Risk Assessment    Patient Name: Kyle Daniel  DOB: 1946-05-31  MRN: [id number]   HEARTCARE STAFF: - Please ensure there is not already an duplicate clearance open for this procedure. - Under Visit Info/Reason for Call, type in Other and utilize the format Clearance MM/DD/YY or Clearance TBD. Do not use dashes or single digits. - If request is for dental extraction, please clarify the # of teeth to be extracted.  Request for surgical clearance:  1. What type of surgery is being performed? Right Carotid Artery Endarterectomy   2. When is this surgery scheduled? TBD  3. What type of clearance is required (medical clearance vs. Pharmacy clearance to hold med vs. Both)? Cardiac Clearance  4. Are there any medications that need to be held prior to surgery and how long? Not indicated  5. Practice name and name of physician performing surgery? [facility] Dr. Donnetta Hutching   6. What is the office phone number? [phone] (Ask for the surgery scheduling nurse).   7.   What is the office fax number? [phone]  8.   Anesthesia type (None, local, MAC, general) ? Not indicated   Alvin Critchley 11/24/2020, 12:37 PM  _________________________________________________________________   (provider comments below)

## 2020-11-26 ENCOUNTER — Other Ambulatory Visit: Payer: Self-pay

## 2020-12-01 NOTE — Progress Notes (Signed)
Surgical Instructions    Your procedure is scheduled on Dec 08, 2020.  Report to [facility] Main Entrance "A" at 08:25 A.M., then check in with the Admitting office.  Call this number if you have problems the morning of surgery:  [phone]   If you have any questions prior to your surgery date call [phone]: Open Monday-Friday 8am-4pm   Remember:  Do not eat or drink after midnight the night before your surgery   Take these medicines the morning of surgery with A SIP OF WATER : Amlodipine (Norvasc) Aspirin Eye drops Gabapentin (Neurontin) Pravastatin (Pravachol)  If needed: Nitroglycerin  As of today, STOP taking any Aleve, Naproxen, Ibuprofen, Motrin, Advil, Goody's, BC's, all herbal medications, fish oil, and all vitamins.   WHAT DO I DO ABOUT MY DIABETES MEDICATION?   Marland Kitchen Do not take oral diabetes medicines (pills) the morning of surgery.  . THE DAY BEFORE SURGERY (May 16), take your NORMAL MORNING DOSE OF LANTUS.  . THE EVENING BEFORE SURGERY (May 16), TAKE a HALF DOSE of LANTUS which is 14 units of insulin.     . THE MORNING OF SURGERY, TAKE a HALF DOSE of LANTUS which is 14 units of insulin.  . If your CBG is greater than 220 mg/dL, you may take  of your sliding scale (correction) dose of insulin on the evening of May 16 and May 17.   HOW TO MANAGE YOUR DIABETES BEFORE AND AFTER SURGERY  Why is it important to control my blood sugar before and after surgery? . Improving blood sugar levels before and after surgery helps healing and can limit problems. . A way of improving blood sugar control is eating a healthy diet by: o  Eating less sugar and carbohydrates o  Increasing activity/exercise o  Talking with your doctor about reaching your blood sugar goals . High blood sugars (greater than 180 mg/dL) can raise your risk of infections and slow your recovery, so you will need to focus on controlling your diabetes during the weeks before surgery. . Make sure  that the doctor who takes care of your diabetes knows about your planned surgery including the date and location.  How do I manage my blood sugar before surgery? . Check your blood sugar at least 4 times a day, starting 2 days before surgery, to make sure that the level is not too high or low. . Check your blood sugar the morning of your surgery when you wake up and every 2 hours until you get to the Short Stay unit. o If your blood sugar is less than 70 mg/dL, you will need to treat for low blood sugar: - Do not take insulin. - Treat a low blood sugar (less than 70 mg/dL) with  cup of clear juice (cranberry or apple), 4 glucose tablets, OR glucose gel. - Recheck blood sugar in 15 minutes after treatment (to make sure it is greater than 70 mg/dL). If your blood sugar is not greater than 70 mg/dL on recheck, call [phone] for further instructions. . Report your blood sugar to the short stay nurse when you get to Short Stay.  . If you are admitted to the hospital after surgery: o Your blood sugar will be checked by the staff and you will probably be given insulin after surgery (instead of oral diabetes medicines) to make sure you have good blood sugar levels. o The goal for blood sugar control after surgery is 80-180 mg/dL.  Do not wear jewelry.            Do not wear lotions, powders, perfumes/colognes, or deodorant.            Do not shave 48 hours prior to surgery.  Men may shave face and neck.            Do not bring valuables to the hospital.            [facility] is not responsible for any belongings or valuables.  Do NOT Smoke (Tobacco/Vaping) or drink Alcohol 24 hours prior to your procedure If you use a CPAP at night, you may bring all equipment for your overnight stay.   Contacts, glasses, dentures or bridgework may not be worn into surgery, please bring cases for these belongings   For patients admitted to the hospital, discharge time will be determined by  your treatment team.   Patients discharged the day of surgery will not be allowed to drive home, and someone needs to stay with them for 24 hours.    Special instructions:   [place]- Preparing For Surgery  Before surgery, you can play an important role. Because skin is not sterile, your skin needs to be as free of germs as possible. You can reduce the number of germs on your skin by washing with CHG (chlorahexidine gluconate) Soap before surgery.  CHG is an antiseptic cleaner which kills germs and bonds with the skin to continue killing germs even after washing.    Oral Hygiene is also important to reduce your risk of infection.  Remember - BRUSH YOUR TEETH THE MORNING OF SURGERY WITH YOUR REGULAR TOOTHPASTE  Please do not use if you have an allergy to CHG or antibacterial soaps. If your skin becomes reddened/irritated stop using the CHG.  Do not shave (including legs and underarms) for at least 48 hours prior to first CHG shower. It is OK to shave your face.  Please follow these instructions carefully.   1. Shower the NIGHT BEFORE SURGERY and the MORNING OF SURGERY  2. If you chose to wash your hair, wash your hair first as usual with your normal shampoo.  3. After you shampoo, rinse your hair and body thoroughly to remove the shampoo.  4. Wash Face and genitals (private parts) with your normal soap.   5.  Shower the NIGHT BEFORE SURGERY and the MORNING OF SURGERY with CHG Soap.   6. Use CHG Soap as you would any other liquid soap. You can apply CHG directly to the skin and wash gently with a scrungie or a clean washcloth.   7. Apply the CHG Soap to your body ONLY FROM THE NECK DOWN.  Do not use on open wounds or open sores. Avoid contact with your eyes, ears, mouth and genitals (private parts). Wash Face and genitals (private parts)  with your normal soap.   8. Wash thoroughly, paying special attention to the area where your surgery will be performed.  9. Thoroughly rinse your  body with warm water from the neck down.  10. DO NOT shower/wash with your normal soap after using and rinsing off the CHG Soap.  11. Pat yourself dry with a CLEAN TOWEL.  12. Wear CLEAN PAJAMAS to bed the night before surgery  13. Place CLEAN SHEETS on your bed the night before your surgery  14. DO NOT SLEEP WITH PETS.   Day of Surgery: Take a shower with CHG soap. Wear Clean/Comfortable clothing the morning of surgery  Do not apply any deodorants/lotions.   Remember to brush your teeth WITH YOUR REGULAR TOOTHPASTE.   Please read over the following fact sheets that you were given.

## 2020-12-02 ENCOUNTER — Other Ambulatory Visit: Payer: Self-pay

## 2020-12-02 ENCOUNTER — Other Ambulatory Visit: Payer: Self-pay | Admitting: *Deleted

## 2020-12-02 ENCOUNTER — Encounter (HOSPITAL_COMMUNITY): Payer: Self-pay

## 2020-12-02 ENCOUNTER — Encounter (HOSPITAL_COMMUNITY)
Admission: RE | Admit: 2020-12-02 | Discharge: 2020-12-02 | Disposition: A | Discharge status: Home / Self Care | Payer: Medicare Other | Source: Ambulatory Visit | Attending: Vascular Surgery | Admitting: Vascular Surgery

## 2020-12-02 DIAGNOSIS — Z01812 Encounter for preprocedural laboratory examination: Secondary | ICD-10-CM | POA: Insufficient documentation

## 2020-12-02 HISTORY — DX: Anemia, unspecified: D64.9

## 2020-12-02 HISTORY — DX: Atherosclerotic heart disease of native coronary artery without angina pectoris: I25.10

## 2020-12-02 LAB — HEMOGLOBIN A1C
Hgb A1c MFr Bld: 5.9 % — ABNORMAL HIGH (ref 4.8–5.6)
Mean Plasma Glucose: 122.63 mg/dL

## 2020-12-02 LAB — PROTIME-INR
INR: 1 (ref 0.8–1.2)
Prothrombin Time: 12.7 seconds (ref 11.4–15.2)

## 2020-12-02 LAB — COMPREHENSIVE METABOLIC PANEL
ALT: 19 U/L (ref 0–44)
AST: 16 U/L (ref 15–41)
Albumin: 3.5 g/dL (ref 3.5–5.0)
Alkaline Phosphatase: 41 U/L (ref 38–126)
Anion gap: 6 (ref 5–15)
BUN: 10 mg/dL (ref 8–23)
CO2: 26 mmol/L (ref 22–32)
Calcium: 8.7 mg/dL — ABNORMAL LOW (ref 8.9–10.3)
Chloride: 107 mmol/L (ref 98–111)
Creatinine, Ser: 1.38 mg/dL — ABNORMAL HIGH (ref 0.61–1.24)
GFR, Estimated: 54 mL/min — ABNORMAL LOW (ref >60)
Glucose, Bld: 110 mg/dL — ABNORMAL HIGH (ref 70–99)
Potassium: 3.5 mmol/L (ref 3.5–5.1)
Sodium: 139 mmol/L (ref 135–145)
Total Bilirubin: 0.1 mg/dL — ABNORMAL LOW (ref 0.3–1.2)
Total Protein: 6.8 g/dL (ref 6.5–8.1)

## 2020-12-02 LAB — URINALYSIS, ROUTINE W REFLEX MICROSCOPIC
Bilirubin Urine: NEGATIVE
Glucose, UA: NEGATIVE mg/dL
Ketones, ur: NEGATIVE mg/dL
Leukocytes,Ua: NEGATIVE
Nitrite: NEGATIVE
Protein, ur: NEGATIVE mg/dL
Specific Gravity, Urine: 1.008 (ref 1.005–1.030)
pH: 5 (ref 5.0–8.0)

## 2020-12-02 LAB — CBC
HCT: 36.4 % — ABNORMAL LOW (ref 39.0–52.0)
Hemoglobin: 11.8 g/dL — ABNORMAL LOW (ref 13.0–17.0)
MCH: 28.9 pg (ref 26.0–34.0)
MCHC: 32.4 g/dL (ref 30.0–36.0)
MCV: 89 fL (ref 80.0–100.0)
Platelets: 219 10*3/uL (ref 150–400)
RBC: 4.09 MIL/uL — ABNORMAL LOW (ref 4.22–5.81)
RDW: 14.6 % (ref 11.5–15.5)
WBC: 4.3 10*3/uL (ref 4.0–10.5)
nRBC: 0 % (ref 0.0–0.2)

## 2020-12-02 LAB — SURGICAL PCR SCREEN
MRSA, PCR: NEGATIVE
Staphylococcus aureus: NEGATIVE

## 2020-12-02 LAB — TYPE AND SCREEN
ABO/RH(D): A POS
Antibody Screen: NEGATIVE

## 2020-12-02 LAB — APTT: aPTT: 35 seconds (ref 24–36)

## 2020-12-02 LAB — GLUCOSE, CAPILLARY: Glucose-Capillary: 189 mg/dL — ABNORMAL HIGH (ref 70–99)

## 2020-12-02 NOTE — Telephone Encounter (Signed)
   Name: Kyle Daniel  DOB: [birth year]  MRN: [id number]   Primary Cardiologist: Dina Rich, MD  Chart reviewed as part of pre-operative protocol coverage.   Left a voicemail for patient to call back for ongoing preop assessment.    Beatriz Stallion, PA-C 12/02/2020, 9:01 AM

## 2020-12-02 NOTE — Progress Notes (Addendum)
PCP - Pearson Grippe, MD Cardiologist - Dina Rich, MD Hematologist - Doreatha Massed, MD  PPM/ICD - Denies  Chest x-ray - N/A EKG - 01/28/2020 Stress Test - 05/31/2019 ECHO - 01/28/2020 Cardiac Cath - 04/14/2004  Sleep Study - Denies, Score 6 for OSA assessment. Note sent to PCP on PAT. CPAP - N/A  Fasting Blood Sugar - 60's-70's.  Per pt is normal for him, does not endorse any symptoms of hypoglycemia. Checks Blood Sugar ___2__ times a day CBG on PAT- 189  Blood Thinner Instructions: N/A Aspirin Instructions: Continue until DOS  ERAS Protcol -No PRE-SURGERY Ensure or G2- N/A  COVID TEST- Scheduled for [facility]/13 at [facility].  Quarantine instructions given.     Anesthesia review: Yes, cardiac hx  Patient denies shortness of breath, fever, cough and chest pain at PAT appointment   All instructions explained to the patient, with a verbal understanding of the material. Patient agrees to go over the instructions while at home for a better understanding. Patient also instructed to self quarantine after being tested for COVID-19. The opportunity to ask questions was provided.

## 2020-12-02 NOTE — Progress Notes (Signed)
   12/02/20 1130  OBSTRUCTIVE SLEEP APNEA  Have you ever been diagnosed with sleep apnea through a sleep study? No  Do you snore loudly (loud enough to be heard through closed doors)?  0  Do you often feel tired, fatigued, or sleepy during the daytime (such as falling asleep during driving or talking to someone)? 0  Has anyone observed you stop breathing during your sleep? 1  Do you have, or are you being treated for high blood pressure? 1  BMI more than 35 kg/m2? 1  Age > 50 (1-yes) 1  Neck circumference greater than:Male 16 inches or larger, Male 17inches or larger? 1  Male Gender (Yes=1) 1  Obstructive Sleep Apnea Score 6

## 2020-12-03 NOTE — Telephone Encounter (Signed)
    Kyle Daniel DOB:  12-04-1945  MRN:  [id number]   Primary Cardiologist: Dina Rich, MD  Chart reviewed as part of pre-operative protocol coverage.   Called, no answer and unable to leave VM.   Alver Sorrow, NP 12/03/2020, 10:59 AM

## 2020-12-03 NOTE — Anesthesia Preprocedure Evaluation (Addendum)
Anesthesia Evaluation  Patient identified by MRN, date of birth, ID band Patient awake    Airway Mallampati: II  TM Distance: >3 FB Neck ROM: Full    Dental  (+) Teeth Intact   Pulmonary COPD,  COPD inhaler, Current Smoker and Patient abstained from smoking.,    Pulmonary exam normal        Cardiovascular hypertension, Pt. on medications + CAD and + CABG (2004)   Rhythm:Regular Rate:Normal     Neuro/Psych Carotid stenosis    GI/Hepatic Neg liver ROS, GERD  ,  Endo/Other  diabetes, Type 2, Insulin Dependent  Renal/GU negative Renal ROS  negative genitourinary   Musculoskeletal  (+) Arthritis ,   Abdominal (+)  Abdomen: soft. Bowel sounds: normal.  Peds  Hematology  (+) anemia ,   Anesthesia Other Findings CTA Head/Neck 10/28/20: IMPRESSION: CTA Neck: 1. Approximately 70-80% right and 70% left ICA origin stenosis secondary to predominately calcific atherosclerosis. 2. Approximately 50% stenosis of the right common carotid artery origin. 3. Multifocal severe stenosis of the small/non dominant right vertebral artery. 4. Severe stenosis of the dominant left vertebral artery at its origin and proximal intradural segment and moderate stenosis at the C3-C4 level. CTA Head: 1. Severe stenosis of an A2 ACA branch (likely the right) with diminutive distal opacification. 2. Moderate bilateral paraclinoid ICA stenosis. 3. Moderate mid basilar artery stenosis and multifocal mild bilateral PCA stenosis. 4. Moderate proximal right M2 MCA stenosis. 5. Small vertebrobasilar system with bilateral fetal type PCAs, anatomic variant.   Reproductive/Obstetrics                            Anesthesia Physical Anesthesia Plan  ASA: III  Anesthesia Plan: General   Post-op Pain Management:    Induction: Intravenous  PONV Risk Score and Plan: 1 and Ondansetron, Dexamethasone and Treatment may vary  due to age or medical condition  Airway Management Planned: Mask and Oral ETT  Additional Equipment: Arterial line  Intra-op Plan:   Post-operative Plan: Extubation in OR  Informed Consent: I have reviewed the patients History and Physical, chart, labs and discussed the procedure including the risks, benefits and alternatives for the proposed anesthesia with the patient or authorized representative who has indicated his/her understanding and acceptance.     Dental advisory given  Plan Discussed with: CRNA  Anesthesia Plan Comments: (PAT note written 12/03/2020 by Shonna Chock, PA-C. Lab Results      Component                Value               Date                      WBC                      4.3                 12/02/2020                HGB                      11.8 (L)            12/02/2020                HCT  36.4 (L)            12/02/2020                MCV                      89.0                12/02/2020                PLT                      219                 12/02/2020           Lab Results      Component                Value               Date                      NA                       139                 12/02/2020                K                        3.5                 12/02/2020                CO2                      26                  12/02/2020                GLUCOSE                  110 (H)             12/02/2020                BUN                      10                  12/02/2020                CREATININE               1.38 (H)            12/02/2020                CALCIUM                  8.7 (L)             12/02/2020                GFRNONAA                 54 (L)              12/02/2020  GFRAA                    >60                 01/29/2020           Echo 01/28/20: IMPRESSIONS  1. Left ventricular ejection fraction, by estimation, is 60 to 65%. The  left ventricle has normal function. The left ventricle  has no regional  wall motion abnormalities. There is moderate left ventricular hypertrophy.  Left ventricular diastolic  parameters were normal.  2. Right ventricular systolic function is normal. The right ventricular  size is normal. There is normal pulmonary artery systolic pressure. The  estimated right ventricular systolic pressure is 35.7 mmHg.  3. Left atrial size was mild to moderately dilated.  4. Right atrial size was upper normal.  5. The mitral valve is grossly normal. There is an area of severe nodular  annular calcification between the mitral and aortic valves without obvious  impact on LVOT flow. Trivial mitral valve regurgitation.  6. The aortic valve is tricuspid. The left coronary cusp is restricted,  but there is no stenosis. Aortic valve regurgitation is not visualized.  Aortic valve mean gradient measures 7.0 mmHg.  7. The inferior vena cava is dilated in size with >50% respiratory  variability, suggesting right atrial pressure of 8 mmHg. )       Anesthesia Quick Evaluation

## 2020-12-03 NOTE — Progress Notes (Addendum)
Anesthesia Chart Review:  Case: [id number] Date/Time: 12/08/20 1012   Procedure: RIGHT CAROTID ENDARTERECTOMY (Right )   Anesthesia type: General   Pre-op diagnosis: BILATERAL CAROTID STENOSIS   Location: [facility] ROOM 12 / [facility]   Surgeons: Larina Earthly, MD      DISCUSSION: Patient is a 75 year old male scheduled for the above procedure. He had approximately 70-80% right and 70% left ICA origin stenoses on recent CTA, asymptomatic disease. He was referred to Dr. Arbie Cookey by cardiologist Dr. Wyline Mood.  History includes smoking, CAD (s/p CABG: LIMA-LAD, left RA-Ramus INT, SVG-CX marginal 12/18/02), HTN, HLD, IDDM, carotid occlusive disease, anemia (admission GI bleed 04/2019, 01/2020 with possible erosions/ulcerations on capsule study), BPH. BMI is consistent with obesity. OSA screening score 6.  Last cardiology visit 09/23/20 with Dr. Wyline Mood.  No recent chest pain.  Denied shortness of breath.  Compliant with medications.  Following with GI and hematology for anemia.  Repeat carotid ultrasound recommended for follow-up carotid artery stenosis. This lead to CTA of the neck and referral to vascular surgery for carotid stenosis.   Per [facility], patient to continue ASA.  Preoperative COVID-19 test is scheduled for 12/04/20.  Anesthesia team to evaluate on the day of surgery. (UPDATE 12/07/20 9:41 AM: Preoperative cardiology input by Gillian Shields, NP reviewed. Patient for "acceptable risk" with no indication for repeat ischemic evaluation as of 12/04/20. COVID-19 screening test negative on 12/04/20.)   VS: BP 125/62   Pulse 91   Temp 36.5 C (Oral)   Resp 18   Ht 6\' 1"  (1.854 m)   Wt 121.1 kg   SpO2 99%   BMI 35.23 kg/m    PROVIDERS: , MD is PCP  Pearson Grippe, MD is cardiologist  Dina Rich, MD is GI Eula Listen, MD is hematology. Last visit 09/28/20 for iron deficiency anemia requiring intermittent IV Feraheme infusions.    LABS: Labs reviewed: Acceptable for surgery. (all labs  ordered are listed, but only abnormal results are displayed)  Labs Reviewed  GLUCOSE, CAPILLARY - Abnormal; Notable for the following components:      Result Value   Glucose-Capillary 189 (*)    All other components within normal limits  CBC - Abnormal; Notable for the following components:   RBC 4.09 (*)    Hemoglobin 11.8 (*)    HCT 36.4 (*)    All other components within normal limits  COMPREHENSIVE METABOLIC PANEL - Abnormal; Notable for the following components:   Glucose, Bld 110 (*)    Creatinine, Ser 1.38 (*)    Calcium 8.7 (*)    Total Bilirubin 0.1 (*)    GFR, Estimated 54 (*)    All other components within normal limits  URINALYSIS, ROUTINE W REFLEX MICROSCOPIC - Abnormal; Notable for the following components:   Hgb urine dipstick SMALL (*)    Bacteria, UA RARE (*)    All other components within normal limits  HEMOGLOBIN A1C - Abnormal; Notable for the following components:   Hgb A1c MFr Bld 5.9 (*)    All other components within normal limits  SURGICAL PCR SCREEN  PROTIME-INR  APTT  TYPE AND SCREEN     IMAGES: CTA Head/Neck 10/28/20: IMPRESSION: CTA Neck: 1. Approximately 70-80% right and 70% left ICA origin stenosis secondary to predominately calcific atherosclerosis. 2. Approximately 50% stenosis of the right common carotid artery origin. 3. Multifocal severe stenosis of the small/non dominant right vertebral artery. 4. Severe stenosis of the dominant left vertebral artery at its origin and  proximal intradural segment and moderate stenosis at the C3-C4 level. CTA Head: 1. Severe stenosis of an A2 ACA branch (likely the right) with diminutive distal opacification. 2. Moderate bilateral paraclinoid ICA stenosis. 3. Moderate mid basilar artery stenosis and multifocal mild bilateral PCA stenosis. 4. Moderate proximal right M2 MCA stenosis. 5. Small vertebrobasilar system with bilateral fetal type PCAs, anatomic variant.  CT Chest lung cancer screen  05/26/20: IMPRESSION: 1. Lung-RADS 1, negative. Continue annual screening with low-dose chest CT without contrast in 12 months. 2. Aortic Atherosclerosis (ICD10-I70.0) and Emphysema (ICD10-J43.9).   EKG: 01/27/20: Normal sinus rhythm Nonspecific ST and T wave abnormality Abnormal ECG No significant change since last tracing Confirmed by Jacalyn Lefevre [phone]) on 01/27/2020 2:04:25 PM   CV: US Carotid 09/29/20: IMPRESSION: At least 50-69% stenosis of the internal carotid arteries bilaterally. There is long segment shadowing plaque within the distal common and proximal internal carotid arteries bilaterally which may be obscuring regions of higher velocity/stenosis. Further evaluation with CT angiography of the neck should be considered to more definitively characterize degree of stenosis.   Echo 01/28/20: IMPRESSIONS  1. Left ventricular ejection fraction, by estimation, is 60 to 65%. The  left ventricle has normal function. The left ventricle has no regional  wall motion abnormalities. There is moderate left ventricular hypertrophy.  Left ventricular diastolic  parameters were normal.  2. Right ventricular systolic function is normal. The right ventricular  size is normal. There is normal pulmonary artery systolic pressure. The  estimated right ventricular systolic pressure is 35.7 mmHg.  3. Left atrial size was mild to moderately dilated.  4. Right atrial size was upper normal.  5. The mitral valve is grossly normal. There is an area of severe nodular  annular calcification between the mitral and aortic valves without obvious  impact on LVOT flow. Trivial mitral valve regurgitation.  6. The aortic valve is tricuspid. The left coronary cusp is restricted,  but there is no stenosis. Aortic valve regurgitation is not visualized.  Aortic valve mean gradient measures 7.0 mmHg.  7. The inferior vena cava is dilated in size with >50% respiratory  variability, suggesting right  atrial pressure of 8 mmHg.   Nuclear stress test 05/31/19: Impression: Myocardial perfusion is abnormal.   This is an intermediate risk study.  Overall left ventricular systolic function was normal.    Nuclear stress EF:  52%.   - Results reviewed by Dr. Wyline Mood on 06/03/19 and wrote, "Stress test shows just 2 small abnormal areas that may represent small areas of blockage. The areas are small enough where its not conisdered to be of significant risk, particularly in the absence of symptoms as reported in Dr Ks note. Management would continue to be medical therapy, if any new onset of symptoms would need to let us know..."   Cardiac cath 04/14/04:  1.  EF greater than 65% without regional wall motion abnormality.  No mitral      regurgitation.  2.  Left main:  60% stenosis of the mid vessel.  3.  LAD:  Moderate size vessel giving rise to a single diagonal branch.      There is a 50% stenosis of the proximal vessel.  The LIMA to LAD is      widely patent with excellent distal runoff.  The left subclavian is      without stenosis.  4.  Ramus intermedius:  70% proximal stenosis.  Widely patent free radial      graft with excellent distal runoff.  5.  Circumflex:  Moderate size vessel giving rise to a single obtuse      marginal.  There is a 60% stenosis in the proximal vessel.  The      saphenous vein graft to the marginal is widely patent with excellent      distal runoff.  6.  RCA:  Relatively small though dominant vessel.  Minor luminal      irregularities along its course.  IMPRESSION/RECOMMENDATIONS:  Widely patent bypass grafts with excellent  distal runoff.  I suspect a noncardiac etiology to his chest discomfort.  Will continue medical therapy.   Past Medical History:  Diagnosis Date  . Anemia   . ASCVD (arteriosclerotic cardiovascular disease)    S/P CABG surgery  in 2004; cardiac cath in 2005 revealed patent grafts.  Marland Kitchen BPH (benign prostatic hypertrophy)   . Carotid artery  occlusion   . Cerebrovascular disease    bilateral bruits  . Coronary artery disease   . DM (diabetes mellitus) (HCC)    AODM-- insulin requiring for the past 24yrs  . Hx of adenomatous colonic polyps   . Hyperlipidemia   . Hypertension   . Pneumonia 2020  . Tobacco abuse    50 pack years; current 1 pack per day    Past Surgical History:  Procedure Laterality Date  . BIOPSY  01/28/2020   Procedure: BIOPSY;  Surgeon: Corbin Ade, MD;  Location: [facility];  Service: Endoscopy;;  . CERVICAL DISCECTOMY  1996   anterior cervical spine discectomy and fusion  . CHOLECYSTECTOMY  06/17/2003   by Lebron Conners MD  . COLONOSCOPY W/ POLYPECTOMY     4 adenomatous polyps  . COLONOSCOPY WITH PROPOFOL N/A 01/29/2020   normal colon, normal appearing TI.   Marland Kitchen CORONARY ARTERY BYPASS GRAFT    . ESOPHAGOGASTRODUODENOSCOPY  07/04/2007   Dr. Darrick Penna; normal esophagus, erythema/edema in gastric antrum s/p biopsied, duodenitis.  Pathology positive for H. pylori.  . ESOPHAGOGASTRODUODENOSCOPY (EGD) WITH PROPOFOL N/A 01/28/2020    mild erosive reflux esophagitis, s/p biopsy of gastric mucosa, normal duodenum overall except for minimally eroded second portion of mucosa s/p biopsy. Pathology: slight chronic gastritis, negative H.pylori, benign duodenal mucosa, no sprue.   Marland Kitchen GIVENS CAPSULE STUDY N/A 02/26/2020   Procedure: GIVENS CAPSULE STUDY;  Surgeon: Lanelle Bal, DO;  Location: [facility];  Service: Endoscopy;  Laterality: N/A;  7:30am  . GIVENS CAPSULE STUDY N/A 03/12/2020   Procedure: GIVENS CAPSULE STUDY;  Surgeon: Corbin Ade, MD;  Location: [facility];  Service: Endoscopy;  Laterality: N/A;  7:30am, pt to arrive early to receive meds 30 mins prior to capsule    MEDICATIONS: . albuterol (VENTOLIN HFA) 108 (90 Base) MCG/ACT inhaler  . amitriptyline (ELAVIL) 25 MG tablet  . amLODipine (NORVASC) 10 MG tablet  . aspirin 81 MG tablet  . cholecalciferol (VITAMIN D3) 25 MCG (1000 UNIT)  tablet  . cyanocobalamin (,VITAMIN B-12,) 1000 MCG/ML injection  . dorzolamide-timolol (COSOPT) 22.3-6.8 MG/ML ophthalmic solution  . ferrous sulfate 325 (65 FE) MG tablet  . finasteride (PROSCAR) 5 MG tablet  . fish oil-omega-3 fatty acids 1000 MG capsule  . gabapentin (NEURONTIN) 100 MG capsule  . insulin glargine (LANTUS) 100 UNIT/ML injection  . insulin NPH (HUMULIN N,NOVOLIN N) 100 UNIT/ML injection  . losartan (COZAAR) 25 MG tablet  . nitroGLYCERIN (NITROSTAT) 0.4 MG SL tablet  . pravastatin (PRAVACHOL) 80 MG tablet  . prazosin (MINIPRESS) 2 MG capsule  . torsemide (DEMADEX) 20 MG  tablet  . traZODone (DESYREL) 50 MG tablet  . vitamin B-12 (CYANOCOBALAMIN) 500 MCG tablet   No current facility-administered medications for this encounter.    Shonna ChockAllison Roldan Laforest, PA-C Surgical Short Stay/[facility]esthesiology [facility] Phone [phone] [facility] Phone [phone] 12/03/2020 1:27 PM

## 2020-12-04 ENCOUNTER — Other Ambulatory Visit
Admission: RE | Admit: 2020-12-04 | Discharge: 2020-12-04 | Disposition: A | Discharge status: Home / Self Care | Payer: Medicare Other | Source: Ambulatory Visit | Attending: Vascular Surgery | Admitting: Vascular Surgery

## 2020-12-04 ENCOUNTER — Other Ambulatory Visit: Payer: Self-pay

## 2020-12-04 DIAGNOSIS — Z01812 Encounter for preprocedural laboratory examination: Secondary | ICD-10-CM | POA: Insufficient documentation

## 2020-12-04 DIAGNOSIS — Z20822 Contact with and (suspected) exposure to covid-19: Secondary | ICD-10-CM | POA: Diagnosis not present

## 2020-12-04 LAB — SARS CORONAVIRUS 2 (TAT 6-24 HRS): SARS Coronavirus 2: NEGATIVE

## 2020-12-04 NOTE — Telephone Encounter (Addendum)
   Name: Kyle Daniel  DOB: Jan 04, 1946  MRN: [id number]   Primary Cardiologist: Dina Rich, MD  Chart reviewed as part of pre-operative protocol coverage. Patient was contacted 12/04/2020 in reference to pre-operative risk assessment for pending surgery as outlined below.  Kyle Daniel was last seen on 09/23/20 by Dr. Wyline Mood. He was doing well from cardiac perspective and recommended for 6 month follow up. Since that day, Kyle Daniel has done overall well. He was evaluated for carotid artery stenosis with duplex and subsequent CT. CT did show critical stenosis in right ICA and moderate to severe left ICA stenosis.  Plan is for right carotid endarterectomy with Dr. Arbie Cookey 12/08/20.  Due to history of CABG ideally would remain on Aspirin throughout the periprocedural period if felt safe from surgical perspective.   Today he tells me he walks regularly for exercise. Intermittently over the last few months he will get a minor pain with movement that self resolves after a few seconds. This is non limiting and overall not bothersome. As it is fleeting and self resolves, low suspicion for angina. No indication for repeat ischemic evaluation at this time.   Therefore, based on ACC/AHA guidelines, the patient would be at acceptable risk for the planned procedure without further cardiovascular testing.   The patient was advised that if he develops new symptoms prior to surgery to contact our office to arrange for a follow-up visit, and he verbalized understanding.  I will route this recommendation to the requesting party via Epic fax function and remove from pre-op pool. Please call with questions.  Alver Sorrow, NP 12/04/2020, 1:42 PM

## 2020-12-07 MED ORDER — CEFAZOLIN IN SODIUM CHLORIDE 3-0.9 GM/100ML-% IV SOLN
3.0000 g | INTRAVENOUS | Status: AC
  Administered 2020-12-08: 3 g via INTRAVENOUS
  Filled 2020-12-07: qty 100

## 2020-12-08 ENCOUNTER — Encounter (HOSPITAL_COMMUNITY)
Admission: RE | Disposition: A | Discharge status: Home / Self Care | Payer: Self-pay | Source: Home / Self Care | Attending: Vascular Surgery

## 2020-12-08 ENCOUNTER — Other Ambulatory Visit: Payer: Self-pay

## 2020-12-08 ENCOUNTER — Encounter (HOSPITAL_COMMUNITY): Payer: Self-pay | Admitting: Vascular Surgery

## 2020-12-08 ENCOUNTER — Inpatient Hospital Stay (HOSPITAL_COMMUNITY): Payer: Medicare Other | Admitting: Certified Registered"

## 2020-12-08 ENCOUNTER — Inpatient Hospital Stay (HOSPITAL_COMMUNITY)
Admission: RE | Admit: 2020-12-08 | Discharge: 2020-12-09 | DRG: 039 | Disposition: A | Discharge status: Home / Self Care | Payer: Medicare Other | Attending: Vascular Surgery | Admitting: Vascular Surgery

## 2020-12-08 ENCOUNTER — Inpatient Hospital Stay (HOSPITAL_COMMUNITY): Payer: Medicare Other | Admitting: Vascular Surgery

## 2020-12-08 DIAGNOSIS — Z8701 Personal history of pneumonia (recurrent): Secondary | ICD-10-CM

## 2020-12-08 DIAGNOSIS — Z7982 Long term (current) use of aspirin: Secondary | ICD-10-CM | POA: Diagnosis not present

## 2020-12-08 DIAGNOSIS — F17210 Nicotine dependence, cigarettes, uncomplicated: Secondary | ICD-10-CM | POA: Diagnosis present

## 2020-12-08 DIAGNOSIS — Z794 Long term (current) use of insulin: Secondary | ICD-10-CM

## 2020-12-08 DIAGNOSIS — N400 Enlarged prostate without lower urinary tract symptoms: Secondary | ICD-10-CM | POA: Diagnosis present

## 2020-12-08 DIAGNOSIS — Z888 Allergy status to other drugs, medicaments and biological substances status: Secondary | ICD-10-CM

## 2020-12-08 DIAGNOSIS — I6521 Occlusion and stenosis of right carotid artery: Principal | ICD-10-CM | POA: Diagnosis present

## 2020-12-08 DIAGNOSIS — I2510 Atherosclerotic heart disease of native coronary artery without angina pectoris: Secondary | ICD-10-CM | POA: Diagnosis present

## 2020-12-08 DIAGNOSIS — Z79899 Other long term (current) drug therapy: Secondary | ICD-10-CM | POA: Diagnosis not present

## 2020-12-08 DIAGNOSIS — Z951 Presence of aortocoronary bypass graft: Secondary | ICD-10-CM | POA: Diagnosis not present

## 2020-12-08 DIAGNOSIS — E785 Hyperlipidemia, unspecified: Secondary | ICD-10-CM | POA: Diagnosis present

## 2020-12-08 DIAGNOSIS — Z833 Family history of diabetes mellitus: Secondary | ICD-10-CM

## 2020-12-08 DIAGNOSIS — I10 Essential (primary) hypertension: Secondary | ICD-10-CM | POA: Diagnosis present

## 2020-12-08 DIAGNOSIS — E119 Type 2 diabetes mellitus without complications: Secondary | ICD-10-CM | POA: Diagnosis present

## 2020-12-08 HISTORY — PX: ENDARTERECTOMY: SHX5162

## 2020-12-08 HISTORY — PX: PATCH ANGIOPLASTY: SHX6230

## 2020-12-08 LAB — GLUCOSE, CAPILLARY
Glucose-Capillary: 120 mg/dL — ABNORMAL HIGH (ref 70–99)
Glucose-Capillary: 102 mg/dL — ABNORMAL HIGH (ref 70–99)
Glucose-Capillary: 195 mg/dL — ABNORMAL HIGH (ref 70–99)

## 2020-12-08 LAB — POCT ACTIVATED CLOTTING TIME: Activated Clotting Time: 315 seconds

## 2020-12-08 SURGERY — ENDARTERECTOMY, CAROTID
Anesthesia: General | Site: Neck | Laterality: Right

## 2020-12-08 MED ORDER — HEPARIN SODIUM (PORCINE) 1000 UNIT/ML IJ SOLN
INTRAMUSCULAR | Status: AC
  Filled 2020-12-08: qty 1

## 2020-12-08 MED ORDER — ACETAMINOPHEN 650 MG RE SUPP: 325.0000 mg | RECTAL | Status: DC | PRN

## 2020-12-08 MED ORDER — GABAPENTIN 100 MG PO CAPS
200.0000 mg | ORAL_CAPSULE | Freq: Two times a day (BID) | ORAL | Status: DC
  Administered 2020-12-08 – 2020-12-09 (×2): 200 mg via ORAL
  Filled 2020-12-08 (×2): qty 2

## 2020-12-08 MED ORDER — PRAZOSIN HCL 2 MG PO CAPS
2.0000 mg | ORAL_CAPSULE | Freq: Every day | ORAL | Status: DC
  Administered 2020-12-08: 2 mg via ORAL
  Filled 2020-12-08: qty 1

## 2020-12-08 MED ORDER — TRAZODONE HCL 50 MG PO TABS
50.0000 mg | ORAL_TABLET | Freq: Every day | ORAL | Status: DC
  Administered 2020-12-08: 50 mg via ORAL
  Filled 2020-12-08: qty 1

## 2020-12-08 MED ORDER — PROMETHAZINE HCL 25 MG/ML IJ SOLN: 6.2500 mg | INTRAMUSCULAR | Status: DC | PRN

## 2020-12-08 MED ORDER — OXYCODONE-ACETAMINOPHEN 5-325 MG PO TABS
1.0000 | ORAL_TABLET | ORAL | Status: DC | PRN
  Administered 2020-12-08: 2 via ORAL
  Filled 2020-12-08: qty 2

## 2020-12-08 MED ORDER — ONDANSETRON HCL 4 MG/2ML IJ SOLN
INTRAMUSCULAR | Status: DC | PRN
  Administered 2020-12-08: 4 mg via INTRAVENOUS

## 2020-12-08 MED ORDER — GUAIFENESIN-DM 100-10 MG/5ML PO SYRP: 15.0000 mL | ORAL_SOLUTION | ORAL | Status: DC | PRN

## 2020-12-08 MED ORDER — PHENOL 1.4 % MT LIQD: 1.0000 | OROMUCOSAL | Status: DC | PRN

## 2020-12-08 MED ORDER — DOCUSATE SODIUM 100 MG PO CAPS
100.0000 mg | ORAL_CAPSULE | Freq: Every day | ORAL | Status: DC
  Administered 2020-12-09: 100 mg via ORAL
  Filled 2020-12-08: qty 1

## 2020-12-08 MED ORDER — LABETALOL HCL 5 MG/ML IV SOLN
INTRAVENOUS | Status: AC
  Filled 2020-12-08: qty 4

## 2020-12-08 MED ORDER — SODIUM CHLORIDE 0.9 % IV SOLN: INTRAVENOUS | Status: DC

## 2020-12-08 MED ORDER — NITROGLYCERIN IN D5W 200-5 MCG/ML-% IV SOLN
INTRAVENOUS | Status: DC | PRN
  Administered 2020-12-08: 16.6 ug/min via INTRAVENOUS

## 2020-12-08 MED ORDER — SODIUM CHLORIDE 0.9 % IV SOLN
INTRAVENOUS | Status: DC | PRN
  Administered 2020-12-08: 500 mL

## 2020-12-08 MED ORDER — PRAVASTATIN SODIUM 40 MG PO TABS
80.0000 mg | ORAL_TABLET | Freq: Every day | ORAL | Status: DC
  Administered 2020-12-08: 80 mg via ORAL
  Filled 2020-12-08 (×2): qty 2

## 2020-12-08 MED ORDER — EPHEDRINE 5 MG/ML INJ
INTRAVENOUS | Status: AC
  Filled 2020-12-08: qty 20

## 2020-12-08 MED ORDER — LABETALOL HCL 5 MG/ML IV SOLN
10.0000 mg | INTRAVENOUS | Status: DC | PRN
  Administered 2020-12-08: 10 mg via INTRAVENOUS
  Filled 2020-12-08: qty 4

## 2020-12-08 MED ORDER — EPHEDRINE SULFATE-NACL 50-0.9 MG/10ML-% IV SOSY
PREFILLED_SYRINGE | INTRAVENOUS | Status: DC | PRN
  Administered 2020-12-08 (×2): 10 mg via INTRAVENOUS
  Administered 2020-12-08: 5 mg via INTRAVENOUS
  Administered 2020-12-08: 15 mg via INTRAVENOUS

## 2020-12-08 MED ORDER — AMLODIPINE BESYLATE 10 MG PO TABS
10.0000 mg | ORAL_TABLET | Freq: Every day | ORAL | Status: DC
  Administered 2020-12-09: 10 mg via ORAL
  Filled 2020-12-08: qty 1

## 2020-12-08 MED ORDER — ACETAMINOPHEN 325 MG PO TABS: 325.0000 mg | ORAL_TABLET | ORAL | Status: DC | PRN

## 2020-12-08 MED ORDER — ONDANSETRON HCL 4 MG/2ML IJ SOLN: 4.0000 mg | Freq: Four times a day (QID) | INTRAMUSCULAR | Status: DC | PRN

## 2020-12-08 MED ORDER — ONDANSETRON HCL 4 MG/2ML IJ SOLN
INTRAMUSCULAR | Status: AC
  Filled 2020-12-08: qty 2

## 2020-12-08 MED ORDER — DEXAMETHASONE SODIUM PHOSPHATE 10 MG/ML IJ SOLN
INTRAMUSCULAR | Status: AC
  Filled 2020-12-08: qty 1

## 2020-12-08 MED ORDER — CHLORHEXIDINE GLUCONATE CLOTH 2 % EX PADS: 6.0000 | MEDICATED_PAD | Freq: Once | CUTANEOUS | Status: DC

## 2020-12-08 MED ORDER — FENTANYL CITRATE (PF) 100 MCG/2ML IJ SOLN
INTRAMUSCULAR | Status: AC
  Filled 2020-12-08: qty 2

## 2020-12-08 MED ORDER — SODIUM CHLORIDE 0.9 % IV SOLN: 500.0000 mL | Freq: Once | INTRAVENOUS | Status: DC | PRN

## 2020-12-08 MED ORDER — TORSEMIDE 20 MG PO TABS
20.0000 mg | ORAL_TABLET | Freq: Every day | ORAL | Status: DC
  Administered 2020-12-09: 20 mg via ORAL
  Filled 2020-12-08 (×2): qty 1

## 2020-12-08 MED ORDER — HYDROMORPHONE HCL 1 MG/ML IJ SOLN: 0.5000 mg | INTRAMUSCULAR | Status: DC | PRN

## 2020-12-08 MED ORDER — DORZOLAMIDE HCL-TIMOLOL MAL 2-0.5 % OP SOLN
1.0000 [drp] | Freq: Every morning | OPHTHALMIC | Status: DC
  Filled 2020-12-08: qty 10

## 2020-12-08 MED ORDER — DEXAMETHASONE SODIUM PHOSPHATE 10 MG/ML IJ SOLN
INTRAMUSCULAR | Status: DC | PRN
  Administered 2020-12-08: 5 mg via INTRAVENOUS

## 2020-12-08 MED ORDER — 0.9 % SODIUM CHLORIDE (POUR BTL) OPTIME
TOPICAL | Status: DC | PRN
  Administered 2020-12-08: 2000 mL

## 2020-12-08 MED ORDER — CHLORHEXIDINE GLUCONATE 0.12 % MT SOLN: 15.0000 mL | Freq: Once | OROMUCOSAL | Status: AC

## 2020-12-08 MED ORDER — FENTANYL CITRATE (PF) 100 MCG/2ML IJ SOLN
25.0000 ug | INTRAMUSCULAR | Status: DC | PRN
  Administered 2020-12-08: 50 ug via INTRAVENOUS

## 2020-12-08 MED ORDER — PROPOFOL 10 MG/ML IV BOLUS
INTRAVENOUS | Status: AC
  Filled 2020-12-08: qty 20

## 2020-12-08 MED ORDER — HYDRALAZINE HCL 20 MG/ML IJ SOLN
5.0000 mg | INTRAMUSCULAR | Status: DC | PRN
  Filled 2020-12-08: qty 0.25

## 2020-12-08 MED ORDER — ACETAMINOPHEN 10 MG/ML IV SOLN: 1000.0000 mg | Freq: Once | INTRAVENOUS | Status: DC | PRN

## 2020-12-08 MED ORDER — ROCURONIUM BROMIDE 10 MG/ML (PF) SYRINGE
PREFILLED_SYRINGE | INTRAVENOUS | Status: DC | PRN
  Administered 2020-12-08: 5 mg via INTRAVENOUS
  Administered 2020-12-08: 60 mg via INTRAVENOUS

## 2020-12-08 MED ORDER — PANTOPRAZOLE SODIUM 40 MG PO TBEC
40.0000 mg | DELAYED_RELEASE_TABLET | Freq: Every day | ORAL | Status: DC
  Administered 2020-12-08 – 2020-12-09 (×2): 40 mg via ORAL
  Filled 2020-12-08 (×2): qty 1

## 2020-12-08 MED ORDER — LACTATED RINGERS IV SOLN: INTRAVENOUS | Status: DC

## 2020-12-08 MED ORDER — CHLORHEXIDINE GLUCONATE 0.12 % MT SOLN
OROMUCOSAL | Status: AC
  Administered 2020-12-08: 15 mL via OROMUCOSAL
  Filled 2020-12-08: qty 15

## 2020-12-08 MED ORDER — MAGNESIUM SULFATE 2 GM/50ML IV SOLN: 2.0000 g | Freq: Every day | INTRAVENOUS | Status: DC | PRN

## 2020-12-08 MED ORDER — PHENYLEPHRINE 40 MCG/ML (10ML) SYRINGE FOR IV PUSH (FOR BLOOD PRESSURE SUPPORT)
PREFILLED_SYRINGE | INTRAVENOUS | Status: DC | PRN
  Administered 2020-12-08: 200 ug via INTRAVENOUS
  Administered 2020-12-08: 80 ug via INTRAVENOUS
  Administered 2020-12-08 (×2): 20 ug via INTRAVENOUS
  Administered 2020-12-08: 40 ug via INTRAVENOUS
  Administered 2020-12-08: 80 ug via INTRAVENOUS

## 2020-12-08 MED ORDER — ORAL CARE MOUTH RINSE: 15.0000 mL | Freq: Once | OROMUCOSAL | Status: AC

## 2020-12-08 MED ORDER — PHENYLEPHRINE HCL-NACL 10-0.9 MG/250ML-% IV SOLN
INTRAVENOUS | Status: DC | PRN
  Administered 2020-12-08: 25 ug/min via INTRAVENOUS

## 2020-12-08 MED ORDER — CEFAZOLIN SODIUM-DEXTROSE 2-4 GM/100ML-% IV SOLN
2.0000 g | Freq: Three times a day (TID) | INTRAVENOUS | Status: AC
  Administered 2020-12-08 – 2020-12-09 (×2): 2 g via INTRAVENOUS
  Filled 2020-12-08 (×2): qty 100

## 2020-12-08 MED ORDER — ALBUTEROL SULFATE HFA 108 (90 BASE) MCG/ACT IN AERS
1.0000 | INHALATION_SPRAY | Freq: Four times a day (QID) | RESPIRATORY_TRACT | Status: DC | PRN
  Filled 2020-12-08: qty 6.7

## 2020-12-08 MED ORDER — SODIUM CHLORIDE 0.9 % IV SOLN
INTRAVENOUS | Status: AC
  Filled 2020-12-08: qty 1.2

## 2020-12-08 MED ORDER — ASPIRIN EC 81 MG PO TBEC
81.0000 mg | DELAYED_RELEASE_TABLET | Freq: Every day | ORAL | Status: DC
  Administered 2020-12-09: 81 mg via ORAL
  Filled 2020-12-08: qty 1

## 2020-12-08 MED ORDER — SUGAMMADEX SODIUM 200 MG/2ML IV SOLN
INTRAVENOUS | Status: DC | PRN
  Administered 2020-12-08: 200 mg via INTRAVENOUS

## 2020-12-08 MED ORDER — HEPARIN SODIUM (PORCINE) 1000 UNIT/ML IJ SOLN
INTRAMUSCULAR | Status: DC | PRN
  Administered 2020-12-08: 11000 [IU] via INTRAVENOUS

## 2020-12-08 MED ORDER — FINASTERIDE 5 MG PO TABS
5.0000 mg | ORAL_TABLET | Freq: Every day | ORAL | Status: DC
  Administered 2020-12-08: 5 mg via ORAL
  Filled 2020-12-08: qty 1

## 2020-12-08 MED ORDER — FERROUS SULFATE 325 (65 FE) MG PO TABS
325.0000 mg | ORAL_TABLET | Freq: Every day | ORAL | Status: DC
  Administered 2020-12-09: 325 mg via ORAL
  Filled 2020-12-08: qty 1

## 2020-12-08 MED ORDER — NITROGLYCERIN 0.4 MG SL SUBL: 0.4000 mg | SUBLINGUAL_TABLET | SUBLINGUAL | Status: DC | PRN

## 2020-12-08 MED ORDER — LIDOCAINE HCL 1 % IJ SOLN
INTRAMUSCULAR | Status: AC
  Filled 2020-12-08: qty 20

## 2020-12-08 MED ORDER — METOPROLOL TARTRATE 5 MG/5ML IV SOLN: 2.0000 mg | INTRAVENOUS | Status: DC | PRN

## 2020-12-08 MED ORDER — PROTAMINE SULFATE 10 MG/ML IV SOLN
INTRAVENOUS | Status: DC | PRN
  Administered 2020-12-08 (×3): 10 mg via INTRAVENOUS
  Administered 2020-12-08: 20 mg via INTRAVENOUS

## 2020-12-08 MED ORDER — ALUM & MAG HYDROXIDE-SIMETH 200-200-20 MG/5ML PO SUSP: 15.0000 mL | ORAL | Status: DC | PRN

## 2020-12-08 MED ORDER — PROTAMINE SULFATE 10 MG/ML IV SOLN
INTRAVENOUS | Status: AC
  Filled 2020-12-08: qty 5

## 2020-12-08 MED ORDER — INSULIN ASPART 100 UNIT/ML IJ SOLN
0.0000 [IU] | Freq: Three times a day (TID) | INTRAMUSCULAR | Status: DC
  Administered 2020-12-09: 2 [IU] via SUBCUTANEOUS

## 2020-12-08 MED ORDER — POLYETHYLENE GLYCOL 3350 17 G PO PACK: 17.0000 g | PACK | Freq: Every day | ORAL | Status: DC | PRN

## 2020-12-08 MED ORDER — AMITRIPTYLINE HCL 50 MG PO TABS
25.0000 mg | ORAL_TABLET | Freq: Every day | ORAL | Status: DC
  Administered 2020-12-08: 25 mg via ORAL
  Filled 2020-12-08: qty 1

## 2020-12-08 MED ORDER — LIDOCAINE 2% (20 MG/ML) 5 ML SYRINGE
INTRAMUSCULAR | Status: DC | PRN
  Administered 2020-12-08: 80 mg via INTRAVENOUS

## 2020-12-08 MED ORDER — PHENYLEPHRINE 40 MCG/ML (10ML) SYRINGE FOR IV PUSH (FOR BLOOD PRESSURE SUPPORT)
PREFILLED_SYRINGE | INTRAVENOUS | Status: AC
  Filled 2020-12-08: qty 10

## 2020-12-08 MED ORDER — GLYCOPYRROLATE PF 0.2 MG/ML IJ SOSY
PREFILLED_SYRINGE | INTRAMUSCULAR | Status: DC | PRN
  Administered 2020-12-08 (×2): .1 mg via INTRAVENOUS

## 2020-12-08 MED ORDER — STERILE WATER FOR IRRIGATION IR SOLN
Status: DC | PRN
  Administered 2020-12-08: 1000 mL

## 2020-12-08 MED ORDER — SUFENTANIL CITRATE 50 MCG/ML IV SOLN
INTRAVENOUS | Status: AC
  Filled 2020-12-08: qty 1

## 2020-12-08 MED ORDER — BISACODYL 10 MG RE SUPP: 10.0000 mg | Freq: Every day | RECTAL | Status: DC | PRN

## 2020-12-08 MED ORDER — SUFENTANIL CITRATE 50 MCG/ML IV SOLN
INTRAVENOUS | Status: DC | PRN
  Administered 2020-12-08: 10 ug via INTRAVENOUS
  Administered 2020-12-08 (×2): 5 ug via INTRAVENOUS
  Administered 2020-12-08: 10 ug via INTRAVENOUS
  Administered 2020-12-08: 5 ug via INTRAVENOUS

## 2020-12-08 MED ORDER — DEXMEDETOMIDINE (PRECEDEX) IN NS 20 MCG/5ML (4 MCG/ML) IV SYRINGE
PREFILLED_SYRINGE | INTRAVENOUS | Status: DC | PRN
  Administered 2020-12-08 (×2): 8 ug via INTRAVENOUS

## 2020-12-08 MED ORDER — PROPOFOL 10 MG/ML IV BOLUS
INTRAVENOUS | Status: DC | PRN
  Administered 2020-12-08: 120 mg via INTRAVENOUS
  Administered 2020-12-08: 40 mg via INTRAVENOUS

## 2020-12-08 MED ORDER — LOSARTAN POTASSIUM 25 MG PO TABS
25.0000 mg | ORAL_TABLET | Freq: Every day | ORAL | Status: DC
  Administered 2020-12-08 – 2020-12-09 (×2): 25 mg via ORAL
  Filled 2020-12-08 (×2): qty 1

## 2020-12-08 MED ORDER — POTASSIUM CHLORIDE CRYS ER 20 MEQ PO TBCR: 20.0000 meq | EXTENDED_RELEASE_TABLET | Freq: Every day | ORAL | Status: DC | PRN

## 2020-12-08 MED ORDER — NITROGLYCERIN IN D5W 200-5 MCG/ML-% IV SOLN
INTRAVENOUS | Status: DC | PRN
  Administered 2020-12-08: .4 mg via INTRAVENOUS
  Administered 2020-12-08: .06 mg via INTRAVENOUS
  Administered 2020-12-08: .8 mg via INTRAVENOUS
  Administered 2020-12-08 (×2): .4 mg via INTRAVENOUS

## 2020-12-08 SURGICAL SUPPLY — 39 items
CANISTER SUCT 3000ML PPV (MISCELLANEOUS) ×3 IMPLANT
CANNULA VESSEL 3MM 2 BLNT TIP (CANNULA) ×6 IMPLANT
CATH ROBINSON RED A/P 18FR (CATHETERS) ×3 IMPLANT
CLIP LIGATING EXTRA MED SLVR (CLIP) ×3 IMPLANT
CLIP LIGATING EXTRA SM BLUE (MISCELLANEOUS) ×3 IMPLANT
COVER WAND RF STERILE (DRAPES) ×3 IMPLANT
DECANTER SPIKE VIAL GLASS SM (MISCELLANEOUS) IMPLANT
DERMABOND ADVANCED (GAUZE/BANDAGES/DRESSINGS) ×1
DERMABOND ADVANCED .7 DNX12 (GAUZE/BANDAGES/DRESSINGS) ×2 IMPLANT
DRAIN CHANNEL 10F 3/8 F FF (DRAIN) IMPLANT
DRAIN HEMOVAC 1/8 X 5 (WOUND CARE) IMPLANT
ELECT REM PT RETURN 9FT ADLT (ELECTROSURGICAL) ×3
ELECTRODE REM PT RTRN 9FT ADLT (ELECTROSURGICAL) ×2 IMPLANT
EVACUATOR SILICONE 100CC (DRAIN) IMPLANT
GLOVE SS BIOGEL STRL SZ 7.5 (GLOVE) ×2 IMPLANT
GLOVE SUPERSENSE BIOGEL SZ 7.5 (GLOVE) ×1
GOWN STRL REUS W/ TWL LRG LVL3 (GOWN DISPOSABLE) ×6 IMPLANT
GOWN STRL REUS W/TWL LRG LVL3 (GOWN DISPOSABLE) ×9
KIT BASIN OR (CUSTOM PROCEDURE TRAY) ×3 IMPLANT
KIT SHUNT ARGYLE CAROTID ART 6 (VASCULAR PRODUCTS) ×3 IMPLANT
KIT TURNOVER KIT B (KITS) ×3 IMPLANT
NEEDLE 22X1 1/2 (OR ONLY) (NEEDLE) IMPLANT
NS IRRIG 1000ML POUR BTL (IV SOLUTION) ×6 IMPLANT
PACK CAROTID (CUSTOM PROCEDURE TRAY) ×3 IMPLANT
PAD ARMBOARD 7.5X6 YLW CONV (MISCELLANEOUS) ×6 IMPLANT
PATCH HEMASHIELD 8X75 (Vascular Products) ×3 IMPLANT
POSITIONER HEAD DONUT 9IN (MISCELLANEOUS) ×3 IMPLANT
SHUNT CAROTID BYPASS 10 (VASCULAR PRODUCTS) IMPLANT
SHUNT CAROTID BYPASS 12FRX15.5 (VASCULAR PRODUCTS) IMPLANT
SUT ETHILON 3 0 PS 1 (SUTURE) IMPLANT
SUT PROLENE 6 0 CC (SUTURE) ×18 IMPLANT
SUT SILK 3 0 (SUTURE)
SUT SILK 3-0 18XBRD TIE 12 (SUTURE) IMPLANT
SUT VIC AB 3-0 SH 27 (SUTURE) ×6
SUT VIC AB 3-0 SH 27X BRD (SUTURE) ×4 IMPLANT
SUT VICRYL 4-0 PS2 18IN ABS (SUTURE) ×3 IMPLANT
SYR CONTROL 10ML LL (SYRINGE) IMPLANT
TOWEL GREEN STERILE (TOWEL DISPOSABLE) ×3 IMPLANT
WATER STERILE IRR 1000ML POUR (IV SOLUTION) ×3 IMPLANT

## 2020-12-08 NOTE — Transfer of Care (Signed)
Immediate Anesthesia Transfer of Care Note  Patient: Kyle Daniel  Procedure(s) Performed: RIGHT CAROTID ENDARTERECTOMY (Right ) PATCH ANGIOPLASTY HEMASHIELD TO RIGHT INTERNAL CAROTID ARTERY (Right Neck)  Patient Location: PACU  Anesthesia Type:General  Level of Consciousness: drowsy and patient cooperative  Airway & Oxygen Therapy: Patient Spontanous Breathing and Patient connected to nasal cannula oxygen  Post-op Assessment: Report given to RN, Post -op Vital signs reviewed and stable and Patient moving all extremities X 4  Post vital signs: Reviewed and stable  Last Vitals:  Vitals Value Taken Time  BP    Temp    Pulse 76 12/08/20 1356  Resp 18 12/08/20 1356  SpO2 96 % 12/08/20 1356  Vitals shown include unvalidated device data.  Last Pain:  Vitals:   12/08/20 0807  TempSrc: Oral         Complications: No complications documented.

## 2020-12-08 NOTE — Anesthesia Procedure Notes (Addendum)
Procedure Name: Intubation Date/Time: 12/08/2020 11:08 AM Performed by: Moshe Salisbury, CRNA Pre-anesthesia Checklist: Patient identified, Emergency Drugs available, Suction available and Patient being monitored Patient Re-evaluated:Patient Re-evaluated prior to induction Oxygen Delivery Method: Circle System Utilized Preoxygenation: Pre-oxygenation with 100% oxygen Induction Type: IV induction Ventilation: Mask ventilation without difficulty Laryngoscope Size: Mac and 4 Grade View: Grade II Tube type: Oral Tube size: 8.0 mm Number of attempts: 1 Airway Equipment and Method: Stylet Placement Confirmation: ETT inserted through vocal cords under direct vision,  positive ETCO2 and breath sounds checked- equal and bilateral Secured at: 2 cm Tube secured with: Tape Dental Injury: Teeth and Oropharynx as per pre-operative assessment

## 2020-12-08 NOTE — Op Note (Signed)
OPERATIVE REPORT  DATE OF SURGERY: 12/08/2020  PATIENT: Kyle Daniel, 75 y.o. male MRN: [id number]  DOB: July 08, 1946  PRE-OPERATIVE DIAGNOSIS: Right Carotid Stenosis, Asymptomatic  POST-OPERATIVE DIAGNOSIS:  Same  PROCEDURE:  Right Carotid Endarterectomy with Dacron Patch Angioplasty  SURGEON:  Gretta Began, M.D.  PHYSICIAN ASSISTANT: Rhyne PAC  The assistant was needed for exposure and to expedite the case  ANESTHESIA:   general  EBL: Less than 200 ml  Total I/O In: 1000 [I.V.:1000] Out: -   BLOOD ADMINISTERED: none  DRAINS: none   SPECIMEN: none  COUNTS CORRECT:  YES  PLAN OF CARE: Admit to inpatient   PATIENT DISPOSITION:  PACU - hemodynamically stable and neurologically intact.  PROCEDURE DETAILS: The patient was taken to the operating room placed in supine position.  General anesthesia was administered.  The neck was prepped and draped in the usual sterile fashion.  An incision was made anterior to the sternocleidomastoid and carried down through the platysma with electrocautery.  The sternocleidomastoid was reflected posteriorly and the carotid sheath was opened.  The facial vein was ligated with 2-0 silk ties and divided.  The common carotid artery was encircled with an umbilical tape and Rummel tourniquet.  The vagus nerve was identified and preserved.  Dissection was continued onto the carotid bifurcation.  The superior thyroid artery was encircled with a 2-0 silk Potts tie.  The external carotid was encircled with a blue vessel loop and the internal carotid was encircled with an umbilical tape and Rummel tourniquet.  The hypoglossal nerve was identified and preserved.  The patient was given systemic heparin and after adequate circulation time, the internal, external and common carotid arteries were occluded with vascular clamps.  The common carotid artery was opened with an 11 blade and extended  longitudinally with Potts scissors.  There was excellent backbleeding from  the internal carotid and therefore a shunt was not used.  The plaque extended more proximally in the common carotid artery and the common carotid was occluded proximal to the plaque.  .  The endarterectomy was begun on the common carotid artery and the plaque was divided proximally with Potts scissors.  The endarterectomy was continued onto the bifurcation.  The external carotid was endarterectomized with an eversion technique and the internal carotid was endarterectomized in an open fashion.  Remaining atheromatous debris was removed from the endarterectomy plane.  There was significant redundance from tortuosity and therefore decision was made to resect a portion of the common carotid artery.  6-0 suture stay sutures were used to mark the area and approximately 1-1/2 cm of common carotid artery was resected.  The common carotid artery was sewn into into itself with a running 6-0 Prolene suture. A Finesse Hemashield Dacron patch was brought onto the field and was sewn as a patch angioplasty with a running 6-0 Prolene suture.   the usual flushing maneuvers were undertaken.  The anastomosis was completed and flow was restored first to the external and then the internal carotid artery.  Excellent flow characteristics were noted with hand-held Doppler in the internal and external carotid arteries.  The patient was given 50 mg of protamine to reverse the heparin.  The wounds were irrigated with saline.  Hemostasis was obtained with electrocautery.  The wounds were closed with 3-0 Vicryl to reapproximate the sternocleidomastoid over the carotid sheath.  The platysma was lysed with a running 3-0 Vicryl suture.  The skin was closed with a 4-0 subcuticular Vicryl stitch.  Dermabond was applied.  The patient was awakened neurologically intact in the operating room and transferred to the recovery room in stable condition   Gretta Began, M.D. 12/08/2020 1:53 PM

## 2020-12-08 NOTE — Discharge Instructions (Signed)
   [facility]  Discharge Instructions   Carotid Surgery  Please refer to the following instructions for your post-procedure care. Your surgeon or physician assistant will discuss any changes with you.  Activity  You are encouraged to walk as much as you can. You can slowly return to normal activities but must avoid strenuous activity and heavy lifting until your doctor tell you it's okay. Avoid activities such as vacuuming or swinging a golf club. You can drive after one week if you are comfortable and you are no longer taking prescription pain medications. It is normal to feel tired for serval weeks after your surgery. It is also normal to have difficulty with sleep habits, eating, and bowel movements after surgery. These will go away with time.  Bathing/Showering  Shower daily after you go home. Do not soak in a bathtub, hot tub, or swim until the incision heals completely.  Incision Care  Shower every day. Clean your incision with mild soap and water. Pat the area dry with a clean towel. You do not need a bandage unless otherwise instructed. Do not apply any ointments or creams to your incision. You may have skin glue on your incision. Do not peel it off. It will come off on its own in about one week. Your incision may feel thickened and raised for several weeks after your surgery. This is normal and the skin will soften over time.   For Men Only: It's okay to shave around the incision but do not shave the incision itself for 2 weeks. It is common to have numbness under your chin that could last for several months.  Diet  Resume your normal diet. There are no special food restrictions following this procedure. A low fat/low cholesterol diet is recommended for all patients with vascular disease. In order to heal from your surgery, it is CRITICAL to get adequate nutrition. Your body requires vitamins, minerals, and protein. Vegetables are the best source of  vitamins and minerals. Vegetables also provide the perfect balance of protein. Processed food has little nutritional value, so try to avoid this.  Medications  Resume taking all of your medications unless your doctor or physician assistant tells you not to. If your incision is causing pain, you may take over-the- counter pain relievers such as acetaminophen (Tylenol). If you were prescribed a stronger pain medication, please be aware these medications can cause nausea and constipation. Prevent nausea by taking the medication with a snack or meal. Avoid constipation by drinking plenty of fluids and eating foods with a high amount of fiber, such as fruits, vegetables, and grains.   Do not take Tylenol if you are taking prescription pain medications.  Follow Up  Our office will schedule a follow up appointment 2-3 weeks following discharge.  Please call us immediately for any of the following conditions  . Increased pain, redness, drainage (pus) from your incision site. . Fever of 101 degrees or higher. . If you should develop stroke (slurred speech, difficulty swallowing, weakness on one side of your body, loss of vision) you should call 911 and go to the nearest emergency room. .  Reduce your risk of vascular disease:  . Stop smoking. If you would like help call [facility] at 1-800-QUIT-NOW (1-800-784-8669) or [facility] at 336-586-4000. . Manage your cholesterol . Maintain a desired weight . Control your diabetes . Keep your blood pressure down .  If you have any questions, please call the office at [phone]. 

## 2020-12-08 NOTE — Progress Notes (Signed)
Patient arrived to 4 E from PACU.  Alert, oriented, verbal.  Vital signs, cardiac monitoring initiated per protocol.  CCMD notified.  CHG bath completed.  Spouse and daughter at bedside.

## 2020-12-08 NOTE — Interval H&P Note (Signed)
History and Physical Interval Note:  12/08/2020 10:06 AM  Kyle Daniel  has presented today for surgery, with the diagnosis of BILATERAL CAROTID STENOSIS.  The various methods of treatment have been discussed with the patient and family. After consideration of risks, benefits and other options for treatment, the patient has consented to  Procedure(s): RIGHT CAROTID ENDARTERECTOMY (Right) as a surgical intervention.  The patient's history has been reviewed, patient examined, no change in status, stable for surgery.  I have reviewed the patient's chart and labs.  Questions were answered to the patient's satisfaction.     Gretta Began

## 2020-12-08 NOTE — Plan of Care (Signed)
  Problem: Education: Goal: Knowledge of General Education information will improve Description Including pain rating scale, medication(s)/side effects and non-pharmacologic comfort measures Outcome: Progressing   Problem: Clinical Measurements: Goal: Respiratory complications will improve Outcome: Progressing   Problem: Activity: Goal: Risk for activity intolerance will decrease Outcome: Progressing   Problem: Coping: Goal: Level of anxiety will decrease Outcome: Progressing   Problem: Pain Managment: Goal: General experience of comfort will improve Outcome: Progressing   

## 2020-12-08 NOTE — Progress Notes (Signed)
  Day of Surgery Note    Subjective:  Resting in recovery   Vitals:   12/08/20 0807  BP: 133/77  Pulse: 76  Resp: 18  Temp: 98.1 F (36.7 C)  SpO2: 98%    Incisions:   Clean and dry Extremities:  Moving all extremities equally Cardiac:  regular Lungs:  Non labored Neuro:  In tact; tongue is midline   Assessment/Plan:  This is a 75 y.o. male who is s/p  Right CEA  -pt awake and following commands in recovery room -pt hypertensive in 170's currently.  Home antihypertensive medications have been ordered.      Doreatha Massed, PA-C 12/08/2020 1:58 PM [phone]

## 2020-12-09 ENCOUNTER — Encounter (HOSPITAL_COMMUNITY): Payer: Self-pay | Admitting: Vascular Surgery

## 2020-12-09 LAB — CBC
HCT: 28.9 % — ABNORMAL LOW (ref 39.0–52.0)
Hemoglobin: 9.2 g/dL — ABNORMAL LOW (ref 13.0–17.0)
MCH: 28.5 pg (ref 26.0–34.0)
MCHC: 31.8 g/dL (ref 30.0–36.0)
MCV: 89.5 fL (ref 80.0–100.0)
Platelets: 191 10*3/uL (ref 150–400)
RBC: 3.23 MIL/uL — ABNORMAL LOW (ref 4.22–5.81)
RDW: 14.6 % (ref 11.5–15.5)
WBC: 6.7 10*3/uL (ref 4.0–10.5)
nRBC: 0 % (ref 0.0–0.2)

## 2020-12-09 LAB — GLUCOSE, CAPILLARY: Glucose-Capillary: 145 mg/dL — ABNORMAL HIGH (ref 70–99)

## 2020-12-09 LAB — LIPID PANEL
Cholesterol: 102 mg/dL (ref 0–200)
HDL: 36 mg/dL — ABNORMAL LOW (ref >40)
LDL Cholesterol: 57 mg/dL (ref 0–99)
Total CHOL/HDL Ratio: 2.8 RATIO
Triglycerides: 44 mg/dL (ref <150)
VLDL: 9 mg/dL (ref 0–40)

## 2020-12-09 LAB — BASIC METABOLIC PANEL
Anion gap: 6 (ref 5–15)
BUN: 15 mg/dL (ref 8–23)
CO2: 23 mmol/L (ref 22–32)
Calcium: 8 mg/dL — ABNORMAL LOW (ref 8.9–10.3)
Chloride: 107 mmol/L (ref 98–111)
Creatinine, Ser: 1.33 mg/dL — ABNORMAL HIGH (ref 0.61–1.24)
GFR, Estimated: 56 mL/min — ABNORMAL LOW (ref >60)
Glucose, Bld: 145 mg/dL — ABNORMAL HIGH (ref 70–99)
Potassium: 3.8 mmol/L (ref 3.5–5.1)
Sodium: 136 mmol/L (ref 135–145)

## 2020-12-09 MED ORDER — OXYCODONE-ACETAMINOPHEN 5-325 MG PO TABS: 1.0000 | ORAL_TABLET | Freq: Four times a day (QID) | ORAL | 0 refills | Status: AC | PRN

## 2020-12-09 NOTE — Progress Notes (Signed)
A line discontinued as ordered, procedure well tolerated, pressure dressing applied to site, will continue to monitor.

## 2020-12-09 NOTE — Progress Notes (Signed)
D/C instructions given to patient. Wound care and medications reviewed. IV removed, clean and intact. Signs and symptoms of stroke reviewed. All questions answered.  Versie Starks, RN

## 2020-12-09 NOTE — Progress Notes (Addendum)
  Progress Note    12/09/2020 8:46 AM 1 Day Post-Op  Subjective:  Has a little soreness around incision and when he coughs but no difficulty swallowing.  Afebrile HR 60's 100's-120's systolic 97% RA  Gtts:   none  Vitals:   12/09/20 0500 12/09/20 0755  BP: (!) 113/55 (!) 111/57  Pulse:  72  Resp: 10 14  Temp:  97.6 F (36.4 C)  SpO2: 97% 97%    Physical Exam: Cardiac:  regular Lungs:  Non laboared Incisions:  Clean and dry Extremities:  Moving all extremities equally   CBC    Component Value Date/Time   WBC 6.7 12/09/2020 0452   RBC 3.23 (L) 12/09/2020 0452   HGB 9.2 (L) 12/09/2020 0452   HCT 28.9 (L) 12/09/2020 0452   PLT 191 12/09/2020 0452   MCV 89.5 12/09/2020 0452   MCH 28.5 12/09/2020 0452   MCHC 31.8 12/09/2020 0452   RDW 14.6 12/09/2020 0452   LYMPHSABS 1.7 09/21/2020 1233   MONOABS 0.3 09/21/2020 1233   EOSABS 0.1 09/21/2020 1233   BASOSABS 0.0 09/21/2020 1233    BMET    Component Value Date/Time   NA 136 12/09/2020 0452   K 3.8 12/09/2020 0452   CL 107 12/09/2020 0452   CO2 23 12/09/2020 0452   GLUCOSE 145 (H) 12/09/2020 0452   BUN 15 12/09/2020 0452   CREATININE 1.33 (H) 12/09/2020 0452   CALCIUM 8.0 (L) 12/09/2020 0452   GFRNONAA 56 (L) 12/09/2020 0452   GFRAA >60 01/29/2020 0410    INR    Component Value Date/Time   INR 1.0 12/02/2020 1130     Intake/Output Summary (Last 24 hours) at 12/09/2020 0846 Last data filed at 12/08/2020 2312 Gross per 24 hour  Intake 1927 ml  Output 320 ml  Net 1607 ml     Assessment:  75 y.o. male is s/p:  Right CEA  1 Day Post-Op  Plan: -pt doing well this morning and neuro in tact -pt has ambulated, voided -will discharge home later this morning.   -pt is on asa/statin    Doreatha Massed, PA-C [facility] [phone] 12/09/2020 8:46 AM  I have examined the patient, reviewed and agree with above.  Stable for discharge to home.  Follow-up in [place] office in  several weeks  Gretta Began, MD 12/09/2020 11:39 AM

## 2020-12-09 NOTE — Progress Notes (Addendum)
At 12:51, patient's daughter called stating they daughter and mom)  were hear to pick him up.  Daughter angry & concerned when she found him walking around out front by himself.   Pt noted in the back ground saying that he was the one that went out there by himself.  Patient was discharged at 11:47 am following a call from patient stating that his ride was here. At that time he was assisted to entrance A by volunteer staff Rayna Sexton who was dispatched to [id number] at 11:38 am.

## 2020-12-09 NOTE — Anesthesia Postprocedure Evaluation (Signed)
Anesthesia Post Note  Patient: Kyle Daniel  Procedure(s) Performed: RIGHT CAROTID ENDARTERECTOMY (Right ) PATCH ANGIOPLASTY HEMASHIELD TO RIGHT INTERNAL CAROTID ARTERY (Right Neck)     Patient location during evaluation: PACU Anesthesia Type: General Level of consciousness: awake and alert Pain management: pain level controlled Vital Signs Assessment: post-procedure vital signs reviewed and stable Respiratory status: spontaneous breathing, nonlabored ventilation, respiratory function stable and patient connected to nasal cannula oxygen Cardiovascular status: blood pressure returned to baseline and stable Postop Assessment: no apparent nausea or vomiting Anesthetic complications: no   No complications documented.  Last Vitals:  Vitals:   12/09/20 0400 12/09/20 0500  BP: (!) 114/57 (!) 113/55  Pulse:    Resp: 17 10  Temp:    SpO2: 96% 97%    Last Pain:  Vitals:   12/09/20 0359  TempSrc: Oral  PainSc:                  Nelle Don Reshad Saab

## 2020-12-09 NOTE — Discharge Summary (Signed)
Discharge Summary     Kyle Daniel 04/19/1946 75 y.o. male  [id number]  Admission Date: 12/08/2020  Discharge Date: 12/11/2020  Physician: No att. providers found  Admission Diagnosis: Asymptomatic carotid artery stenosis without infarction, right [I65.21]   HPI:   This is a 75 y.o. male who is here today for discussion of asymptomatic carotid stenosis.  He is here today with his wife.  He has known history of carotid disease which has been followed with serial ultrasounds.  Recently this appeared to have progressed to a critical level and he underwent CTA for further evaluation.  He is here for discussion of this.  He specifically denies any prior episodes of aphasia, amaurosis fugax, TIA or stroke.  He does have a history of coronary disease dating back 20 years.  Underwent coronary artery bypass grafting in 2004.  Reports that he is remained stable since that time.  He is not on any anticoagulation.  Is insulin-dependent diabetic and has hypertension and hyperlipidemia.  He does continue to smoke cigarettes and has no desire to quit.   Hospital Course:  The patient was admitted to the hospital and taken to the operating room on 12/08/2020 and underwent right carotid endarterectomy.    The pt tolerated the procedure well and was transported to the PACU in good condition.   By POD 1, the pt neuro status pt was doing well and neuro status was in tact.  He ambulated and voided and was discharged home.   Recent Labs    12/09/20 0452  NA 136  K 3.8  CL 107  CO2 23  GLUCOSE 145*  BUN 15  CALCIUM 8.0*   Recent Labs    12/09/20 0452  WBC 6.7  HGB 9.2*  HCT 28.9*  PLT 191   No results for input(s): INR in the last 72 hours.   Discharge Instructions    Call MD for:  redness, tenderness, or signs of infection (pain, swelling, bleeding, redness, odor or green/yellow discharge around incision site)   Complete by: As directed    Call MD for:  severe or increased pain, loss  or decreased feeling  in affected limb(s)   Complete by: As directed    Call MD for:  temperature >100.5   Complete by: As directed    Discharge patient   Complete by: As directed    Discharge home after Dr. Arbie Cookey has seen the pt.   Discharge disposition: 01-Home or Self Care   Discharge patient date: 12/09/2020   Resume previous diet   Complete by: As directed       Discharge Diagnosis:  Asymptomatic carotid artery stenosis without infarction, right [I65.21]  Secondary Diagnosis: Patient Active Problem List   Diagnosis Date Noted  . Asymptomatic carotid artery stenosis without infarction, right 12/08/2020  . Melena   . IDA (iron deficiency anemia) 02/19/2020  . Symptomatic anemia   . Acute GI bleeding 01/27/2020  . HTN (hypertension) 01/27/2020  . CAD (coronary artery disease)--three-vessel bypass in 2007 01/27/2020  . Diabetes mellitus type 2, uncontrolled (HCC) 01/27/2020  . Type 2 diabetes mellitus with other specified complication (HCC) 01/27/2020  . B12 deficiency 01/27/2020  . GERD (gastroesophageal reflux disease) 09/29/2010  . DIABETES MELLITUS, TYPE II 04/07/2009  . TOBACCO ABUSE 04/07/2009  . ATHEROSCLEROTIC CARDIOVASCULAR DISEASE 04/07/2009  . CEREBROVASCULAR DISEASE 04/07/2009  . DEGENERATIVE DISC DISEASE, CERVICAL SPINE 04/07/2009  . BENIGN PROSTATIC HYPERTROPHY, MILD, HX OF 04/07/2009  . Hyperlipidemia 04/06/2009  . HYPERTENSION 04/06/2009   Past  Medical History:  Diagnosis Date  . Anemia   . ASCVD (arteriosclerotic cardiovascular disease)    S/P CABG surgery  in 2004; cardiac cath in 2005 revealed patent grafts.  Marland Kitchen BPH (benign prostatic hypertrophy)   . Carotid artery occlusion   . Cerebrovascular disease    bilateral bruits  . Coronary artery disease   . DM (diabetes mellitus) (HCC)    AODM-- insulin requiring for the past 62yrs  . Hx of adenomatous colonic polyps   . Hyperlipidemia   . Hypertension   . Pneumonia 2020  . Tobacco abuse    50  pack years; current 1 pack per day    Allergies as of 12/09/2020      Reactions   Lisinopril Cough      Medication List    TAKE these medications   albuterol 108 (90 Base) MCG/ACT inhaler Commonly known as: VENTOLIN HFA Inhale 1-2 puffs into the lungs every 6 (six) hours as needed for wheezing or shortness of breath.   amitriptyline 25 MG tablet Commonly known as: ELAVIL Take 25 mg by mouth at bedtime.   amLODipine 10 MG tablet Commonly known as: NORVASC Take 1 tablet (10 mg total) by mouth daily.   aspirin 81 MG tablet Take 1 tablet (81 mg total) by mouth daily with breakfast.   cholecalciferol 25 MCG (1000 UNIT) tablet Commonly known as: VITAMIN D3 Take 1,000 Units by mouth every morning.   vitamin B-12 500 MCG tablet Commonly known as: CYANOCOBALAMIN Take 500 mcg by mouth daily.   cyanocobalamin 1000 MCG/ML injection Commonly known as: (VITAMIN B-12) Inject 1 mL (1,000 mcg total) into the muscle every 30 (thirty) days.   dorzolamide-timolol 22.3-6.8 MG/ML ophthalmic solution Commonly known as: COSOPT Place 1 drop into the right eye in the morning.   ferrous sulfate 325 (65 FE) MG tablet Take 325 mg by mouth daily with breakfast.   finasteride 5 MG tablet Commonly known as: PROSCAR Take 5 mg by mouth at bedtime.   fish oil-omega-3 fatty acids 1000 MG capsule Take 1 g by mouth 2 (two) times daily.   gabapentin 100 MG capsule Commonly known as: NEURONTIN Take 200 mg by mouth 2 (two) times daily.   insulin glargine 100 UNIT/ML injection Commonly known as: LANTUS Inject 28 Units into the skin in the morning and at bedtime.   insulin NPH Human 100 UNIT/ML injection Commonly known as: NOVOLIN N Inject 2-6 Units into the skin daily as needed (for blood sugar levels over 250).   losartan 25 MG tablet Commonly known as: COZAAR Take 1 tablet (25 mg total) by mouth daily.   nitroGLYCERIN 0.4 MG SL tablet Commonly known as: NITROSTAT Place 1 tablet (0.4 mg  total) under the tongue every 5 (five) minutes as needed for chest pain.   oxyCODONE-acetaminophen 5-325 MG tablet Commonly known as: Percocet Take 1 tablet by mouth every 6 (six) hours as needed for severe pain.   pravastatin 80 MG tablet Commonly known as: PRAVACHOL Take 1 tablet (80 mg total) by mouth daily.   prazosin 2 MG capsule Commonly known as: MINIPRESS Take 2 mg by mouth at bedtime.   torsemide 20 MG tablet Commonly known as: DEMADEX Take 20 mg by mouth daily.   traZODone 50 MG tablet Commonly known as: DESYREL Take 50 mg by mouth at bedtime.        [facility] Discharge Instructions Carotid Endarterectomy (CEA)  Please refer to the following instructions for your post-procedure care.  Your surgeon or physician assistant will discuss any changes with you.  Activity  You are encouraged to walk as much as you can. You can slowly return to normal activities but must avoid strenuous activity and heavy lifting until your doctor tell you it's OK. Avoid activities such as vacuuming or swinging a golf club. You can drive after one week if you are comfortable and you are no longer taking prescription pain medications. It is normal to feel tired for serval weeks after your surgery. It is also normal to have difficulty with sleep habits, eating, and bowel movements after surgery. These will go away with time.  Bathing/Showering  You may shower after you come home. Do not soak in a bathtub, hot tub, or swim until the incision heals completely.  Incision Care  Shower every day. Clean your incision with mild soap and water. Pat the area dry with a clean towel. You do not need a bandage unless otherwise instructed. Do not apply any ointments or creams to your incision. You may have skin glue on your incision. Do not peel it off. It will come off on its own in about one week. Your incision may feel thickened and raised for several weeks after your  surgery. This is normal and the skin will soften over time. For Men Only: It's OK to shave around the incision but do not shave the incision itself for 2 weeks. It is common to have numbness under your chin that could last for several months.  Diet  Resume your normal diet. There are no special food restrictions following this procedure. A low fat/low cholesterol diet is recommended for all patients with vascular disease. In order to heal from your surgery, it is CRITICAL to get adequate nutrition. Your body requires vitamins, minerals, and protein. Vegetables are the best source of vitamins and minerals. Vegetables also provide the perfect balance of protein. Processed food has little nutritional value, so try to avoid this.  Medications  Resume taking all of your medications unless your doctor or physician assistant tells you not to.  If your incision is causing pain, you may take over-the- counter pain relievers such as acetaminophen (Tylenol). If you were prescribed a stronger pain medication, please be aware these medications can cause nausea and constipation.  Prevent nausea by taking the medication with a snack or meal. Avoid constipation by drinking plenty of fluids and eating foods with a high amount of fiber, such as fruits, vegetables, and grains.  Do not take Tylenol if you are taking prescription pain medications.  Follow Up  Our office will schedule a follow up appointment 2-3 weeks following discharge.  Please call us immediately for any of the following conditions  . Increased pain, redness, drainage (pus) from your incision site. . Fever of 101 degrees or higher. . If you should develop stroke (slurred speech, difficulty swallowing, weakness on one side of your body, loss of vision) you should call 911 and go to the nearest emergency room. .  Reduce your risk of vascular disease:  . Stop smoking. If you would like help call [facility] at 1-800-QUIT-NOW (7328812532) or [facility] at (934)524-3868. . Manage your cholesterol . Maintain a desired weight . Control your diabetes . Keep your blood pressure down .  If you have any questions, please call the office at [phone].  Prescriptions given: 1.   Roxicet #8 No Refill  Disposition: home  Patient's condition: is Good  Follow up: 1. Dr. Arbie Cookey in 3-4  weeks in [place]   Doreatha MassedSamantha Kensley Lares, [place] JerseyPA-C [facility] [phone]   --- For [facility] use ---   Modified Rankin score at D/C (0-6): 0  IV medication needed for:  1. Hypertension: No 2. Hypotension: No  Post-op Complications: No  1. Post-op CVA or TIA: No  If yes: Event classification (right eye, left eye, right cortical, left cortical, verterobasilar, other): n/a  If yes: Timing of event (intra-op, <6 hrs post-op, >=6 hrs post-op, unknown): n/a  2. CN injury: No  If yes: CN n/a injuried   3. Myocardial infarction: No  If yes: Dx by (EKG or clinical, Troponin): n/a  4.  CHF: No  5.  Dysrhythmia (new): No  6. Wound infection: No  7. Reperfusion symptoms: No  8. Return to OR: No  If yes: return to OR for (bleeding, neurologic, other CEA incision, other): n/a  Discharge medications: Statin use:  Yes ASA use:  Yes   Beta blocker use:  No ACE-Inhibitor use:  No  ARB use:  Yes CCB use: Yes P2Y12 Antagonist use: No, [ ]  Plavix, [ ]  Plasugrel, [ ]  Ticlopinine, [ ]  Ticagrelor, [ ]  Other, [ ]  No for medical reason, [ ]  Non-compliant, [ ]  Not-indicated Anti-coagulant use:  No, [ ]  Warfarin, [ ]  Rivaroxaban, [ ]  Dabigatran,

## 2020-12-15 ENCOUNTER — Telehealth: Payer: Self-pay | Admitting: Vascular Surgery

## 2020-12-28 ENCOUNTER — Encounter: Payer: Medicare Other | Admitting: Vascular Surgery

## 2020-12-28 ENCOUNTER — Inpatient Hospital Stay (HOSPITAL_COMMUNITY): Payer: Medicare Other | Attending: Hematology

## 2020-12-28 ENCOUNTER — Other Ambulatory Visit: Payer: Self-pay

## 2020-12-28 DIAGNOSIS — D500 Iron deficiency anemia secondary to blood loss (chronic): Secondary | ICD-10-CM

## 2020-12-28 DIAGNOSIS — D509 Iron deficiency anemia, unspecified: Secondary | ICD-10-CM | POA: Diagnosis not present

## 2020-12-28 LAB — CBC WITH DIFFERENTIAL/PLATELET
Abs Immature Granulocytes: 0.03 10*3/uL (ref 0.00–0.07)
Basophils Absolute: 0 10*3/uL (ref 0.0–0.1)
Basophils Relative: 1 %
Eosinophils Absolute: 0.1 10*3/uL (ref 0.0–0.5)
Eosinophils Relative: 2 %
HCT: 32.9 % — ABNORMAL LOW (ref 39.0–52.0)
Hemoglobin: 10.5 g/dL — ABNORMAL LOW (ref 13.0–17.0)
Immature Granulocytes: 1 %
Lymphocytes Relative: 37 %
Lymphs Abs: 1.3 10*3/uL (ref 0.7–4.0)
MCH: 28.1 pg (ref 26.0–34.0)
MCHC: 31.9 g/dL (ref 30.0–36.0)
MCV: 88 fL (ref 80.0–100.0)
Monocytes Absolute: 0.2 10*3/uL (ref 0.1–1.0)
Monocytes Relative: 6 %
Neutro Abs: 1.8 10*3/uL (ref 1.7–7.7)
Neutrophils Relative %: 53 %
Platelets: 320 10*3/uL (ref 150–400)
RBC: 3.74 MIL/uL — ABNORMAL LOW (ref 4.22–5.81)
RDW: 14.2 % (ref 11.5–15.5)
WBC: 3.4 10*3/uL — ABNORMAL LOW (ref 4.0–10.5)
nRBC: 0 % (ref 0.0–0.2)

## 2020-12-28 LAB — IRON AND TIBC
Iron: 151 ug/dL (ref 45–182)
Saturation Ratios: 37 % (ref 17.9–39.5)
TIBC: 405 ug/dL (ref 250–450)
UIBC: 254 ug/dL

## 2020-12-28 LAB — FERRITIN: Ferritin: 15 ng/mL — ABNORMAL LOW (ref 24–336)

## 2020-12-29 ENCOUNTER — Inpatient Hospital Stay (HOSPITAL_COMMUNITY): Payer: Medicare Other

## 2021-01-04 NOTE — Progress Notes (Signed)
[facility] [address], [place] [address]   CLINIC:  Medical Oncology/Hematology  PCP:  Pearson Grippe, MD [address] [place] [address] [phone]   REASON FOR VISIT:  Follow-up for iron deficiency anemia  CURRENT THERAPY: Intermittent IV Feraheme infusions (last given 10/02/2020 and 10/09/2020)  INTERVAL HISTORY:  Kyle Daniel 75 y.o. male returns for routine follow-up of his iron deficiency anemia.  He was last seen on 09/28/2020 by Rojelio Brenner PA-C and Dr. Ellin Saba.  His last iron infusion was Feraheme on 10/02/2020 and 10/09/2020.  At today's visit, he reports feeling fairly well.  He denies any infections, hospitalizations, or changes in his overall health status since his last appointment.  He had vascular surgery (carotid endartectomy) on 12/08/2020, and has recovered very well.  His chronic iron deficiency anemia has been previously determined to be most likely from chronic slow GI bleed.  Although he has had previous gastroenterology work-up, no specific source of bleeding was localized.  He continues to report melena, but it is increased in frequency - states that he has melena with almost every bowel movement.  He last saw gastroenterology about 9 months ago.   He denies bright red hematochezia or frank rectal hemorrhage.  No hematuria, hemoptysis, hematemesis, epistaxis, or other sources of blood loss noted.   He reports that his energy improved after his last IV iron infusion in March 2022, but did not notice any significant improvement. He remains fatigued at today's appointment.  He reports occasional dizziness and headaches.   He denies any shortness of breath, chest pain, dyspnea on exertion, headache, fever, chills, night sweats, abnormal weight loss.  He has 70% energy and 100% appetite. He endorses that he is maintaining a stable weight.   REVIEW OF SYSTEMS:  Review of Systems  Constitutional:  Positive for fatigue.  Negative for appetite change, chills, diaphoresis, fever and unexpected weight change.  HENT:   Negative for lump/mass and nosebleeds.   Eyes:  Negative for eye problems.  Respiratory:  Negative for cough, hemoptysis and shortness of breath.   Cardiovascular:  Negative for chest pain, leg swelling and palpitations.  Gastrointestinal:  Positive for blood in stool. Negative for abdominal pain, constipation, diarrhea, nausea and vomiting.  Genitourinary:  Negative for hematuria.   Skin: Negative.   Neurological:  Positive for dizziness and headaches. Negative for light-headedness.  Hematological:  Does not bruise/bleed easily.     PAST MEDICAL/SURGICAL HISTORY:  Past Medical History:  Diagnosis Date   Anemia    ASCVD (arteriosclerotic cardiovascular disease)    S/P CABG surgery  in 2004; cardiac cath in 2005 revealed patent grafts.   BPH (benign prostatic hypertrophy)    Carotid artery occlusion    Cerebrovascular disease    bilateral bruits   Coronary artery disease    DM (diabetes mellitus) (HCC)    AODM-- insulin requiring for the past 67yrs   Hx of adenomatous colonic polyps    Hyperlipidemia    Hypertension    Pneumonia 2020   Tobacco abuse    50 pack years; current 1 pack per day   Past Surgical History:  Procedure Laterality Date   BIOPSY  01/28/2020   Procedure: BIOPSY;  Surgeon: Corbin Ade, MD;  [facility];  Service: Endoscopy;;   CERVICAL DISCECTOMY  1996   anterior cervical spine discectomy and fusion   CHOLECYSTECTOMY  06/17/2003   by Lebron Conners MD   COLONOSCOPY W/ POLYPECTOMY  4 adenomatous polyps   COLONOSCOPY WITH PROPOFOL N/A 01/29/2020   normal colon, normal appearing TI.    CORONARY ARTERY BYPASS GRAFT     ENDARTERECTOMY Right 12/08/2020   Procedure: RIGHT CAROTID ENDARTERECTOMY;  Surgeon: Larina Earthly, MD;  Location: [facility] OR;  Service: Vascular;  Laterality: Right;   ESOPHAGOGASTRODUODENOSCOPY  07/04/2007   Dr. Darrick Penna; normal  esophagus, erythema/edema in gastric antrum s/p biopsied, duodenitis.  Pathology positive for H. pylori.   ESOPHAGOGASTRODUODENOSCOPY (EGD) WITH PROPOFOL N/A 01/28/2020    mild erosive reflux esophagitis, s/p biopsy of gastric mucosa, normal duodenum overall except for minimally eroded second portion of mucosa s/p biopsy. Pathology: slight chronic gastritis, negative H.pylori, benign duodenal mucosa, no sprue.    GIVENS CAPSULE STUDY N/A 02/26/2020   Procedure: GIVENS CAPSULE STUDY;  Surgeon: Lanelle Bal, DO;  Location: [facility];  Service: Endoscopy;  Laterality: N/A;  7:30am   GIVENS CAPSULE STUDY N/A 03/12/2020   Procedure: GIVENS CAPSULE STUDY;  Surgeon: Corbin Ade, MD;  Location: [facility];  Service: Endoscopy;  Laterality: N/A;  7:30am, pt to arrive early to receive meds 30 mins prior to capsule   PATCH ANGIOPLASTY Right 12/08/2020   Procedure: PATCH ANGIOPLASTY HEMASHIELD TO RIGHT INTERNAL CAROTID ARTERY;  Surgeon: Larina Earthly, MD;  Location: [facility] OR;  Service: Vascular;  Laterality: Right;     SOCIAL HISTORY:  Social History   Socioeconomic History   Marital status: Married    Spouse name: valeria   Number of children: 3   Years of education: Not on file   Highest education level: Not on file  Occupational History   Occupation: disabled  Tobacco Use   Smoking status: Every Day    Packs/day: 1.50    Years: 50.00    Pack years: 75.00    Types: Cigarettes    Start date: 09/05/1960   Smokeless tobacco: Never  Vaping Use   Vaping Use: Never used  Substance and Sexual Activity   Alcohol use: No    Comment: quit 1980's   Drug use: No   Sexual activity: Not on file  Other Topics Concern   Not on file  Social History Narrative   Not on file   Social Determinants of Health   Financial Resource Strain: Low Risk    Difficulty of Paying Living Expenses: Not hard at all  Food Insecurity: No Food Insecurity   Worried About Programme researcher, broadcasting/film/video in the Last Year:  Never true   Ran Out of Food in the Last Year: Never true  Transportation Needs: No Transportation Needs   Lack of Transportation (Medical): No   Lack of Transportation (Non-Medical): No  Physical Activity: Inactive   Days of Exercise per Week: 0 days   Minutes of Exercise per Session: 0 min  Stress: No Stress Concern Present   Feeling of Stress : Only a little  Social Connections: Press photographer of Communication with Friends and Family: More than three times a week   Frequency of Social Gatherings with Friends and Family: More than three times a week   Attends Religious Services: 1 to 4 times per year   Active Member of [employer] or Organizations: Yes   Attends Engineer, structural: More than 4 times per year   Marital Status: Married  Catering manager Violence: Not At Risk   Fear of Current or Ex-Partner: No   Emotionally Abused: No   Physically Abused: No   Sexually Abused: No  FAMILY HISTORY:  Family History  Problem Relation Age of Onset   Pneumonia Mother 45       complications of diabetes   Diabetes Mother    Pneumonia Father 63       complication of pneumonia   Cancer Sister        breast   Cancer Sister        breast cancer   Colon cancer Neg Hx     CURRENT MEDICATIONS:  Outpatient Encounter Medications as of 01/05/2021  Medication Sig Note   albuterol (VENTOLIN HFA) 108 (90 Base) MCG/ACT inhaler Inhale 1-2 puffs into the lungs every 6 (six) hours as needed for wheezing or shortness of breath.     amitriptyline (ELAVIL) 25 MG tablet Take 25 mg by mouth at bedtime.    amLODipine (NORVASC) 10 MG tablet Take 1 tablet (10 mg total) by mouth daily.    aspirin 81 MG tablet Take 1 tablet (81 mg total) by mouth daily with breakfast.    cholecalciferol (VITAMIN D3) 25 MCG (1000 UNIT) tablet Take 1,000 Units by mouth every morning.    cyanocobalamin (,VITAMIN B-12,) 1000 MCG/ML injection Inject 1 mL (1,000 mcg total) into the muscle every 30  (thirty) days. (Patient not taking: No sig reported)    dorzolamide-timolol (COSOPT) 22.3-6.8 MG/ML ophthalmic solution Place 1 drop into the right eye in the morning.    ferrous sulfate 325 (65 FE) MG tablet Take 325 mg by mouth daily with breakfast.    finasteride (PROSCAR) 5 MG tablet Take 5 mg by mouth at bedtime.    fish oil-omega-3 fatty acids 1000 MG capsule Take 1 g by mouth 2 (two) times daily.    gabapentin (NEURONTIN) 100 MG capsule Take 200 mg by mouth 2 (two) times daily.     insulin glargine (LANTUS) 100 UNIT/ML injection Inject 28 Units into the skin in the morning and at bedtime. 12/08/2020: Took 14 units   insulin NPH (HUMULIN N,NOVOLIN N) 100 UNIT/ML injection Inject 2-6 Units into the skin daily as needed (for blood sugar levels over 250).    losartan (COZAAR) 25 MG tablet Take 1 tablet (25 mg total) by mouth daily.    nitroGLYCERIN (NITROSTAT) 0.4 MG SL tablet Place 1 tablet (0.4 mg total) under the tongue every 5 (five) minutes as needed for chest pain.    oxyCODONE-acetaminophen (PERCOCET) 5-325 MG tablet Take 1 tablet by mouth every 6 (six) hours as needed for severe pain.    pravastatin (PRAVACHOL) 80 MG tablet Take 1 tablet (80 mg total) by mouth daily.    prazosin (MINIPRESS) 2 MG capsule Take 2 mg by mouth at bedtime.    torsemide (DEMADEX) 20 MG tablet Take 20 mg by mouth daily.    traZODone (DESYREL) 50 MG tablet Take 50 mg by mouth at bedtime.    vitamin B-12 (CYANOCOBALAMIN) 500 MCG tablet Take 500 mcg by mouth daily.    No facility-administered encounter medications on file as of 01/05/2021.    ALLERGIES:  Allergies  Allergen Reactions   Lisinopril Cough     PHYSICAL EXAM:  ECOG PERFORMANCE STATUS: 1 - Symptomatic but completely ambulatory  There were no vitals filed for this visit. There were no vitals filed for this visit. Physical Exam Constitutional:      Appearance: Normal appearance. He is obese.  HENT:     Head: Normocephalic and atraumatic.      Mouth/Throat:     Mouth: Mucous membranes are moist.  Eyes:  Extraocular Movements: Extraocular movements intact.     Pupils: Pupils are equal, round, and reactive to light.  Cardiovascular:     Rate and Rhythm: Normal rate and regular rhythm.     Pulses: Normal pulses.     Heart sounds: Normal heart sounds.  Pulmonary:     Effort: Pulmonary effort is normal.     Breath sounds: Normal breath sounds.  Abdominal:     General: Bowel sounds are normal.     Palpations: Abdomen is soft.     Tenderness: There is no abdominal tenderness.  Musculoskeletal:        General: No swelling.     Right lower leg: No edema.     Left lower leg: No edema.  Lymphadenopathy:     Cervical: No cervical adenopathy.  Skin:    General: Skin is warm and dry.  Neurological:     General: No focal deficit present.     Mental Status: He is alert and oriented to person, place, and time.  Psychiatric:        Mood and Affect: Mood normal.        Behavior: Behavior normal.     LABORATORY DATA:  I have reviewed the labs as listed.  CBC    Component Value Date/Time   WBC 3.4 (L) 12/28/2020 1027   RBC 3.74 (L) 12/28/2020 1027   HGB 10.5 (L) 12/28/2020 1027   HCT 32.9 (L) 12/28/2020 1027   PLT 320 12/28/2020 1027   MCV 88.0 12/28/2020 1027   MCH 28.1 12/28/2020 1027   MCHC 31.9 12/28/2020 1027   RDW 14.2 12/28/2020 1027   LYMPHSABS 1.3 12/28/2020 1027   MONOABS 0.2 12/28/2020 1027   EOSABS 0.1 12/28/2020 1027   BASOSABS 0.0 12/28/2020 1027   CMP Latest Ref Rng & Units 12/09/2020 12/02/2020 10/28/2020  Glucose 70 - 99 mg/dL 413(K) 440(N) -  BUN 8 - 23 mg/dL 15 10 -  Creatinine 0.27 - 1.24 mg/dL 2.53(G) 6.44(I) 3.47(Q)  Sodium 135 - 145 mmol/L 136 139 -  Potassium 3.5 - 5.1 mmol/L 3.8 3.5 -  Chloride 98 - 111 mmol/L 107 107 -  CO2 22 - 32 mmol/L 23 26 -  Calcium 8.9 - 10.3 mg/dL 8.0(L) 8.7(L) -  Total Protein 6.5 - 8.1 g/dL - 6.8 -  Total Bilirubin 0.3 - 1.2 mg/dL - 2.5(Z) -  Alkaline Phos 38 -  126 U/L - 41 -  AST 15 - 41 U/L - 16 -  ALT 0 - 44 U/L - 19 -    DIAGNOSTIC IMAGING:  I have independently reviewed the relevant imaging and discussed with the patient.  ASSESSMENT: 1.  Chronic iron deficiency anemia:  -Admitted to the hospital from 01/27/2020 through 01/29/2020 with dizziness and dyspnea on exertion. Found to have hemoglobin 5.6, received blood transfusions. -History of GI bleed in October 2020 at [facility] in [place], required 5 units of PRBC. -EGD on 01/28/2020 and colonoscopy on 01/29/2019 were nonrevealing. -Capsule study on 03/12/2020, stomach with several images of blood flecks or wisps of blood without obvious source.  Multiple images of duodenal, proximal jejunal areas with what appears lymphangiectasia's without active bleeding.  Further along in the study, images with possible erosions/ulcers. -Other nutritional deficiency work-up was negative.  SPEP was negative. -Feraheme infusion most recently given 10/02/2020 and 10/09/2020 - Most recent labs (12/28/2020) show Hgb 10.5/MCV 88.0, ferritin 15, iron saturation 37%   2.  Social/family history: -1 and half pack per day current active smoker  for 56 years. -2 sisters had breast cancer.     PLAN:  1.  Chronic iron deficiency anemia:  -He notes some improvement in his energy levels after last Feraheme infusion. -Labs reviewed from(12/28/2020) show Hgb 10.5/MCV 88.0, ferritin 15, iron saturation 37% -He is continuing to have black stools, but with increased frequency -We have recommended 2 more infusions of Feraheme  - May need to follow him more closely as his ferritin/Hgb has been remaining low despite iron infusions -Follow-up in 2 months with repeat labs including CBC, ferritin and iron panel. --Instructed patient to follow-up with GI due to increased frequency of melena   2.  Smoking history: -Reviewed results of CT chest from 05/26/2020 which was lung RADS 1. -Continue annual imaging for lung cancer screening as  indicated    PLAN SUMMARY & DISPOSITION: --IV Feraheme x 2 --Recommend GI follow-up  --RTC in 8 weeks for repeat labs --Phone visit in 8 weeks  All questions were answered. The patient knows to call the clinic with any problems, questions or concerns.  Medical decision making: Moderate (chronic condition with exacerbation, review of previous tests, ordering new tests)  Time spent on visit: I spent 15 minutes counseling the patient face to face. The total time spent in the appointment was 25 minutes and more than 50% was on counseling.   Carnella GuadalajaraRebekah M Jazon Jipson, PA-C  01/05/21 11:04 AM

## 2021-01-05 ENCOUNTER — Inpatient Hospital Stay (HOSPITAL_BASED_OUTPATIENT_CLINIC_OR_DEPARTMENT_OTHER): Payer: Medicare Other | Admitting: Physician Assistant

## 2021-01-05 ENCOUNTER — Other Ambulatory Visit: Payer: Self-pay

## 2021-01-05 ENCOUNTER — Ambulatory Visit (HOSPITAL_COMMUNITY): Payer: Medicare Other | Admitting: Hematology

## 2021-01-05 VITALS — BP 132/64 | HR 78 | Temp 97.0°F | Resp 16 | Wt 262.3 lb

## 2021-01-05 DIAGNOSIS — D500 Iron deficiency anemia secondary to blood loss (chronic): Secondary | ICD-10-CM

## 2021-01-05 DIAGNOSIS — D509 Iron deficiency anemia, unspecified: Secondary | ICD-10-CM | POA: Diagnosis not present

## 2021-01-05 NOTE — Patient Instructions (Signed)
[facility] at [facility] Discharge Instructions  You were seen today by Rojelio Brenner PA-C for your anemia.  Your labs show slightly worsened anemia and low iron stores, despite giving you iron in March 2022.  I am concerned that you are continuing to slowly lose blood from your intestinal tract.  We will schedule you for more IV iron, but I would also like you to make an appointment to see your GI (gastroenterology) doctor for their opinion.  LABS: Return in 8 weeks for labs   OTHER TESTS: Make an appointment with your GI doctor.  MEDICATIONS: We will schedule you for IV iron  FOLLOW-UP APPOINTMENT: Phone Visit in 8 weeks   Thank you for choosing  [facility] at [facility] to provide your oncology and hematology care.  To afford each patient quality time with our provider, please arrive at least 15 minutes before your scheduled appointment time.   If you have a lab appointment with the Cancer Center please come in thru the Main Entrance and check in at the main information desk.  You need to re-schedule your appointment should you arrive 10 or more minutes late.  We strive to give you quality time with our providers, and arriving late affects you and other patients whose appointments are after yours.  Also, if you no show three or more times for appointments you may be dismissed from the clinic at the providers discretion.     Again, thank you for choosing [facility].  Our hope is that these requests will decrease the amount of time that you wait before being seen by our physicians.       _____________________________________________________________  Should you have questions after your visit to [facility], please contact our office at [phone] and follow the prompts.  Our office hours are 8:00 a.m. and 4:30 p.m. Monday - Friday.  Please note that voicemails left after 4:00 p.m. may not be returned until the  following business day.  We are closed weekends and major holidays.  You do have access to a nurse 24-7, just call the main number to the clinic [phone] and do not press any options, hold on the line and a nurse will answer the phone.    For prescription refill requests, have your pharmacy contact our office and allow 72 hours.    Due to Covid, you will need to wear a mask upon entering the hospital. If you do not have a mask, a mask will be given to you at the Main Entrance upon arrival. For doctor visits, patients may have 1 support person age 29 or older with them. For treatment visits, patients can not have anyone with them due to social distancing guidelines and our immunocompromised population.

## 2021-01-08 ENCOUNTER — Other Ambulatory Visit: Payer: Self-pay

## 2021-01-08 ENCOUNTER — Inpatient Hospital Stay (HOSPITAL_COMMUNITY): Payer: Medicare Other

## 2021-01-08 ENCOUNTER — Encounter (HOSPITAL_COMMUNITY): Payer: Self-pay

## 2021-01-08 VITALS — BP 120/51 | HR 76 | Temp 97.0°F | Resp 19

## 2021-01-08 DIAGNOSIS — D509 Iron deficiency anemia, unspecified: Secondary | ICD-10-CM | POA: Diagnosis not present

## 2021-01-08 DIAGNOSIS — K922 Gastrointestinal hemorrhage, unspecified: Secondary | ICD-10-CM

## 2021-01-08 DIAGNOSIS — D500 Iron deficiency anemia secondary to blood loss (chronic): Secondary | ICD-10-CM

## 2021-01-08 MED ORDER — ACETAMINOPHEN 325 MG PO TABS
ORAL_TABLET | ORAL | Status: AC
  Filled 2021-01-08: qty 2

## 2021-01-08 MED ORDER — PEGFILGRASTIM-CBQV 6 MG/0.6ML ~~LOC~~ SOSY
PREFILLED_SYRINGE | SUBCUTANEOUS | Status: AC
  Filled 2021-01-08: qty 0.6

## 2021-01-08 MED ORDER — LORATADINE 10 MG PO TABS
ORAL_TABLET | ORAL | Status: AC
  Filled 2021-01-08: qty 1

## 2021-01-08 MED ORDER — ACETAMINOPHEN 325 MG PO TABS
650.0000 mg | ORAL_TABLET | Freq: Once | ORAL | Status: AC
Start: 2021-01-08 — End: 2021-01-08
  Administered 2021-01-08: 650 mg via ORAL

## 2021-01-08 MED ORDER — LORATADINE 10 MG PO TABS
10.0000 mg | ORAL_TABLET | Freq: Once | ORAL | Status: AC
  Administered 2021-01-08: 10 mg via ORAL

## 2021-01-08 MED ORDER — FERUMOXYTOL INJECTION 510 MG/17 ML
510.0000 mg | Freq: Once | INTRAVENOUS | Status: AC
  Administered 2021-01-08: 510 mg via INTRAVENOUS
  Filled 2021-01-08: qty 510

## 2021-01-08 MED ORDER — SODIUM CHLORIDE 0.9 % IV SOLN
Freq: Once | INTRAVENOUS | Status: AC
Start: 2021-01-08 — End: 2021-01-08

## 2021-01-08 NOTE — Patient Instructions (Signed)
[facility] CANCER CENTER  Discharge Instructions: Thank you for choosing [facility] to provide your oncology and hematology care.  If you have a lab appointment with the Cancer Center, please come in thru the Main Entrance and check in at the main information desk.  We strive to give you quality time with your provider. You may need to reschedule your appointment if you arrive late (15 or more minutes).  Arriving late affects you and other patients whose appointments are after yours.  Also, if you miss three or more appointments without notifying the office, you may be dismissed from the clinic at the provider's discretion.      For prescription refill requests, have your pharmacy contact our office and allow 72 hours for refills to be completed.    Today you received the following . Feraheme infusion.         BELOW ARE SYMPTOMS THAT SHOULD BE REPORTED IMMEDIATELY: *FEVER GREATER THAN 100.4 F (38 C) OR HIGHER *CHILLS OR SWEATING *NAUSEA AND VOMITING THAT IS NOT CONTROLLED WITH YOUR NAUSEA MEDICATION *UNUSUAL SHORTNESS OF BREATH *UNUSUAL BRUISING OR BLEEDING *URINARY PROBLEMS (pain or burning when urinating, or frequent urination) *BOWEL PROBLEMS (unusual diarrhea, constipation, pain near the anus) TENDERNESS IN MOUTH AND THROAT WITH OR WITHOUT PRESENCE OF ULCERS (sore throat, sores in mouth, or a toothache) UNUSUAL RASH, SWELLING OR PAIN  UNUSUAL VAGINAL DISCHARGE OR ITCHING   Items with * indicate a potential emergency and should be followed up as soon as possible or go to the Emergency Department if any problems should occur.  Please show the CHEMOTHERAPY ALERT CARD or IMMUNOTHERAPY ALERT CARD at check-in to the Emergency Department and triage nurse.  Should you have questions after your visit or need to cancel or reschedule your appointment, please contact [facility] [phone]  and follow the prompts.  Office hours are 8:00 a.m. to 4:30 p.m. Monday -  Friday. Please note that voicemails left after 4:00 p.m. may not be returned until the following business day.  We are closed weekends and major holidays. You have access to a nurse at all times for urgent questions. Please call the main number to the clinic [phone] and follow the prompts.  For any non-urgent questions, you may also contact your provider using MyChart. We now offer e-Visits for anyone 69 and older to request care online for non-urgent symptoms. For details visit [email or web address].   Also download the MyChart app! Go to the app store, search "MyChart", open the app, select [place], and log in with your MyChart username and password.  Due to Covid, a mask is required upon entering the hospital/clinic. If you do not have a mask, one will be given to you upon arrival. For doctor visits, patients may have 1 support person aged 51 or older with them. For treatment visits, patients cannot have anyone with them due to current Covid guidelines and our immunocompromised population.

## 2021-01-08 NOTE — Progress Notes (Signed)
Patient presents today for Feraheme IV infusion. Vital signs are stable. Patient denies any changes since the last iron infusion. Patient denies any complaints today. MAR reviewed and updated.    Feraheme given today per MD orders. Tolerated infusion without adverse affects. Vital signs stable. No complaints at this time. Discharged from clinic ambulatory in stable condition. Alert and oriented x 3. F/U with [facility] as scheduled.

## 2021-01-15 ENCOUNTER — Other Ambulatory Visit: Payer: Self-pay

## 2021-01-15 ENCOUNTER — Inpatient Hospital Stay (HOSPITAL_COMMUNITY): Payer: Medicare Other

## 2021-01-15 VITALS — BP 114/50 | HR 69 | Temp 98.9°F | Resp 18

## 2021-01-15 DIAGNOSIS — D500 Iron deficiency anemia secondary to blood loss (chronic): Secondary | ICD-10-CM

## 2021-01-15 DIAGNOSIS — D509 Iron deficiency anemia, unspecified: Secondary | ICD-10-CM | POA: Diagnosis not present

## 2021-01-15 DIAGNOSIS — K922 Gastrointestinal hemorrhage, unspecified: Secondary | ICD-10-CM

## 2021-01-15 MED ORDER — SODIUM CHLORIDE 0.9 % IV SOLN
510.0000 mg | Freq: Once | INTRAVENOUS | Status: AC
  Administered 2021-01-15: 510 mg via INTRAVENOUS
  Filled 2021-01-15: qty 510

## 2021-01-15 MED ORDER — LORATADINE 10 MG PO TABS
10.0000 mg | ORAL_TABLET | Freq: Once | ORAL | Status: AC
  Administered 2021-01-15: 10 mg via ORAL

## 2021-01-15 MED ORDER — SODIUM CHLORIDE 0.9 % IV SOLN
Freq: Once | INTRAVENOUS | Status: AC
Start: 2021-01-15 — End: 2021-01-15

## 2021-01-15 MED ORDER — ACETAMINOPHEN 325 MG PO TABS
650.0000 mg | ORAL_TABLET | Freq: Once | ORAL | Status: AC
  Administered 2021-01-15: 650 mg via ORAL

## 2021-01-15 MED ORDER — ACETAMINOPHEN 325 MG PO TABS
ORAL_TABLET | ORAL | Status: AC
  Filled 2021-01-15: qty 2

## 2021-01-15 MED ORDER — LORATADINE 10 MG PO TABS
ORAL_TABLET | ORAL | Status: AC
  Filled 2021-01-15: qty 1

## 2021-01-15 NOTE — Progress Notes (Signed)
Patient presents today for Feraheme infusion per providers orders.  Vital signs WNL.  Patient has no new complaints since last treatment.  Peripheral IV started and blood return noted pre and post infusion.  Feraheme infusion given today per MD orders.  Stable during infusion without adverse affects.  Vital signs stable.  Patient unwilling to stay the full thirty minute post infusion wait time.  No complaints at this time.  Discharge from clinic ambulatory in stable condition.  Alert and oriented X 3.  Follow up with [facility] as scheduled.

## 2021-01-15 NOTE — Patient Instructions (Signed)
[facility]  Discharge Instructions: Thank you for choosing [facility] to provide your oncology and hematology care.  If you have a lab appointment with the Cancer Center, please come in thru the Main Entrance and check in at the main information desk.  Wear comfortable clothing and clothing appropriate for easy access to any Portacath or PICC line.   We strive to give you quality time with your provider. You may need to reschedule your appointment if you arrive late (15 or more minutes).  Arriving late affects you and other patients whose appointments are after yours.  Also, if you miss three or more appointments without notifying the office, you may be dismissed from the clinic at the provider's discretion.      For prescription refill requests, have your pharmacy contact our office and allow 72 hours for refills to be completed.    Today you received the following chemotherapy and/or immunotherapy agents Feraheme infusion      To help prevent nausea and vomiting after your treatment, we encourage you to take your nausea medication as directed.  BELOW ARE SYMPTOMS THAT SHOULD BE REPORTED IMMEDIATELY: *FEVER GREATER THAN 100.4 F (38 C) OR HIGHER *CHILLS OR SWEATING *NAUSEA AND VOMITING THAT IS NOT CONTROLLED WITH YOUR NAUSEA MEDICATION *UNUSUAL SHORTNESS OF BREATH *UNUSUAL BRUISING OR BLEEDING *URINARY PROBLEMS (pain or burning when urinating, or frequent urination) *BOWEL PROBLEMS (unusual diarrhea, constipation, pain near the anus) TENDERNESS IN MOUTH AND THROAT WITH OR WITHOUT PRESENCE OF ULCERS (sore throat, sores in mouth, or a toothache) UNUSUAL RASH, SWELLING OR PAIN  UNUSUAL VAGINAL DISCHARGE OR ITCHING   Items with * indicate a potential emergency and should be followed up as soon as possible or go to the Emergency Department if any problems should occur.  Please show the CHEMOTHERAPY ALERT CARD or IMMUNOTHERAPY ALERT CARD at check-in to the  Emergency Department and triage nurse.  Should you have questions after your visit or need to cancel or reschedule your appointment, please contact [facility] [phone]  and follow the prompts.  Office hours are 8:00 a.m. to 4:30 p.m. Monday - Friday. Please note that voicemails left after 4:00 p.m. may not be returned until the following business day.  We are closed weekends and major holidays. You have access to a nurse at all times for urgent questions. Please call the main number to the clinic [phone] and follow the prompts.  For any non-urgent questions, you may also contact your provider using MyChart. We now offer e-Visits for anyone 18 and older to request care online for non-urgent symptoms. For details visit [email or web address].   Also download the MyChart app! Go to the app store, search "MyChart", open the app, select , and log in with your MyChart username and password.  Due to Covid, a mask is required upon entering the hospital/clinic. If you do not have a mask, one will be given to you upon arrival. For doctor visits, patients may have 1 support person aged 18 or older with them. For treatment visits, patients cannot have anyone with them due to current Covid guidelines and our immunocompromised population.  

## 2021-01-27 ENCOUNTER — Encounter: Payer: Self-pay | Admitting: Vascular Surgery

## 2021-01-27 ENCOUNTER — Other Ambulatory Visit: Payer: Self-pay

## 2021-01-27 ENCOUNTER — Ambulatory Visit (INDEPENDENT_AMBULATORY_CARE_PROVIDER_SITE_OTHER): Payer: Medicare Other | Admitting: Vascular Surgery

## 2021-01-27 VITALS — BP 107/70 | HR 85 | Temp 97.7°F | Ht 73.0 in | Wt 263.0 lb

## 2021-01-27 DIAGNOSIS — Z9889 Other specified postprocedural states: Secondary | ICD-10-CM

## 2021-01-27 NOTE — Progress Notes (Signed)
[facility]  Patient name: Kyle Daniel MRN: [id number] DOB: [birth year] Sex: male  REASON FOR VISIT: Follow-up right carotid endarterectomy  HPI: Kyle Daniel is a 75 y.o. male here today for follow-up.  Underwent right carotid endarterectomy for severe asymptomatic disease on 12/08/2020.  He had no postoperative complications and was discharged home on postoperative day 1.  He is continue to do well.  He does have the typical periincisional numbness.  Otherwise is returned to his baseline  Current Outpatient Medications  Medication Sig Dispense Refill   albuterol (VENTOLIN HFA) 108 (90 Base) MCG/ACT inhaler Inhale 1-2 puffs into the lungs every 6 (six) hours as needed for wheezing or shortness of breath.     Alcohol Swabs (ALCOHOL WIPES) 70 % PADS Apply topically.     amitriptyline (ELAVIL) 25 MG tablet Take 25 mg by mouth at bedtime.     amLODipine (NORVASC) 10 MG tablet Take 1 tablet (10 mg total) by mouth daily. 30 tablet 5   aspirin 81 MG tablet Take 1 tablet (81 mg total) by mouth daily with breakfast. 30 tablet 1   cholecalciferol (VITAMIN D3) 25 MCG (1000 UNIT) tablet Take 1,000 Units by mouth every morning.     dorzolamide-timolol (COSOPT) 22.3-6.8 MG/ML ophthalmic solution Place 1 drop into the right eye in the morning.     ferrous sulfate 325 (65 FE) MG tablet Take 325 mg by mouth daily with breakfast.     finasteride (PROSCAR) 5 MG tablet Take 5 mg by mouth at bedtime.     fish oil-omega-3 fatty acids 1000 MG capsule Take 1 g by mouth 2 (two) times daily.     gabapentin (NEURONTIN) 100 MG capsule Take 200 mg by mouth 2 (two) times daily.      insulin glargine (LANTUS) 100 UNIT/ML injection Inject 28 Units into the skin in the morning and at bedtime.     losartan (COZAAR) 25 MG tablet Take 1 tablet (25 mg total) by mouth daily. 90 tablet 2   pravastatin (PRAVACHOL) 80 MG tablet Take 1 tablet (80 mg total) by mouth daily.  90 tablet 1   prazosin (MINIPRESS) 2 MG capsule Take 2 mg by mouth at bedtime.     torsemide (DEMADEX) 20 MG tablet Take 20 mg by mouth daily.     traZODone (DESYREL) 50 MG tablet Take 50 mg by mouth at bedtime.     nitroGLYCERIN (NITROSTAT) 0.4 MG SL tablet Place 1 tablet (0.4 mg total) under the tongue every 5 (five) minutes as needed for chest pain. 25 tablet 3   oxyCODONE-acetaminophen (PERCOCET) 5-325 MG tablet Take 1 tablet by mouth every 6 (six) hours as needed for severe pain. (Patient not taking: Reported on 01/27/2021) 8 tablet 0   No current facility-administered medications for this visit.     PHYSICAL EXAM: There were no vitals filed for this visit.  GENERAL: The patient is a well-nourished male, in no acute distress. The vital signs are documented above. Right neck incision is well-healed.  Carotid arteries without bruits bilaterally.  Neurologically intact.  MEDICAL ISSUES: Stable overall status post right carotid endarterectomy for asymptomatic disease on 12/08/2020.  He does have known moderate to severe left internal carotid artery stenosis by preoperative CT angiogram on 10/28/2020.  We will see him again in 9 months for repeat carotid duplex   Larina Earthly, MD FACS [facility] [phone]  Note: Portions of this report may have  been transcribed using voice recognition software.  Every effort has been made to ensure accuracy; however, inadvertent computerized transcription errors may still be present.

## 2021-02-05 ENCOUNTER — Other Ambulatory Visit: Payer: Self-pay

## 2021-02-05 DIAGNOSIS — Z9889 Other specified postprocedural states: Secondary | ICD-10-CM

## 2021-03-02 ENCOUNTER — Other Ambulatory Visit: Payer: Self-pay

## 2021-03-02 ENCOUNTER — Inpatient Hospital Stay (HOSPITAL_COMMUNITY): Payer: Medicare Other | Attending: Hematology

## 2021-03-02 DIAGNOSIS — F17210 Nicotine dependence, cigarettes, uncomplicated: Secondary | ICD-10-CM | POA: Insufficient documentation

## 2021-03-02 DIAGNOSIS — D509 Iron deficiency anemia, unspecified: Secondary | ICD-10-CM | POA: Diagnosis present

## 2021-03-02 DIAGNOSIS — D500 Iron deficiency anemia secondary to blood loss (chronic): Secondary | ICD-10-CM

## 2021-03-02 LAB — CBC WITH DIFFERENTIAL/PLATELET
Abs Immature Granulocytes: 0.02 10*3/uL (ref 0.00–0.07)
Basophils Absolute: 0 10*3/uL (ref 0.0–0.1)
Basophils Relative: 1 %
Eosinophils Absolute: 0.1 10*3/uL (ref 0.0–0.5)
Eosinophils Relative: 3 %
HCT: 32.9 % — ABNORMAL LOW (ref 39.0–52.0)
Hemoglobin: 10.6 g/dL — ABNORMAL LOW (ref 13.0–17.0)
Immature Granulocytes: 1 %
Lymphocytes Relative: 33 %
Lymphs Abs: 1.1 10*3/uL (ref 0.7–4.0)
MCH: 29.4 pg (ref 26.0–34.0)
MCHC: 32.2 g/dL (ref 30.0–36.0)
MCV: 91.4 fL (ref 80.0–100.0)
Monocytes Absolute: 0.2 10*3/uL (ref 0.1–1.0)
Monocytes Relative: 6 %
Neutro Abs: 1.9 10*3/uL (ref 1.7–7.7)
Neutrophils Relative %: 56 %
Platelets: 186 10*3/uL (ref 150–400)
RBC: 3.6 MIL/uL — ABNORMAL LOW (ref 4.22–5.81)
RDW: 15.9 % — ABNORMAL HIGH (ref 11.5–15.5)
WBC: 3.2 10*3/uL — ABNORMAL LOW (ref 4.0–10.5)
nRBC: 0 % (ref 0.0–0.2)

## 2021-03-02 LAB — IRON AND TIBC
Iron: 53 ug/dL (ref 45–182)
Saturation Ratios: 15 % — ABNORMAL LOW (ref 17.9–39.5)
TIBC: 344 ug/dL (ref 250–450)
UIBC: 291 ug/dL

## 2021-03-02 LAB — FERRITIN: Ferritin: 17 ng/mL — ABNORMAL LOW (ref 24–336)

## 2021-03-02 NOTE — Progress Notes (Signed)
Virtual Visit via Telephone Note [facility]  I connected with Kyle Daniel on 03/03/21 at 12:01 PM by telephone and verified that I am speaking with the correct person using two identifiers.  Location: Patient: Home Provider:  [facility]   I discussed the limitations, risks, security and privacy concerns of performing an evaluation and management service by telephone and the availability of in person appointments. I also discussed with the patient that there may be a patient responsible charge related to this service. The patient expressed understanding and agreed to proceed.   REASON FOR VISIT: Follow-up for iron deficiency anemia   CURRENT THERAPY: Intermittent IV Feraheme infusions (last given 01/08/2021 and 01/15/2021)   INTERVAL HISTORY:  Kyle Daniel 75 y.o. male is contacted today for routine follow-up of his iron deficiency anemia.  He was last seen in office by Rojelio Brenner PA-C on 01/05/2021.  At today's visit, he reports feeling fairly well.  He denies any infections, hospitalizations, or changes in his overall health status since his last appointment.  Iron deficiency anemia likely secondary to chronic slow GI bleed, without specific source of bleeding having been identified.  He continues to report ongoing melena with almost every bowel movement.  He has not followed up with gastroenterology since our last visit with him.  He reports that he felt slightly improved energy after his IV iron infusions in June.  He reports mild to moderate fatigue today, with energy at 60%.  He does have occasional chest pain, but denies any today.  He has chronic headaches.  No dizziness, dyspnea, syncope.  He has 60% energy and 80% appetite.  He endorses that he is maintaining a stable weight.    OBSERVATIONS/OBJECTIVE: Review of Systems  Constitutional:  Positive for malaise/fatigue (energy 60%). Negative for chills, diaphoresis, fever and weight loss.   Respiratory:  Negative for cough and shortness of breath.   Cardiovascular:  Positive for chest pain (none today). Negative for palpitations.  Gastrointestinal:  Negative for abdominal pain, blood in stool, melena, nausea and vomiting.  Neurological:  Positive for tingling and headaches. Negative for dizziness.  Psychiatric/Behavioral:  Positive for depression. The patient is nervous/anxious.     PHYSICAL EXAM (per limitations of virtual telephone visit): The patient is alert and oriented x 3, exhibiting adequate mentation, good mood, and ability to speak in full sentences and execute sound judgement.   ASSESSMENT & PLAN: 1.  Chronic iron deficiency anemia:  -Admitted to the hospital from 01/27/2020 through 01/29/2020 with dizziness and dyspnea on exertion. Found to have hemoglobin 5.6, received blood transfusions. -History of GI bleed in October 2020 at [facility] in [place], required 5 units of PRBC. -EGD on 01/28/2020 and colonoscopy on 01/29/2019 were nonrevealing. -Capsule study on 03/12/2020, stomach with several images of blood flecks or wisps of blood without obvious source.  Multiple images of duodenal, proximal jejunal areas with what appears lymphangiectasia's without active bleeding.  Further along in the study, images with possible erosions/ulcers. - Other nutritional deficiency work-up was negative.  SPEP was negative. - Feraheme infusion most recently on 01/08/2021 and 01/15/2021 - Most recent labs (03/02/2021): Hgb stable at 10.6 with MCV 91.4, ferritin 17, iron saturation 15% - Continues to report melena with almost every bowel movement - Symptomatic with mild to moderate fatigue - Has not followed up with GI since last visit in hematology clinic - PLAN: Recommend IV Feraheme x2.  Persistently low iron despite IV iron repletion suggests ongoing blood loss.  Continue close follow-up with repeat labs and office visit in 2 months.  Continue to recommend GI follow-up (instructed patient to  call Dr. Jena Gauss, who performed the patient's last EGD/colonoscopy).  Patient aware of alarm symptoms that would prompt immediate medical attention at the ED.  2.  Smoking history: - Reviewed results of CT chest from 05/26/2020 which was lung RADS 1. - PLAN: Continue annual imaging for lung cancer screening as indicated (next due November 2022)  3.  Social/family history: -1 and half pack per day current active smoker for 56 years. -2 sisters had breast cancer.   Follow Up Instructions: -Feraheme x2 - CBC and iron panel with RTC in 2 months    I discussed the assessment and treatment plan with the patient. The patient was provided an opportunity to ask questions and all were answered. The patient agreed with the plan and demonstrated an understanding of the instructions.   The patient was advised to call back or seek an in-person evaluation if the symptoms worsen or if the condition fails to improve as anticipated.  I provided 13 minutes of non-face-to-face time during this encounter.  Carnella Gu[place], PA-C 03/03/2021 12:14 PM

## 2021-03-03 ENCOUNTER — Inpatient Hospital Stay (HOSPITAL_BASED_OUTPATIENT_CLINIC_OR_DEPARTMENT_OTHER): Payer: Medicare Other | Admitting: Physician Assistant

## 2021-03-03 DIAGNOSIS — D500 Iron deficiency anemia secondary to blood loss (chronic): Secondary | ICD-10-CM | POA: Diagnosis not present

## 2021-03-09 ENCOUNTER — Inpatient Hospital Stay (HOSPITAL_COMMUNITY): Payer: Medicare Other

## 2021-03-09 ENCOUNTER — Other Ambulatory Visit: Payer: Self-pay

## 2021-03-09 ENCOUNTER — Encounter (HOSPITAL_COMMUNITY): Payer: Self-pay

## 2021-03-09 VITALS — BP 120/52 | HR 69 | Temp 96.8°F | Resp 17

## 2021-03-09 DIAGNOSIS — D509 Iron deficiency anemia, unspecified: Secondary | ICD-10-CM | POA: Diagnosis not present

## 2021-03-09 DIAGNOSIS — D500 Iron deficiency anemia secondary to blood loss (chronic): Secondary | ICD-10-CM

## 2021-03-09 DIAGNOSIS — K922 Gastrointestinal hemorrhage, unspecified: Secondary | ICD-10-CM

## 2021-03-09 MED ORDER — LORATADINE 10 MG PO TABS
10.0000 mg | ORAL_TABLET | Freq: Once | ORAL | Status: AC
  Administered 2021-03-09: 10 mg via ORAL
  Filled 2021-03-09: qty 1

## 2021-03-09 MED ORDER — SODIUM CHLORIDE 0.9 % IV SOLN: Freq: Once | INTRAVENOUS | Status: AC

## 2021-03-09 MED ORDER — ACETAMINOPHEN 325 MG PO TABS
650.0000 mg | ORAL_TABLET | Freq: Once | ORAL | Status: AC
  Administered 2021-03-09: 650 mg via ORAL
  Filled 2021-03-09: qty 2

## 2021-03-09 MED ORDER — SODIUM CHLORIDE 0.9 % IV SOLN
510.0000 mg | Freq: Once | INTRAVENOUS | Status: AC
  Administered 2021-03-09: 510 mg via INTRAVENOUS
  Filled 2021-03-09: qty 510

## 2021-03-09 NOTE — Patient Instructions (Signed)
[facility]  Discharge Instructions: Thank you for choosing [facility] to provide your oncology and hematology care.  If you have a lab appointment with the Cancer Center, please come in thru the Main Entrance and check in at the main information desk.  Wear comfortable clothing and clothing appropriate for easy access to any Portacath or PICC line.   We strive to give you quality time with your provider. You may need to reschedule your appointment if you arrive late (15 or more minutes).  Arriving late affects you and other patients whose appointments are after yours.  Also, if you miss three or more appointments without notifying the office, you may be dismissed from the clinic at the provider's discretion.      For prescription refill requests, have your pharmacy contact our office and allow 72 hours for refills to be completed.    Today you received the following : Feraheme .       To help prevent nausea and vomiting after your treatment, we encourage you to take your nausea medication as directed.  BELOW ARE SYMPTOMS THAT SHOULD BE REPORTED IMMEDIATELY: *FEVER GREATER THAN 100.4 F (38 C) OR HIGHER *CHILLS OR SWEATING *NAUSEA AND VOMITING THAT IS NOT CONTROLLED WITH YOUR NAUSEA MEDICATION *UNUSUAL SHORTNESS OF BREATH *UNUSUAL BRUISING OR BLEEDING *URINARY PROBLEMS (pain or burning when urinating, or frequent urination) *BOWEL PROBLEMS (unusual diarrhea, constipation, pain near the anus) TENDERNESS IN MOUTH AND THROAT WITH OR WITHOUT PRESENCE OF ULCERS (sore throat, sores in mouth, or a toothache) UNUSUAL RASH, SWELLING OR PAIN  UNUSUAL VAGINAL DISCHARGE OR ITCHING   Items with * indicate a potential emergency and should be followed up as soon as possible or go to the Emergency Department if any problems should occur.  Please show the CHEMOTHERAPY ALERT CARD or IMMUNOTHERAPY ALERT CARD at check-in to the Emergency Department and triage nurse.  Should you  have questions after your visit or need to cancel or reschedule your appointment, please contact [facility] [phone]  and follow the prompts.  Office hours are 8:00 a.m. to 4:30 p.m. Monday - Friday. Please note that voicemails left after 4:00 p.m. may not be returned until the following business day.  We are closed weekends and major holidays. You have access to a nurse at all times for urgent questions. Please call the main number to the clinic [phone] and follow the prompts.  For any non-urgent questions, you may also contact your provider using MyChart. We now offer e-Visits for anyone 18 and older to request care online for non-urgent symptoms. For details visit [email or web address].   Also download the MyChart app! Go to the app store, search "MyChart", open the app, select [place], and log in with your MyChart username and password.  Due to Covid, a mask is required upon entering the hospital/clinic. If you do not have a mask, one will be given to you upon arrival. For doctor visits, patients may have 1 support person aged 18 or older with them. For treatment visits, patients cannot have anyone with them due to current Covid guidelines and our immunocompromised population.  

## 2021-03-09 NOTE — Progress Notes (Signed)
Patient presents today for Feraheme IV iron infusion. Vital signs are stable. Patient denies any changes since the last iron infusion. Patient denies any complaints today. MAR reviewed and updated.    Feraheme given today per MD orders. Tolerated infusion without adverse affects. Vital signs stable. No complaints at this time. Patient requesting to leave before 30 minute weight time. Patient teaching performed. Understanding verbalized. Discharged from clinic ambulatory in stable condition. Alert and oriented x 3. F/U with [facility] as scheduled.

## 2021-03-16 ENCOUNTER — Other Ambulatory Visit: Payer: Self-pay

## 2021-03-16 ENCOUNTER — Encounter (HOSPITAL_COMMUNITY): Payer: Self-pay

## 2021-03-16 ENCOUNTER — Inpatient Hospital Stay (HOSPITAL_COMMUNITY): Payer: Medicare Other

## 2021-03-16 VITALS — BP 121/64 | HR 68 | Temp 97.0°F | Resp 18

## 2021-03-16 DIAGNOSIS — K922 Gastrointestinal hemorrhage, unspecified: Secondary | ICD-10-CM

## 2021-03-16 DIAGNOSIS — D500 Iron deficiency anemia secondary to blood loss (chronic): Secondary | ICD-10-CM

## 2021-03-16 DIAGNOSIS — D509 Iron deficiency anemia, unspecified: Secondary | ICD-10-CM | POA: Diagnosis not present

## 2021-03-16 MED ORDER — ACETAMINOPHEN 325 MG PO TABS
650.0000 mg | ORAL_TABLET | Freq: Once | ORAL | Status: AC
  Administered 2021-03-16: 650 mg via ORAL
  Filled 2021-03-16: qty 2

## 2021-03-16 MED ORDER — LORATADINE 10 MG PO TABS
10.0000 mg | ORAL_TABLET | Freq: Once | ORAL | Status: AC
  Administered 2021-03-16: 10 mg via ORAL
  Filled 2021-03-16: qty 1

## 2021-03-16 MED ORDER — SODIUM CHLORIDE 0.9 % IV SOLN: Freq: Once | INTRAVENOUS | Status: AC

## 2021-03-16 MED ORDER — SODIUM CHLORIDE 0.9 % IV SOLN
510.0000 mg | Freq: Once | INTRAVENOUS | Status: AC
  Administered 2021-03-16: 510 mg via INTRAVENOUS
  Filled 2021-03-16: qty 510

## 2021-03-16 NOTE — Patient Instructions (Signed)
[facility]  Discharge Instructions: Thank you for choosing [facility] to provide your oncology and hematology care.  If you have a lab appointment with the Cancer Center, please come in thru the Main Entrance and check in at the main information desk.  Wear comfortable clothing and clothing appropriate for easy access to any Portacath or PICC line.   We strive to give you quality time with your provider. You may need to reschedule your appointment if you arrive late (15 or more minutes).  Arriving late affects you and other patients whose appointments are after yours.  Also, if you miss three or more appointments without notifying the office, you may be dismissed from the clinic at the provider's discretion.      For prescription refill requests, have your pharmacy contact our office and allow 72 hours for refills to be completed.    Today you received the following : Feraheme .       To help prevent nausea and vomiting after your treatment, we encourage you to take your nausea medication as directed.  BELOW ARE SYMPTOMS THAT SHOULD BE REPORTED IMMEDIATELY: *FEVER GREATER THAN 100.4 F (38 C) OR HIGHER *CHILLS OR SWEATING *NAUSEA AND VOMITING THAT IS NOT CONTROLLED WITH YOUR NAUSEA MEDICATION *UNUSUAL SHORTNESS OF BREATH *UNUSUAL BRUISING OR BLEEDING *URINARY PROBLEMS (pain or burning when urinating, or frequent urination) *BOWEL PROBLEMS (unusual diarrhea, constipation, pain near the anus) TENDERNESS IN MOUTH AND THROAT WITH OR WITHOUT PRESENCE OF ULCERS (sore throat, sores in mouth, or a toothache) UNUSUAL RASH, SWELLING OR PAIN  UNUSUAL VAGINAL DISCHARGE OR ITCHING   Items with * indicate a potential emergency and should be followed up as soon as possible or go to the Emergency Department if any problems should occur.  Please show the CHEMOTHERAPY ALERT CARD or IMMUNOTHERAPY ALERT CARD at check-in to the Emergency Department and triage nurse.  Should you  have questions after your visit or need to cancel or reschedule your appointment, please contact [facility] [phone]  and follow the prompts.  Office hours are 8:00 a.m. to 4:30 p.m. Monday - Friday. Please note that voicemails left after 4:00 p.m. may not be returned until the following business day.  We are closed weekends and major holidays. You have access to a nurse at all times for urgent questions. Please call the main number to the clinic [phone] and follow the prompts.  For any non-urgent questions, you may also contact your provider using MyChart. We now offer e-Visits for anyone 18 and older to request care online for non-urgent symptoms. For details visit [email or web address].   Also download the MyChart app! Go to the app store, search "MyChart", open the app, select [place], and log in with your MyChart username and password.  Due to Covid, a mask is required upon entering the hospital/clinic. If you do not have a mask, one will be given to you upon arrival. For doctor visits, patients may have 1 support person aged 18 or older with them. For treatment visits, patients cannot have anyone with them due to current Covid guidelines and our immunocompromised population.  

## 2021-03-16 NOTE — Progress Notes (Signed)
Patient presents today for Feraheme IV iron infusion. Vital signs are stable. Patient denies any changes since the last iron infusion. Patient denies any complaints today. MAR reviewed and updated.    Feraheme given today per MD orders. Tolerated infusion without adverse affects. Vital signs stable. No complaints at this time. Discharged from clinic ambulatory in stable condition. Alert and oriented x 3. F/U with [facility] as scheduled.   

## 2021-03-26 ENCOUNTER — Ambulatory Visit: Payer: Medicare Other | Admitting: Cardiology

## 2021-03-26 ENCOUNTER — Encounter: Payer: Self-pay | Admitting: Cardiology

## 2021-03-26 VITALS — BP 120/70 | HR 76 | Ht 73.0 in | Wt 262.6 lb

## 2021-03-26 DIAGNOSIS — I779 Disorder of arteries and arterioles, unspecified: Secondary | ICD-10-CM

## 2021-03-26 DIAGNOSIS — I10 Essential (primary) hypertension: Secondary | ICD-10-CM

## 2021-03-26 DIAGNOSIS — E782 Mixed hyperlipidemia: Secondary | ICD-10-CM | POA: Diagnosis not present

## 2021-03-26 DIAGNOSIS — I2510 Atherosclerotic heart disease of native coronary artery without angina pectoris: Secondary | ICD-10-CM | POA: Diagnosis not present

## 2021-03-26 NOTE — Progress Notes (Signed)
Clinical Summary [name] is a 75 y.o.male seen today for follow up of the following medical problems.    1. CAD - history of CABG - underwent three-vessel coronary artery bypass graft surgery on 12/18/2002 with a LIMA to the LAD, left radial to the ramus intermedius, and SVG to circumflex marginal Jakyia Gaccione.        - sharp chest pain once every 4-5 months, nonspecific - no SOB/DOE - compliant with meds     2. HTN - compliant with meds       3. Hyperlipidemia - compliant with meds  - labs followed by pcp  5/22022 TC 102 TG 44 HDL 36 LDL 57   4. Cartoid stenosis. - 05/2019 RICA <50%, LICA 50-69%  - no symptoms   09/2020 carotid US: 50-69% disease bilaerally. Consider CTA given limited visualization 10/2020 CTA: 70-80% RICA, 50% LICA - 12/08/20 RICA CEA with Dr Arbie Cookey   5. Anemia, heme + stool - July 2021 Hgb 5.6 on admission. colonoscopy and EGD during admission without obvious source for bleeding - followed by GI and hematology   SH: former Hotel manager, Clinical cytogeneticist.    Past Medical History:  Diagnosis Date   Anemia    ASCVD (arteriosclerotic cardiovascular disease)    S/P CABG surgery  in 2004; cardiac cath in 2005 revealed patent grafts.   BPH (benign prostatic hypertrophy)    Carotid artery occlusion    Cerebrovascular disease    bilateral bruits   Coronary artery disease    DM (diabetes mellitus) (HCC)    AODM-- insulin requiring for the past 40yrs   Hx of adenomatous colonic polyps    Hyperlipidemia    Hypertension    Pneumonia 2020   Tobacco abuse    50 pack years; current 1 pack per day     Allergies  Allergen Reactions   Lisinopril Cough     Current Outpatient Medications  Medication Sig Dispense Refill   albuterol (VENTOLIN HFA) 108 (90 Base) MCG/ACT inhaler Inhale 1-2 puffs into the lungs every 6 (six) hours as needed for wheezing or shortness of breath.     Alcohol Swabs (ALCOHOL WIPES) 70 % PADS Apply topically.     amitriptyline (ELAVIL)  25 MG tablet Take 25 mg by mouth at bedtime.     amLODipine (NORVASC) 10 MG tablet Take 1 tablet (10 mg total) by mouth daily. 30 tablet 5   aspirin 81 MG tablet Take 1 tablet (81 mg total) by mouth daily with breakfast. 30 tablet 1   cholecalciferol (VITAMIN D3) 25 MCG (1000 UNIT) tablet Take 1,000 Units by mouth every morning.     dorzolamide-timolol (COSOPT) 22.3-6.8 MG/ML ophthalmic solution Place 1 drop into the right eye in the morning.     ferrous sulfate 325 (65 FE) MG tablet Take 325 mg by mouth daily with breakfast.     finasteride (PROSCAR) 5 MG tablet Take 5 mg by mouth at bedtime.     gabapentin (NEURONTIN) 100 MG capsule Take 200 mg by mouth 2 (two) times daily.      insulin glargine (LANTUS) 100 UNIT/ML injection Inject 28 Units into the skin in the morning and at bedtime.     losartan (COZAAR) 25 MG tablet Take 1 tablet (25 mg total) by mouth daily. 90 tablet 2   nitroGLYCERIN (NITROSTAT) 0.4 MG SL tablet Place 1 tablet (0.4 mg total) under the tongue every 5 (five) minutes as needed for chest pain. 25 tablet 3   Omega-3 Fatty  Acids (FISH OIL) 1000 MG CAPS Take 3 capsules by mouth 2 (two) times daily before a meal.     oxyCODONE-acetaminophen (PERCOCET) 5-325 MG tablet Take 1 tablet by mouth every 6 (six) hours as needed for severe pain. 8 tablet 0   pioglitazone (ACTOS) 45 MG tablet TAKE ONE TABLET BY MOUTH EVERY DAY FOR DIABETES MELLITUS     pravastatin (PRAVACHOL) 80 MG tablet Take 1 tablet (80 mg total) by mouth daily. 90 tablet 1   prazosin (MINIPRESS) 2 MG capsule Take 2 mg by mouth at bedtime.     torsemide (DEMADEX) 20 MG tablet Take 1 tablet by mouth daily.     traZODone (DESYREL) 50 MG tablet Take 50 mg by mouth at bedtime.     No current facility-administered medications for this visit.     Past Surgical History:  Procedure Laterality Date   BIOPSY  01/28/2020   Procedure: BIOPSY;  Surgeon: Corbin Ade, MD;  Location: [facility];  Service: Endoscopy;;    CERVICAL DISCECTOMY  1996   anterior cervical spine discectomy and fusion   CHOLECYSTECTOMY  06/17/2003   by Lebron Conners MD   COLONOSCOPY W/ POLYPECTOMY     4 adenomatous polyps   COLONOSCOPY WITH PROPOFOL N/A 01/29/2020   normal colon, normal appearing TI.    CORONARY ARTERY BYPASS GRAFT     ENDARTERECTOMY Right 12/08/2020   Procedure: RIGHT CAROTID ENDARTERECTOMY;  Surgeon: Larina Earthly, MD;  Location: [facility] OR;  Service: Vascular;  Laterality: Right;   ESOPHAGOGASTRODUODENOSCOPY  07/04/2007   Dr. Darrick Penna; normal esophagus, erythema/edema in gastric antrum s/p biopsied, duodenitis.  Pathology positive for H. pylori.   ESOPHAGOGASTRODUODENOSCOPY (EGD) WITH PROPOFOL N/A 01/28/2020    mild erosive reflux esophagitis, s/p biopsy of gastric mucosa, normal duodenum overall except for minimally eroded second portion of mucosa s/p biopsy. Pathology: slight chronic gastritis, negative H.pylori, benign duodenal mucosa, no sprue.    GIVENS CAPSULE STUDY N/A 02/26/2020   Procedure: GIVENS CAPSULE STUDY;  Surgeon: Lanelle Bal, DO;  Location: [facility];  Service: Endoscopy;  Laterality: N/A;  7:30am   GIVENS CAPSULE STUDY N/A 03/12/2020   Procedure: GIVENS CAPSULE STUDY;  Surgeon: Corbin Ade, MD;  Location: [facility];  Service: Endoscopy;  Laterality: N/A;  7:30am, pt to arrive early to receive meds 30 mins prior to capsule   PATCH ANGIOPLASTY Right 12/08/2020   Procedure: PATCH ANGIOPLASTY HEMASHIELD TO RIGHT INTERNAL CAROTID ARTERY;  Surgeon: Larina Earthly, MD;  Location: [facility] OR;  Service: Vascular;  Laterality: Right;     Allergies  Allergen Reactions   Lisinopril Cough      Family History  Problem Relation Age of Onset   Pneumonia Mother 94       complications of diabetes   Diabetes Mother    Pneumonia Father 34       complication of pneumonia   Cancer Sister        breast   Cancer Sister        breast cancer   Colon cancer Neg Hx      Social History Mr. Noyce reports  that he has been smoking cigarettes. He started smoking about 60 years ago. He has a 75.00 pack-year smoking history. He has never used smokeless tobacco. Mr. Simmers reports no history of alcohol use.   Review of Systems CONSTITUTIONAL: No weight loss, fever, chills, weakness or fatigue.  HEENT: Eyes: No visual loss, blurred vision, double vision or yellow sclerae.No hearing  loss, sneezing, congestion, runny nose or sore throat.  SKIN: No rash or itching.  CARDIOVASCULAR: per hpi RESPIRATORY: No shortness of breath, cough or sputum.  GASTROINTESTINAL: No anorexia, nausea, vomiting or diarrhea. No abdominal pain or blood.  GENITOURINARY: No burning on urination, no polyuria NEUROLOGICAL: No headache, dizziness, syncope, paralysis, ataxia, numbness or tingling in the extremities. No change in bowel or bladder control.  MUSCULOSKELETAL: No muscle, back pain, joint pain or stiffness.  LYMPHATICS: No enlarged nodes. No history of splenectomy.  PSYCHIATRIC: No history of depression or anxiety.  ENDOCRINOLOGIC: No reports of sweating, cold or heat intolerance. No polyuria or polydipsia.  Marland Kitchen   Physical Examination Today's Vitals   03/26/21 1411  BP: 120/70  Pulse: 76  SpO2: 96%  Weight: 262 lb 9.6 oz (119.1 kg)  Height: 6\' 1"  (1.854 m)   Body mass index is 34.65 kg/m.  Gen: resting comfortably, no acute distress HEENT: no scleral icterus, pupils equal round and reactive, no palptable cervical adenopathy,  CV: RRR, no mr/g, no jvd Resp: Clear to auscultation bilaterally GI: abdomen is soft, non-tender, non-distended, normal bowel sounds, no hepatosplenomegaly MSK: extremities are warm, no edema.  Skin: warm, no rash Neuro:  no focal deficits Psych: appropriate affect   Diagnostic Studies Echocardiogram 05/31/2019:   1. Left ventricular ejection fraction, by visual estimation, is 60 to  65%. The left ventricle has normal function. There is moderately increased  left ventricular  hypertrophy.   2. Left ventricular diastolic parameters are consistent with Grade II  diastolic dysfunction (pseudonormalization).   3. Global right ventricle has normal systolic function.The right  ventricular size is normal. No increase in right ventricular wall  thickness.   4. Left atrial size was mildly dilated.   5. Right atrial size was normal.   6. Presence of pericardial fat pad.   7. Mild aortic valve annular calcification.   8. Mild to moderate mitral annular calcification.   9. The mitral valve is grossly normal. Trace mitral valve regurgitation.  10. The tricuspid valve is grossly normal. Tricuspid valve regurgitation  is trivial.  11. The aortic valve is tricuspid. Aortic valve regurgitation is not  visualized.  12. The pulmonic valve was grossly normal. Pulmonic valve regurgitation is  trivial.  13. TR signal is inadequate for assessing pulmonary artery systolic  pressure.  14. The inferior vena cava is normal in size with greater than 50%  respiratory variability, suggesting right atrial pressure of 3 mmHg.   09/2020 carotid 10/2020 IMPRESSION: At least 50-69% stenosis of the internal carotid arteries bilaterally. There is long segment shadowing plaque within the distal common and proximal internal carotid arteries bilaterally which may be obscuring regions of higher velocity/stenosis. Further evaluation with CT angiography of the neck should be considered to more definitively characterize degree of stenosis.   These results will be called to the ordering clinician or representative by the Radiologist Assistant, and communication documented in the PACS or [place].    Assessment and Plan  1. CAD - no symptoms, continue current meds EKG today NSR, no ischemic changes     2. HTN - his bp is at goal, continue current meds     3. Hyperlipidemia -has been at goal, contniue pravatatin       4. Carotid stenosis  Continue to follow with vascular,  continue medical therapy.    F/u 6 months  [employer], M.D

## 2021-03-26 NOTE — Patient Instructions (Signed)
Medication Instructions:  Your physician recommends that you continue on your current medications as directed. Please refer to the Current Medication list given to you today.  *If you need a refill on your cardiac medications before your next appointment, please call your pharmacy*   Lab Work: None today If you have labs (blood work) drawn today and your tests are completely normal, you will receive your results only by: MyChart Message (if you have MyChart) OR A paper copy in the mail If you have any lab test that is abnormal or we need to change your treatment, we will call you to review the results.   Testing/Procedures: None today   Follow-Up: At [facility], you and your health needs are our priority.  As part of our continuing mission to provide you with exceptional heart care, we have created designated Provider Care Teams.  These Care Teams include your primary Cardiologist (physician) and Advanced Practice Providers (APPs -  Physician Assistants and Nurse Practitioners) who all work together to provide you with the care you need, when you need it.  We recommend signing up for the patient portal called "MyChart".  Sign up information is provided on this After Visit Summary.  MyChart is used to connect with patients for Virtual Visits (Telemedicine).  Patients are able to view lab/test results, encounter notes, upcoming appointments, etc.  Non-urgent messages can be sent to your provider as well.   To learn more about what you can do with MyChart, go to [email or web address].    Your next appointment:   6 month(s)  The format for your next appointment:   In Person  Provider:   Jonathan Branch, MD   Other Instructions None     

## 2021-05-10 NOTE — Progress Notes (Signed)
[facility] [address], [place] [address]   CLINIC:  Medical Oncology/Hematology  PCP:  Pearson Grippe, MD [address] [place] [address] [phone]   REASON FOR VISIT:  Follow-up for iron deficiency anemia  PRIOR THERAPY: PRBC transfusions  CURRENT THERAPY: Intermittent IV iron (most recent Feraheme on 03/09/2021 & 03/16/2021)  INTERVAL HISTORY:  [name] 75 y.o. male returns for routine follow-up of his iron deficiency anemia.  He was last evaluated via telemedicine visit by Rojelio Brenner PA-C on 03/03/2021.  At today's visit, he reports feeling well.  No recent hospitalizations, surgeries, or changes in baseline health status.  He reports that he continues to have black tarry stool with most bowel movements.  No hematochezia or hematemesis.  He continues to follow-up with gastroenterology.  He is taking oral iron supplement and B12 supplement at home, but states that his iron was black and tarry even before he started taking iron.  He continues to have chronic fatigue and occasional chest pain (he sees cardiologist) as well as mild dyspnea on exertion.  He denies any pica, restless leg, lightheadedness, or syncope.  He has 75% energy and 100% appetite. He endorses that he is maintaining a stable weight.    REVIEW OF SYSTEMS:  Review of Systems  Constitutional:  Positive for fatigue (Mild, but improved). Negative for appetite change, chills, diaphoresis, fever and unexpected weight change.  HENT:   Negative for lump/mass and nosebleeds.   Eyes:  Negative for eye problems.  Respiratory:  Negative for cough, hemoptysis and shortness of breath.   Cardiovascular:  Positive for chest pain (None today). Negative for leg swelling and palpitations.  Gastrointestinal:  Negative for abdominal pain, blood in stool, constipation, diarrhea, nausea and vomiting.  Genitourinary:  Negative for hematuria.   Skin: Negative.   Neurological:   Positive for headaches and numbness (Feet). Negative for dizziness and light-headedness.  Hematological:  Does not bruise/bleed easily.  Psychiatric/Behavioral:  Positive for depression. The patient is nervous/anxious.      PAST MEDICAL/SURGICAL HISTORY:  Past Medical History:  Diagnosis Date   Anemia    ASCVD (arteriosclerotic cardiovascular disease)    S/P CABG surgery  in 2004; cardiac cath in 2005 revealed patent grafts.   BPH (benign prostatic hypertrophy)    Carotid artery occlusion    Cerebrovascular disease    bilateral bruits   Coronary artery disease    DM (diabetes mellitus) (HCC)    AODM-- insulin requiring for the past 25yrs   Hx of adenomatous colonic polyps    Hyperlipidemia    Hypertension    Pneumonia 2020   Tobacco abuse    50 pack years; current 1 pack per day   Past Surgical History:  Procedure Laterality Date   BIOPSY  01/28/2020   Procedure: BIOPSY;  Surgeon: Corbin Ade, MD;  [facility];  Service: Endoscopy;;   CERVICAL DISCECTOMY  1996   anterior cervical spine discectomy and fusion   CHOLECYSTECTOMY  06/17/2003   by Lebron Conners MD   COLONOSCOPY W/ POLYPECTOMY     4 adenomatous polyps   COLONOSCOPY WITH PROPOFOL N/A 01/29/2020   normal colon, normal appearing TI.    CORONARY ARTERY BYPASS GRAFT     ENDARTERECTOMY Right 12/08/2020   Procedure: RIGHT CAROTID ENDARTERECTOMY;  Surgeon: Larina Earthly, MD;  Location: [facility] OR;  Service: Vascular;  Laterality: Right;   ESOPHAGOGASTRODUODENOSCOPY  07/04/2007   Dr. Darrick Penna; normal esophagus, erythema/edema in  gastric antrum s/p biopsied, duodenitis.  Pathology positive for H. pylori.   ESOPHAGOGASTRODUODENOSCOPY (EGD) WITH PROPOFOL N/A 01/28/2020    mild erosive reflux esophagitis, s/p biopsy of gastric mucosa, normal duodenum overall except for minimally eroded second portion of mucosa s/p biopsy. Pathology: slight chronic gastritis, negative H.pylori, benign duodenal mucosa, no sprue.    GIVENS  CAPSULE STUDY N/A 02/26/2020   Procedure: GIVENS CAPSULE STUDY;  Surgeon: Lanelle Bal, DO;  Location: [facility];  Service: Endoscopy;  Laterality: N/A;  7:30am   GIVENS CAPSULE STUDY N/A 03/12/2020   Procedure: GIVENS CAPSULE STUDY;  Surgeon: Corbin Ade, MD;  [facility]: [facility];  Service: Endoscopy;  Laterality: N/A;  7:30am, pt to arrive early to receive meds 30 mins prior to capsule   PATCH ANGIOPLASTY Right 12/08/2020   Procedure: PATCH ANGIOPLASTY HEMASHIELD TO RIGHT INTERNAL CAROTID ARTERY;  Surgeon: Larina Earthly, MD;  Location: [facility] OR;  Service: Vascular;  Laterality: Right;     SOCIAL HISTORY:  Social History   Socioeconomic History   Marital status: Married    Spouse name: valeria   Number of children: 3   Years of education: Not on file   Highest education level: Not on file  Occupational History   Occupation: disabled  Tobacco Use   Smoking status: Every Day    Packs/day: 1.50    Years: 50.00    Pack years: 75.00    Types: Cigarettes    Start date: 09/05/1960   Smokeless tobacco: Never  Vaping Use   Vaping Use: Never used  Substance and Sexual Activity   Alcohol use: No    Comment: quit 1980's   Drug use: No   Sexual activity: Not on file  Other Topics Concern   Not on file  Social History Narrative   Not on file   Social Determinants of Health   Financial Resource Strain: Low Risk    Difficulty of Paying Living Expenses: Not hard at all  Food Insecurity: No Food Insecurity   Worried About Programme researcher, broadcasting/film/video in the Last Year: Never true   Ran Out of Food in the Last Year: Never true  Transportation Needs: No Transportation Needs   Lack of Transportation (Medical): No   Lack of Transportation (Non-Medical): No  Physical Activity: Inactive   Days of Exercise per Week: 0 days   Minutes of Exercise per Session: 0 min  Stress: No Stress Concern Present   Feeling of Stress : Only a little  Social Connections: Press photographer  of Communication with Friends and Family: More than three times a week   Frequency of Social Gatherings with Friends and Family: More than three times a week   Attends Religious Services: 1 to 4 times per year   Active Member of [employer] or Organizations: Yes   Attends Engineer, structural: More than 4 times per year   Marital Status: Married  Catering manager Violence: Not At Risk   Fear of Current or Ex-Partner: No   Emotionally Abused: No   Physically Abused: No   Sexually Abused: No    FAMILY HISTORY:  Family History  Problem Relation Age of Onset   Pneumonia Mother 36       complications of diabetes   Diabetes Mother    Pneumonia Father 35       complication of pneumonia   Cancer Sister        breast   Cancer Sister  breast cancer   Colon cancer Neg Hx     CURRENT MEDICATIONS:  Outpatient Encounter Medications as of 05/11/2021  Medication Sig Note   albuterol (VENTOLIN HFA) 108 (90 Base) MCG/ACT inhaler Inhale 1-2 puffs into the lungs every 6 (six) hours as needed for wheezing or shortness of breath.    Alcohol Swabs (ALCOHOL WIPES) 70 % PADS Apply topically.    amitriptyline (ELAVIL) 25 MG tablet Take 25 mg by mouth at bedtime.    amLODipine (NORVASC) 10 MG tablet Take 1 tablet (10 mg total) by mouth daily.    aspirin 81 MG tablet Take 1 tablet (81 mg total) by mouth daily with breakfast.    cholecalciferol (VITAMIN D3) 25 MCG (1000 UNIT) tablet Take 1,000 Units by mouth every morning.    dorzolamide-timolol (COSOPT) 22.3-6.8 MG/ML ophthalmic solution Place 1 drop into the right eye in the morning.    ferrous sulfate 325 (65 FE) MG tablet Take 325 mg by mouth daily with breakfast.    finasteride (PROSCAR) 5 MG tablet Take 5 mg by mouth at bedtime.    gabapentin (NEURONTIN) 100 MG capsule Take 200 mg by mouth 2 (two) times daily.     insulin glargine (LANTUS) 100 UNIT/ML injection Inject 28 Units into the skin in the morning and at bedtime. 12/08/2020: Took 14  units   losartan (COZAAR) 25 MG tablet Take 1 tablet (25 mg total) by mouth daily.    nitroGLYCERIN (NITROSTAT) 0.4 MG SL tablet Place 1 tablet (0.4 mg total) under the tongue every 5 (five) minutes as needed for chest pain.    Omega-3 Fatty Acids (FISH OIL) 1000 MG CAPS Take 3 capsules by mouth 2 (two) times daily before a meal.    oxyCODONE-acetaminophen (PERCOCET) 5-325 MG tablet Take 1 tablet by mouth every 6 (six) hours as needed for severe pain.    pioglitazone (ACTOS) 45 MG tablet TAKE ONE TABLET BY MOUTH EVERY DAY FOR DIABETES MELLITUS    pravastatin (PRAVACHOL) 80 MG tablet Take 1 tablet (80 mg total) by mouth daily.    prazosin (MINIPRESS) 2 MG capsule Take 2 mg by mouth at bedtime.    torsemide (DEMADEX) 20 MG tablet Take 1 tablet by mouth daily.    traZODone (DESYREL) 50 MG tablet Take 50 mg by mouth at bedtime.    No facility-administered encounter medications on file as of 05/11/2021.    ALLERGIES:  Allergies  Allergen Reactions   Lisinopril Cough     PHYSICAL EXAM:  ECOG PERFORMANCE STATUS: 1 - Symptomatic but completely ambulatory  There were no vitals filed for this visit. There were no vitals filed for this visit. Physical Exam Constitutional:      Appearance: Normal appearance. He is obese.  HENT:     Head: Normocephalic and atraumatic.     Mouth/Throat:     Mouth: Mucous membranes are moist.  Eyes:     Extraocular Movements: Extraocular movements intact.     Pupils: Pupils are equal, round, and reactive to light.  Cardiovascular:     Rate and Rhythm: Normal rate and regular rhythm.     Pulses: Normal pulses.     Heart sounds: Normal heart sounds.  Pulmonary:     Effort: Pulmonary effort is normal.     Breath sounds: Normal breath sounds.  Abdominal:     General: Bowel sounds are normal.     Palpations: Abdomen is soft.     Tenderness: There is no abdominal tenderness.  Musculoskeletal:  General: No swelling.     Right lower leg: No edema.      Left lower leg: No edema.  Lymphadenopathy:     Cervical: No cervical adenopathy.  Skin:    General: Skin is warm and dry.  Neurological:     General: No focal deficit present.     Mental Status: He is alert and oriented to person, place, and time.  Psychiatric:        Mood and Affect: Mood normal.        Behavior: Behavior normal.     LABORATORY DATA:  I have reviewed the labs as listed.  CBC    Component Value Date/Time   WBC 3.2 (L) 03/02/2021 0908   RBC 3.60 (L) 03/02/2021 0908   HGB 10.6 (L) 03/02/2021 0908   HCT 32.9 (L) 03/02/2021 0908   PLT 186 03/02/2021 0908   MCV 91.4 03/02/2021 0908   MCH 29.4 03/02/2021 0908   MCHC 32.2 03/02/2021 0908   RDW 15.9 (H) 03/02/2021 0908   LYMPHSABS 1.1 03/02/2021 0908   MONOABS 0.2 03/02/2021 0908   EOSABS 0.1 03/02/2021 0908   BASOSABS 0.0 03/02/2021 0908   CMP Latest Ref Rng & Units 12/09/2020 12/02/2020 10/28/2020  Glucose 70 - 99 mg/dL 765(Y) 650(P) -  BUN 8 - 23 mg/dL 15 10 -  Creatinine 5.46 - 1.24 mg/dL 5.68(L) 2.75(T) 7.00(F)  Sodium 135 - 145 mmol/L 136 139 -  Potassium 3.5 - 5.1 mmol/L 3.8 3.5 -  Chloride 98 - 111 mmol/L 107 107 -  CO2 22 - 32 mmol/L 23 26 -  Calcium 8.9 - 10.3 mg/dL 8.0(L) 8.7(L) -  Total Protein 6.5 - 8.1 g/dL - 6.8 -  Total Bilirubin 0.3 - 1.2 mg/dL - 7.4(B) -  Alkaline Phos 38 - 126 U/L - 41 -  AST 15 - 41 U/L - 16 -  ALT 0 - 44 U/L - 19 -    DIAGNOSTIC IMAGING:  I have independently reviewed the relevant imaging and discussed with the patient.  ASSESSMENT & PLAN: 1.  Chronic iron deficiency anemia:  - Admitted to the hospital from 01/27/2020 through 01/29/2020 with dizziness and dyspnea on exertion. Found to have hemoglobin 5.6, received blood transfusions. - History of GI bleed in October 2020 at [facility] in [place], required 5 units of PRBC. - EGD on 01/28/2020 and colonoscopy on 01/29/2019 were nonrevealing. - Capsule study on 03/12/2020, stomach with several images of blood flecks or wisps of  blood without obvious source.  Multiple images of duodenal, proximal jejunal areas with what appears lymphangiectasia's without active bleeding.  Further along in the study, images with possible erosions/ulcers. - Other nutritional deficiency work-up was negative.  SPEP was negative. - Feraheme infusion most recently on 03/09/2021 & 03/16/2021 - Most recent labs (05/11/2021): CBC shows Hgb 12.0; ferritin 25 with iron saturation 20% - Continues to report melena with most bowel movements - Symptomatic with mild to moderate fatigue - He is taking B12 and iron supplements at home (stools were dark even before starting iron) - PLAN: Recommend IV Feraheme x2.  We will repeat CBC/iron panel in 3 months with follow-up visit at that time. - Continue to follow with gastroenterologists. - Patient aware of alarm symptoms that would prompt immediate medical attention at the ED.   2.  Smoking history: - Reviewed results of CT chest from 05/26/2020 which was lung RADS 1. - PLAN: Continue annual imaging for lung cancer screening as indicated (next due November 2022)  3.  Social/family history: - Active smoker for 56 years, 1.5 PPD current smoker - Sisters x2 with breast cancer  PLAN SUMMARY & DISPOSITION: - IV Feraheme x2 - Repeat labs and RTC in 3 months (phone visit)   All questions were answered. The patient knows to call the clinic with any problems, questions or concerns.  Medical decision making: Moderate  Time spent on visit: I spent 20 minutes counseling the patient face to face. The total time spent in the appointment was 30 minutes and more than 50% was on counseling.   Carnella Guadalajara, PA-C  05/11/2021 4:33 PM

## 2021-05-11 ENCOUNTER — Inpatient Hospital Stay (HOSPITAL_COMMUNITY): Payer: Medicare Other | Attending: Physician Assistant | Admitting: Physician Assistant

## 2021-05-11 ENCOUNTER — Other Ambulatory Visit (HOSPITAL_COMMUNITY): Payer: Medicare Other

## 2021-05-11 ENCOUNTER — Ambulatory Visit (HOSPITAL_COMMUNITY): Payer: Medicare Other | Admitting: Physician Assistant

## 2021-05-11 ENCOUNTER — Other Ambulatory Visit: Payer: Self-pay

## 2021-05-11 ENCOUNTER — Inpatient Hospital Stay (HOSPITAL_COMMUNITY): Payer: Medicare Other

## 2021-05-11 VITALS — BP 136/68 | HR 78 | Temp 97.0°F | Resp 20 | Wt 264.3 lb

## 2021-05-11 DIAGNOSIS — D500 Iron deficiency anemia secondary to blood loss (chronic): Secondary | ICD-10-CM | POA: Diagnosis not present

## 2021-05-11 DIAGNOSIS — D509 Iron deficiency anemia, unspecified: Secondary | ICD-10-CM | POA: Diagnosis not present

## 2021-05-11 LAB — CBC WITH DIFFERENTIAL/PLATELET
Abs Immature Granulocytes: 0.04 10*3/uL (ref 0.00–0.07)
Basophils Absolute: 0 10*3/uL (ref 0.0–0.1)
Basophils Relative: 0 %
Eosinophils Absolute: 0.1 10*3/uL (ref 0.0–0.5)
Eosinophils Relative: 2 %
HCT: 36.8 % — ABNORMAL LOW (ref 39.0–52.0)
Hemoglobin: 12 g/dL — ABNORMAL LOW (ref 13.0–17.0)
Immature Granulocytes: 1 %
Lymphocytes Relative: 31 %
Lymphs Abs: 1.4 10*3/uL (ref 0.7–4.0)
MCH: 29.9 pg (ref 26.0–34.0)
MCHC: 32.6 g/dL (ref 30.0–36.0)
MCV: 91.8 fL (ref 80.0–100.0)
Monocytes Absolute: 0.3 10*3/uL (ref 0.1–1.0)
Monocytes Relative: 6 %
Neutro Abs: 2.8 10*3/uL (ref 1.7–7.7)
Neutrophils Relative %: 60 %
Platelets: 216 10*3/uL (ref 150–400)
RBC: 4.01 MIL/uL — ABNORMAL LOW (ref 4.22–5.81)
RDW: 14.2 % (ref 11.5–15.5)
WBC: 4.6 10*3/uL (ref 4.0–10.5)
nRBC: 0 % (ref 0.0–0.2)

## 2021-05-11 LAB — IRON AND TIBC
Iron: 70 ug/dL (ref 45–182)
Saturation Ratios: 20 % (ref 17.9–39.5)
TIBC: 358 ug/dL (ref 250–450)
UIBC: 288 ug/dL

## 2021-05-11 LAB — FERRITIN: Ferritin: 25 ng/mL (ref 24–336)

## 2021-05-11 NOTE — Patient Instructions (Signed)
[facility] Discharge Instructions  You were seen today by Rojelio Brenner PA-C for your iron deficiency anemia.  Your hemoglobin and iron levels are slightly improved from what they have been, but you will still need some additional IV iron to help maintain a healthy level of blood and iron in your body.  LABS: Return in 3 months for repeat labs  OTHER TESTS: No other tests at this time  MEDICATIONS: - Continue B12 and iron supplements at home - We will schedule you for IV iron (Feraheme) x2 doses  FOLLOW-UP APPOINTMENT: Phone visit in 3 months, after labs   Thank you for choosing [facility] at [facility] to provide your oncology and hematology care.  To afford each patient quality time with our provider, please arrive at least 15 minutes before your scheduled appointment time.   If you have a lab appointment with the Cancer Center please come in thru the Main Entrance and check in at the main information desk.  You need to re-schedule your appointment should you arrive 10 or more minutes late.  We strive to give you quality time with our providers, and arriving late affects you and other patients whose appointments are after yours.  Also, if you no show three or more times for appointments you may be dismissed from the clinic at the providers discretion.     Again, thank you for choosing [facility].  Our hope is that these requests will decrease the amount of time that you wait before being seen by our physicians.       _____________________________________________________________  Should you have questions after your visit to [facility], please contact our office at [phone] and follow the prompts.  Our office hours are 8:00 a.m. and 4:30 p.m. Monday - Friday.  Please note that voicemails left after 4:00 p.m. may not be returned until the following business day.  We are closed weekends and  major holidays.  You do have access to a nurse 24-7, just call the main number to the clinic [phone] and do not press any options, hold on the line and a nurse will answer the phone.    For prescription refill requests, have your pharmacy contact our office and allow 72 hours.    Due to Covid, you will need to wear a mask upon entering the hospital. If you do not have a mask, a mask will be given to you at the Main Entrance upon arrival. For doctor visits, patients may have 1 support person age 41 or older with them. For treatment visits, patients can not have anyone with them due to social distancing guidelines and our immunocompromised population.

## 2021-05-13 ENCOUNTER — Other Ambulatory Visit: Payer: Self-pay

## 2021-05-13 ENCOUNTER — Inpatient Hospital Stay (HOSPITAL_COMMUNITY): Payer: Medicare Other

## 2021-05-13 NOTE — Progress Notes (Signed)
Patient presents today for iron. RN attempted to insert IV, failed 1st attempt for IV. When pulling needle out patient expresses he wants to leave. RN asked patient if anything was wrong and patient expressed that he was upset that the RN was not successful at first IV attempt and he wants to go home. RN explained to patient that another RN could attempt to insert an IV so he could get his iron but patient refused and states he wants to go home. RN attempted to put pressure with gauze on right hand/IV site where IV was removed but patient refused. The patient's hand was bleeding and patient got up to leave and took gauze from nurse. Patient left cancer center.

## 2021-05-20 ENCOUNTER — Inpatient Hospital Stay (HOSPITAL_COMMUNITY): Payer: Medicare Other

## 2021-05-20 ENCOUNTER — Other Ambulatory Visit: Payer: Self-pay

## 2021-05-20 VITALS — BP 138/62 | HR 64 | Temp 97.0°F | Resp 18

## 2021-05-20 DIAGNOSIS — D500 Iron deficiency anemia secondary to blood loss (chronic): Secondary | ICD-10-CM

## 2021-05-20 DIAGNOSIS — K922 Gastrointestinal hemorrhage, unspecified: Secondary | ICD-10-CM

## 2021-05-20 DIAGNOSIS — D509 Iron deficiency anemia, unspecified: Secondary | ICD-10-CM | POA: Diagnosis not present

## 2021-05-20 MED ORDER — LORATADINE 10 MG PO TABS
10.0000 mg | ORAL_TABLET | Freq: Once | ORAL | Status: AC
  Administered 2021-05-20: 10 mg via ORAL
  Filled 2021-05-20: qty 1

## 2021-05-20 MED ORDER — ACETAMINOPHEN 325 MG PO TABS
650.0000 mg | ORAL_TABLET | Freq: Once | ORAL | Status: AC
  Administered 2021-05-20: 650 mg via ORAL
  Filled 2021-05-20: qty 2

## 2021-05-20 MED ORDER — SODIUM CHLORIDE 0.9 % IV SOLN: Freq: Once | INTRAVENOUS | Status: AC

## 2021-05-20 MED ORDER — SODIUM CHLORIDE 0.9 % IV SOLN
510.0000 mg | Freq: Once | INTRAVENOUS | Status: AC
  Administered 2021-05-20: 510 mg via INTRAVENOUS
  Filled 2021-05-20: qty 510

## 2021-05-20 NOTE — Patient Instructions (Signed)
[facility]  Discharge Instructions: Thank you for choosing [facility] to provide your oncology and hematology care.  If you have a lab appointment with the Cancer Center, please come in thru the Main Entrance and check in at the main information desk.  Wear comfortable clothing and clothing appropriate for easy access to any Portacath or PICC line.   We strive to give you quality time with your provider. You may need to reschedule your appointment if you arrive late (15 or more minutes).  Arriving late affects you and other patients whose appointments are after yours.  Also, if you miss three or more appointments without notifying the office, you may be dismissed from the clinic at the provider's discretion.      For prescription refill requests, have your pharmacy contact our office and allow 72 hours for refills to be completed.        To help prevent nausea and vomiting after your treatment, we encourage you to take your nausea medication as directed.  BELOW ARE SYMPTOMS THAT SHOULD BE REPORTED IMMEDIATELY: *FEVER GREATER THAN 100.4 F (38 C) OR HIGHER *CHILLS OR SWEATING *NAUSEA AND VOMITING THAT IS NOT CONTROLLED WITH YOUR NAUSEA MEDICATION *UNUSUAL SHORTNESS OF BREATH *UNUSUAL BRUISING OR BLEEDING *URINARY PROBLEMS (pain or burning when urinating, or frequent urination) *BOWEL PROBLEMS (unusual diarrhea, constipation, pain near the anus) TENDERNESS IN MOUTH AND THROAT WITH OR WITHOUT PRESENCE OF ULCERS (sore throat, sores in mouth, or a toothache) UNUSUAL RASH, SWELLING OR PAIN  UNUSUAL VAGINAL DISCHARGE OR ITCHING   Items with * indicate a potential emergency and should be followed up as soon as possible or go to the Emergency Department if any problems should occur.  Please show the CHEMOTHERAPY ALERT CARD or IMMUNOTHERAPY ALERT CARD at check-in to the Emergency Department and triage nurse.  Should you have questions after your visit or need to cancel  or reschedule your appointment, please contact [facility] [phone]  and follow the prompts.  Office hours are 8:00 a.m. to 4:30 p.m. Monday - Friday. Please note that voicemails left after 4:00 p.m. may not be returned until the following business day.  We are closed weekends and major holidays. You have access to a nurse at all times for urgent questions. Please call the main number to the clinic [phone] and follow the prompts.  For any non-urgent questions, you may also contact your provider using MyChart. We now offer e-Visits for anyone 18 and older to request care online for non-urgent symptoms. For details visit [email or web address].   Also download the MyChart app! Go to the app store, search "MyChart", open the app, select [facility], and log in with your MyChart username and password.  Due to Covid, a mask is required upon entering the hospital/clinic. If you do not have a mask, one will be given to you upon arrival. For doctor visits, patients may have 1 support person aged 18 or older with them. For treatment visits, patients cannot have anyone with them due to current Covid guidelines and our immunocompromised population.  

## 2021-05-20 NOTE — Progress Notes (Signed)
Patient presents today for iron infusion.  Patient is in satisfactory condition with no complaints voiced.  Vital signs are stable.  We will proceed with treatment per MD orders.   Patient tolerated treatment well with no complaints voiced.  Patient refused the stay the entire 30 minute post wait time.  He stayed approximately 15 minutes and decided to leave.  Patient left ambulatory in stable condition.  Vital signs stable at discharge.  Follow up as scheduled.

## 2021-08-05 ENCOUNTER — Other Ambulatory Visit: Payer: Self-pay

## 2021-08-05 ENCOUNTER — Inpatient Hospital Stay (HOSPITAL_COMMUNITY): Payer: Medicare Other | Attending: Hematology

## 2021-08-05 DIAGNOSIS — D509 Iron deficiency anemia, unspecified: Secondary | ICD-10-CM | POA: Diagnosis present

## 2021-08-05 DIAGNOSIS — D500 Iron deficiency anemia secondary to blood loss (chronic): Secondary | ICD-10-CM

## 2021-08-05 LAB — CBC WITH DIFFERENTIAL/PLATELET
Abs Immature Granulocytes: 0.01 10*3/uL (ref 0.00–0.07)
Basophils Absolute: 0 10*3/uL (ref 0.0–0.1)
Basophils Relative: 1 %
Eosinophils Absolute: 0.1 10*3/uL (ref 0.0–0.5)
Eosinophils Relative: 2 %
HCT: 36.1 % — ABNORMAL LOW (ref 39.0–52.0)
Hemoglobin: 12 g/dL — ABNORMAL LOW (ref 13.0–17.0)
Immature Granulocytes: 0 %
Lymphocytes Relative: 44 %
Lymphs Abs: 1.5 10*3/uL (ref 0.7–4.0)
MCH: 29.6 pg (ref 26.0–34.0)
MCHC: 33.2 g/dL (ref 30.0–36.0)
MCV: 88.9 fL (ref 80.0–100.0)
Monocytes Absolute: 0.3 10*3/uL (ref 0.1–1.0)
Monocytes Relative: 7 %
Neutro Abs: 1.6 10*3/uL — ABNORMAL LOW (ref 1.7–7.7)
Neutrophils Relative %: 46 %
Platelets: 200 10*3/uL (ref 150–400)
RBC: 4.06 MIL/uL — ABNORMAL LOW (ref 4.22–5.81)
RDW: 14 % (ref 11.5–15.5)
WBC: 3.5 10*3/uL — ABNORMAL LOW (ref 4.0–10.5)
nRBC: 0 % (ref 0.0–0.2)

## 2021-08-05 LAB — IRON AND TIBC
Iron: 116 ug/dL (ref 45–182)
Saturation Ratios: 34 % (ref 17.9–39.5)
TIBC: 342 ug/dL (ref 250–450)
UIBC: 226 ug/dL

## 2021-08-05 LAB — FERRITIN: Ferritin: 21 ng/mL — ABNORMAL LOW (ref 24–336)

## 2021-08-11 NOTE — Progress Notes (Signed)
Virtual Visit via Telephone Note [facility]  I connected with Kyle Daniel  on 08/12/21 at 2:24 PM by telephone and verified that I am speaking with the correct person using two identifiers.  Location: Patient: Home Provider: [facility]   I discussed the limitations, risks, security and privacy concerns of performing an evaluation and management service by telephone and the availability of in person appointments. I also discussed with the patient that there may be a patient responsible charge related to this service. The patient expressed understanding and agreed to proceed.   REASON FOR VISIT:  Follow-up for iron deficiency anemia   PRIOR THERAPY: PRBC transfusions   CURRENT THERAPY: Intermittent IV iron (Daniel recent Feraheme on 05/13/2021 & 05/20/2021)   INTERVAL HISTORY:  Kyle Daniel 76 y.o. male returns for routine follow-up of his iron deficiency anemia.  He was last seen by Tarri Abernethy PA-C on 05/11/2021.  At today's visit, he reports feeling fair.  No recent hospitalizations, surgeries, or changes in baseline health status.  He reports that he continues to have black tarry stool with Daniel bowel movements.  No hematochezia or hematemesis.  He continues to follow-up with gastroenterology. He is taking oral iron supplement and B12 supplement at home, but states that his stool was black and tarry even before he started taking iron.  He continues to have chronic fatigue and occasional chest pain (he sees cardiologist) as well as mild dyspnea on exertion.  He denies any pica, restless leg, lightheadedness, or syncope.   He has 70% energy and 100% appetite. He endorses that he is maintaining a stable weight.    OBSERVATIONS/OBJECTIVE: Review of Systems  Constitutional:  Negative for chills, diaphoresis, fever, malaise/fatigue and weight loss.  HENT:         Teeth problems  Respiratory:  Negative for cough and shortness of breath.   Cardiovascular:   Negative for chest pain and palpitations.  Gastrointestinal:  Positive for melena. Negative for abdominal pain, blood in stool, nausea and vomiting.  Genitourinary:  Positive for frequency.  Neurological:  Positive for tingling and headaches. Negative for dizziness.    PHYSICAL EXAM (per limitations of virtual telephone visit): The patient is alert and oriented x 3, exhibiting adequate mentation, good mood, and ability to speak in full sentences and execute sound judgement.   ASSESSMENT & PLAN: 1.  Chronic iron deficiency anemia:  - Admitted to the hospital from 01/27/2020 through 01/29/2020 with dizziness and dyspnea on exertion. Found to have hemoglobin 5.6, received blood transfusions. - History of GI bleed in October 2020 at [facility] in [place], required 5 units of PRBC. - EGD on 01/28/2020 and colonoscopy on 01/29/2019 were nonrevealing. - Capsule study on 03/12/2020, stomach with several images of blood flecks or wisps of blood without obvious source.  Multiple images of duodenal, proximal jejunal areas with what appears lymphangiectasia's without active bleeding.  Further along in the study, images with possible erosions/ulcers. - Other nutritional deficiency work-up was negative.  SPEP was negative. - Feraheme infusion Daniel recently on 05/13/2021 & 05/20/2021 - Daniel recent labs (08/05/2021): Hgb 12.0/MCV 88.9.  Ferritin 21, iron saturation 34%. - Continues to report melena with Daniel bowel movements   - Symptomatic with mild to moderate fatigue   - He is taking B12 and iron supplements at home (stools were dark even before starting iron) - PLAN: Recommend IV Feraheme x2.  We will repeat CBC/iron panel in 3 months with follow-up visit at that time. -  Continue to follow with gastroenterologists. (Re-referral sent to Dr. Gala Romney - patient was lost to follow-up) - Patient aware of alarm symptoms that would prompt immediate medical attention at the ED.   2.  Smoking history: - Reviewed results of  CT chest from 05/26/2020 which was lung RADS 1. - PLAN: He is overdue for his annual LDCT (was due in November 2022).   3.  Social/family history: - Active smoker for 56 years, 1.5 PPD current smoker - Sisters x 2 with breast cancer   FOLLOW UP INSTRUCTIONS: Feraheme x 2 LDCT chest Labs in 3 months Phone visit after labs (Re-referral sent to Dr. Gala Romney for Kyle Daniel w/ suspected GI bleeding - patient was lost to follow-up)    I discussed the assessment and treatment plan with the patient. The patient was provided an opportunity to ask questions and all were answered. The patient agreed with the plan and demonstrated an understanding of the instructions.   The patient was advised to call back or seek an in-person evaluation if the symptoms worsen or if the condition fails to improve as anticipated.  I provided 13 minutes of non-face-to-face time during this encounter.   Harriett Rush, PA-C 08/12/21 2:37 PM

## 2021-08-12 ENCOUNTER — Inpatient Hospital Stay (HOSPITAL_BASED_OUTPATIENT_CLINIC_OR_DEPARTMENT_OTHER): Payer: Medicare Other | Admitting: Physician Assistant

## 2021-08-12 ENCOUNTER — Other Ambulatory Visit (HOSPITAL_COMMUNITY): Payer: Self-pay

## 2021-08-12 DIAGNOSIS — D500 Iron deficiency anemia secondary to blood loss (chronic): Secondary | ICD-10-CM

## 2021-08-12 DIAGNOSIS — Z87891 Personal history of nicotine dependence: Secondary | ICD-10-CM | POA: Diagnosis not present

## 2021-08-12 DIAGNOSIS — Z122 Encounter for screening for malignant neoplasm of respiratory organs: Secondary | ICD-10-CM

## 2021-08-12 NOTE — Progress Notes (Signed)
LDCT order placed.

## 2021-08-16 ENCOUNTER — Encounter: Payer: Self-pay | Admitting: Internal Medicine

## 2021-08-18 ENCOUNTER — Inpatient Hospital Stay (HOSPITAL_COMMUNITY): Payer: Medicare Other

## 2021-08-18 ENCOUNTER — Other Ambulatory Visit: Payer: Self-pay

## 2021-08-18 VITALS — BP 137/60 | HR 65 | Temp 97.4°F | Resp 18

## 2021-08-18 DIAGNOSIS — D509 Iron deficiency anemia, unspecified: Secondary | ICD-10-CM | POA: Diagnosis not present

## 2021-08-18 DIAGNOSIS — K922 Gastrointestinal hemorrhage, unspecified: Secondary | ICD-10-CM

## 2021-08-18 DIAGNOSIS — D500 Iron deficiency anemia secondary to blood loss (chronic): Secondary | ICD-10-CM

## 2021-08-18 MED ORDER — LORATADINE 10 MG PO TABS
10.0000 mg | ORAL_TABLET | Freq: Once | ORAL | Status: AC
  Administered 2021-08-18: 14:00:00 10 mg via ORAL
  Filled 2021-08-18: qty 1

## 2021-08-18 MED ORDER — SODIUM CHLORIDE 0.9 % IV SOLN: Freq: Once | INTRAVENOUS | Status: AC

## 2021-08-18 MED ORDER — ACETAMINOPHEN 325 MG PO TABS
650.0000 mg | ORAL_TABLET | Freq: Once | ORAL | Status: AC
  Administered 2021-08-18: 14:00:00 650 mg via ORAL
  Filled 2021-08-18: qty 2

## 2021-08-18 MED ORDER — SODIUM CHLORIDE 0.9 % IV SOLN
510.0000 mg | Freq: Once | INTRAVENOUS | Status: AC
  Administered 2021-08-18: 510 mg via INTRAVENOUS
  Filled 2021-08-18: qty 510

## 2021-08-18 NOTE — Patient Instructions (Signed)
[facility]  Discharge Instructions: °Thank you for choosing [facility] to provide your oncology and hematology care.  °If you have a lab appointment with the Cancer Center, please come in thru the Main Entrance and check in at the main information desk. ° °Wear comfortable clothing and clothing appropriate for easy access to any Portacath or PICC line.  ° °We strive to give you quality time with your provider. You may need to reschedule your appointment if you arrive late (15 or more minutes).  Arriving late affects you and other patients whose appointments are after yours.  Also, if you miss three or more appointments without notifying the office, you may be dismissed from the clinic at the provider’s discretion.    °  °For prescription refill requests, have your pharmacy contact our office and allow 72 hours for refills to be completed.   ° °Today you received the following chemotherapy and/or immunotherapy agents Feraheme    °  °To help prevent nausea and vomiting after your treatment, we encourage you to take your nausea medication as directed. ° °BELOW ARE SYMPTOMS THAT SHOULD BE REPORTED IMMEDIATELY: °*FEVER GREATER THAN 100.4 F (38 °C) OR HIGHER °*CHILLS OR SWEATING °*NAUSEA AND VOMITING THAT IS NOT CONTROLLED WITH YOUR NAUSEA MEDICATION °*UNUSUAL SHORTNESS OF BREATH °*UNUSUAL BRUISING OR BLEEDING °*URINARY PROBLEMS (pain or burning when urinating, or frequent urination) °*BOWEL PROBLEMS (unusual diarrhea, constipation, pain near the anus) °TENDERNESS IN MOUTH AND THROAT WITH OR WITHOUT PRESENCE OF ULCERS (sore throat, sores in mouth, or a toothache) °UNUSUAL RASH, SWELLING OR PAIN  °UNUSUAL VAGINAL DISCHARGE OR ITCHING  ° °Items with * indicate a potential emergency and should be followed up as soon as possible or go to the Emergency Department if any problems should occur. ° °Please show the CHEMOTHERAPY ALERT CARD or IMMUNOTHERAPY ALERT CARD at check-in to the Emergency  Department and triage nurse. ° °Should you have questions after your visit or need to cancel or reschedule your appointment, please contact [facility] [phone]  and follow the prompts.  Office hours are 8:00 a.m. to 4:30 p.m. Monday - Friday. Please note that voicemails left after 4:00 p.m. may not be returned until the following business day.  We are closed weekends and major holidays. You have access to a nurse at all times for urgent questions. Please call the main number to the clinic [phone] and follow the prompts. ° °For any non-urgent questions, you may also contact your provider using MyChart. We now offer e-Visits for anyone 18 and older to request care online for non-urgent symptoms. For details visit [email or web address]. °  °Also download the MyChart app! Go to the app store, search "MyChart", open the app, select [facility], and log in with your MyChart username and password. ° °Due to Covid, a mask is required upon entering the hospital/clinic. If you do not have a mask, one will be given to you upon arrival. For doctor visits, patients may have 1 support person aged 18 or older with them. For treatment visits, patients cannot have anyone with them due to current Covid guidelines and our immunocompromised population.  °

## 2021-08-18 NOTE — Progress Notes (Signed)
Patient presents today for Feraheme infusion per providers order.  Vital signs WNL.  Patient has no new complaints at this time.    Peripheral IV started and blood return noted pre and post infusion.  Feraheme infusion  given today per MD orders.  Stable during infusion without adverse affects.  Vital signs stable.  No complaints at this time.  Discharge from clinic ambulatory in stable condition.  Alert and oriented X 3.  Follow up with [facility] as scheduled.  

## 2021-08-25 ENCOUNTER — Inpatient Hospital Stay (HOSPITAL_COMMUNITY): Payer: Medicare Other | Attending: Hematology

## 2021-08-25 VITALS — BP 151/75 | HR 76 | Temp 97.8°F | Resp 18

## 2021-08-25 DIAGNOSIS — K922 Gastrointestinal hemorrhage, unspecified: Secondary | ICD-10-CM

## 2021-08-25 DIAGNOSIS — D509 Iron deficiency anemia, unspecified: Secondary | ICD-10-CM | POA: Insufficient documentation

## 2021-08-25 DIAGNOSIS — D500 Iron deficiency anemia secondary to blood loss (chronic): Secondary | ICD-10-CM

## 2021-08-25 MED ORDER — SODIUM CHLORIDE 0.9 % IV SOLN: Freq: Once | INTRAVENOUS | Status: AC

## 2021-08-25 MED ORDER — ACETAMINOPHEN 325 MG PO TABS
650.0000 mg | ORAL_TABLET | Freq: Once | ORAL | Status: AC
  Administered 2021-08-25: 650 mg via ORAL
  Filled 2021-08-25: qty 2

## 2021-08-25 MED ORDER — SODIUM CHLORIDE 0.9 % IV SOLN
510.0000 mg | Freq: Once | INTRAVENOUS | Status: AC
  Administered 2021-08-25: 510 mg via INTRAVENOUS
  Filled 2021-08-25: qty 510

## 2021-08-25 MED ORDER — LORATADINE 10 MG PO TABS
10.0000 mg | ORAL_TABLET | Freq: Once | ORAL | Status: AC
  Administered 2021-08-25: 10 mg via ORAL
  Filled 2021-08-25: qty 1

## 2021-08-25 NOTE — Patient Instructions (Signed)
[facility]  Discharge Instructions: Thank you for choosing [facility] to provide your oncology and hematology care.  If you have a lab appointment with the Cancer Center, please come in thru the Main Entrance and check in at the main information desk.  Wear comfortable clothing and clothing appropriate for easy access to any Portacath or PICC line.   We strive to give you quality time with your provider. You may need to reschedule your appointment if you arrive late (15 or more minutes).  Arriving late affects you and other patients whose appointments are after yours.  Also, if you miss three or more appointments without notifying the office, you may be dismissed from the clinic at the provider's discretion.      For prescription refill requests, have your pharmacy contact our office and allow 72 hours for refills to be completed.        To help prevent nausea and vomiting after your treatment, we encourage you to take your nausea medication as directed.  BELOW ARE SYMPTOMS THAT SHOULD BE REPORTED IMMEDIATELY: *FEVER GREATER THAN 100.4 F (38 C) OR HIGHER *CHILLS OR SWEATING *NAUSEA AND VOMITING THAT IS NOT CONTROLLED WITH YOUR NAUSEA MEDICATION *UNUSUAL SHORTNESS OF BREATH *UNUSUAL BRUISING OR BLEEDING *URINARY PROBLEMS (pain or burning when urinating, or frequent urination) *BOWEL PROBLEMS (unusual diarrhea, constipation, pain near the anus) TENDERNESS IN MOUTH AND THROAT WITH OR WITHOUT PRESENCE OF ULCERS (sore throat, sores in mouth, or a toothache) UNUSUAL RASH, SWELLING OR PAIN  UNUSUAL VAGINAL DISCHARGE OR ITCHING   Items with * indicate a potential emergency and should be followed up as soon as possible or go to the Emergency Department if any problems should occur.  Please show the CHEMOTHERAPY ALERT CARD or IMMUNOTHERAPY ALERT CARD at check-in to the Emergency Department and triage nurse.  Should you have questions after your visit or need to cancel  or reschedule your appointment, please contact [facility] [phone]  and follow the prompts.  Office hours are 8:00 a.m. to 4:30 p.m. Monday - Friday. Please note that voicemails left after 4:00 p.m. may not be returned until the following business day.  We are closed weekends and major holidays. You have access to a nurse at all times for urgent questions. Please call the main number to the clinic [phone] and follow the prompts.  For any non-urgent questions, you may also contact your provider using MyChart. We now offer e-Visits for anyone 18 and older to request care online for non-urgent symptoms. For details visit [email or web address].   Also download the MyChart app! Go to the app store, search "MyChart", open the app, select [facility], and log in with your MyChart username and password.  Due to Covid, a mask is required upon entering the hospital/clinic. If you do not have a mask, one will be given to you upon arrival. For doctor visits, patients may have 1 support person aged 18 or older with them. For treatment visits, patients cannot have anyone with them due to current Covid guidelines and our immunocompromised population.  

## 2021-08-25 NOTE — Progress Notes (Signed)
Patient presents today for iron infusion.  Patient is in satisfactory condition with no complaints voiced.  Vital signs are stable.  We will proceed with treatment per MD orders.   Patient tolerated treatment well with no complaints voiced.  Patient refused to stay the entire 30 minute recommended wait time post iron infusion.  Patient stayed approximately 5 minutes post iron infusion before leaving.  Patient left ambulatory in stable condition.  Vital signs stable at discharge.  Follow up as scheduled.

## 2021-09-01 ENCOUNTER — Encounter: Payer: Self-pay | Admitting: Gastroenterology

## 2021-09-21 ENCOUNTER — Ambulatory Visit (HOSPITAL_COMMUNITY)
Admission: RE | Admit: 2021-09-21 | Discharge: 2021-09-21 | Disposition: A | Discharge status: Home / Self Care | Payer: Medicare Other | Source: Ambulatory Visit | Attending: Physician Assistant | Admitting: Physician Assistant

## 2021-09-21 ENCOUNTER — Other Ambulatory Visit: Payer: Self-pay

## 2021-09-21 DIAGNOSIS — Z122 Encounter for screening for malignant neoplasm of respiratory organs: Secondary | ICD-10-CM | POA: Insufficient documentation

## 2021-09-21 DIAGNOSIS — Z87891 Personal history of nicotine dependence: Secondary | ICD-10-CM | POA: Insufficient documentation

## 2021-10-01 ENCOUNTER — Ambulatory Visit: Payer: Medicare Other | Admitting: Cardiology

## 2021-10-01 ENCOUNTER — Encounter: Payer: Self-pay | Admitting: Cardiology

## 2021-10-01 ENCOUNTER — Other Ambulatory Visit: Payer: Self-pay

## 2021-10-01 VITALS — BP 122/66 | HR 84 | Ht 73.0 in | Wt 260.0 lb

## 2021-10-01 DIAGNOSIS — I10 Essential (primary) hypertension: Secondary | ICD-10-CM

## 2021-10-01 DIAGNOSIS — I2510 Atherosclerotic heart disease of native coronary artery without angina pectoris: Secondary | ICD-10-CM

## 2021-10-01 DIAGNOSIS — E782 Mixed hyperlipidemia: Secondary | ICD-10-CM

## 2021-10-01 NOTE — Patient Instructions (Signed)
Follow-Up: °Follow up with Dr. Branch in 6 months ° °Any Other Special Instructions Will Be Listed Below (If Applicable). ° ° ° ° °If you need a refill on your cardiac medications before your next appointment, please call your pharmacy. ° °

## 2021-10-01 NOTE — Progress Notes (Signed)
Clinical Summary Mr. Friedt is a 76 y.o.male seen today for follow up of the following medical problems.    1. CAD - history of CABG - underwent three-vessel coronary artery bypass graft surgery on 12/18/2002 with a LIMA to the LAD, left radial to the ramus intermedius, and SVG to circumflex marginal Travion Ke.   2020 nuclear stress: mild ischemia   - no recent chest pains. No SOB/DOE - compliant with meds      2. Palpitations - infrequent and short in duration, resolved.  - high caffeine intake     3. HTN - he is compliant withmeds       4. Hyperlipidemia    11/2020 TC 102 TG 44 HDL 36 LDL 57 - has been on pravastatin 80mg  daily.    5. Cartoid stenosis. - upcoming and appt wit vascular next month.    5. Anemia, heme + stool - July 2021 Hgb 5.6 on admission. colonoscopy and EGD during admission without obvious source for bleeding - followed by GI and hematology - he is on IV iron   Past Medical History:  Diagnosis Date   Anemia    ASCVD (arteriosclerotic cardiovascular disease)    S/P CABG surgery  in 2004; cardiac cath in 2005 revealed patent grafts.   BPH (benign prostatic hypertrophy)    Carotid artery occlusion    Cerebrovascular disease    bilateral bruits   Coronary artery disease    DM (diabetes mellitus) (HCC)    AODM-- insulin requiring for the past 48yrs   Hx of adenomatous colonic polyps    Hyperlipidemia    Hypertension    Pneumonia 2020   Tobacco abuse    50 pack years; current 1 pack per day     Allergies  Allergen Reactions   Lisinopril Cough     Current Outpatient Medications  Medication Sig Dispense Refill   albuterol (VENTOLIN HFA) 108 (90 Base) MCG/ACT inhaler Inhale 1-2 puffs into the lungs every 6 (six) hours as needed for wheezing or shortness of breath.     Alcohol Swabs (ALCOHOL WIPES) 70 % PADS Apply topically.     amitriptyline (ELAVIL) 25 MG tablet Take 25 mg by mouth at bedtime.     amLODipine (NORVASC) 10 MG  tablet Take 1 tablet (10 mg total) by mouth daily. 30 tablet 5   aspirin 81 MG tablet Take 1 tablet (81 mg total) by mouth daily with breakfast. 30 tablet 1   cholecalciferol (VITAMIN D3) 25 MCG (1000 UNIT) tablet Take 1,000 Units by mouth every morning.     dorzolamide-timolol (COSOPT) 22.3-6.8 MG/ML ophthalmic solution Place 1 drop into the right eye in the morning.     ferrous sulfate 325 (65 FE) MG tablet Take 325 mg by mouth daily with breakfast.     finasteride (PROSCAR) 5 MG tablet Take 5 mg by mouth at bedtime.     gabapentin (NEURONTIN) 100 MG capsule Take 200 mg by mouth 2 (two) times daily.      insulin glargine (LANTUS) 100 UNIT/ML injection Inject 28 Units into the skin in the morning and at bedtime.     insulin glargine-yfgn (SEMGLEE) 100 UNIT/ML injection INJECT 28 UNITS UNDER SKIN TWO TIMES A DAY FOR DIABETES MELLITUS DO NOT MIX WITH OTHER INSULINS IN THE SAME SYRINGE-DISCARD BOTTLE 28 DAYS AFTER OPENING **REPLACES LANTUS**     losartan (COZAAR) 25 MG tablet Take 1 tablet (25 mg total) by mouth daily. 90 tablet 2   nitroGLYCERIN (NITROSTAT)  0.4 MG SL tablet Place 1 tablet (0.4 mg total) under the tongue every 5 (five) minutes as needed for chest pain. 25 tablet 3   Omega-3 Fatty Acids (FISH OIL) 1000 MG CAPS Take 3 capsules by mouth 2 (two) times daily before a meal.     oxyCODONE-acetaminophen (PERCOCET) 5-325 MG tablet Take 1 tablet by mouth every 6 (six) hours as needed for severe pain. 8 tablet 0   pioglitazone (ACTOS) 45 MG tablet TAKE ONE TABLET BY MOUTH EVERY DAY FOR DIABETES MELLITUS     pravastatin (PRAVACHOL) 80 MG tablet Take 1 tablet (80 mg total) by mouth daily. 90 tablet 1   prazosin (MINIPRESS) 2 MG capsule Take 2 mg by mouth at bedtime.     torsemide (DEMADEX) 20 MG tablet Take 1 tablet by mouth daily.     traZODone (DESYREL) 50 MG tablet Take 50 mg by mouth at bedtime.     No current facility-administered medications for this visit.     Past Surgical History:   Procedure Laterality Date   BIOPSY  01/28/2020   Procedure: BIOPSY;  Surgeon: Corbin Ade, MD;  Location: [facility];  Service: Endoscopy;;   CERVICAL DISCECTOMY  1996   anterior cervical spine discectomy and fusion   CHOLECYSTECTOMY  06/17/2003   by Lebron Conners MD   COLONOSCOPY W/ POLYPECTOMY     4 adenomatous polyps   COLONOSCOPY WITH PROPOFOL N/A 01/29/2020   normal colon, normal appearing TI.    CORONARY ARTERY BYPASS GRAFT     ENDARTERECTOMY Right 12/08/2020   Procedure: RIGHT CAROTID ENDARTERECTOMY;  Surgeon: Larina Earthly, MD;  [facility] OR;  Service: Vascular;  Laterality: Right;   ESOPHAGOGASTRODUODENOSCOPY  07/04/2007   Dr. Darrick Penna; normal esophagus, erythema/edema in gastric antrum s/p biopsied, duodenitis.  Pathology positive for H. pylori.   ESOPHAGOGASTRODUODENOSCOPY (EGD) WITH PROPOFOL N/A 01/28/2020    mild erosive reflux esophagitis, s/p biopsy of gastric mucosa, normal duodenum overall except for minimally eroded second portion of mucosa s/p biopsy. Pathology: slight chronic gastritis, negative H.pylori, benign duodenal mucosa, no sprue.    GIVENS CAPSULE STUDY N/A 02/26/2020   Procedure: GIVENS CAPSULE STUDY;  Surgeon: Lanelle Bal, DO;  Location: [facility];  Service: Endoscopy;  Laterality: N/A;  7:30am   GIVENS CAPSULE STUDY N/A 03/12/2020   Procedure: GIVENS CAPSULE STUDY;  Surgeon: Corbin Ade, MD;  Location: [facility];  Service: Endoscopy;  Laterality: N/A;  7:30am, pt to arrive early to receive meds 30 mins prior to capsule   PATCH ANGIOPLASTY Right 12/08/2020   Procedure: PATCH ANGIOPLASTY HEMASHIELD TO RIGHT INTERNAL CAROTID ARTERY;  Surgeon: Larina Earthly, MD;  Location: [facility] OR;  Service: Vascular;  Laterality: Right;     Allergies  Allergen Reactions   Lisinopril Cough      Family History  Problem Relation Age of Onset   Pneumonia Mother 82       complications of diabetes   Diabetes Mother    Pneumonia Father 26        complication of pneumonia   Cancer Sister        breast   Cancer Sister        breast cancer   Colon cancer Neg Hx      Social History Mr. Mulrooney reports that he has been smoking cigarettes. He started smoking about 61 years ago. He has a 75.00 pack-year smoking history. He has never used smokeless tobacco. Mr. Avina reports no history of alcohol  use.   Review of Systems CONSTITUTIONAL: No weight loss, fever, chills, weakness or fatigue.  HEENT: Eyes: No visual loss, blurred vision, double vision or yellow sclerae.No hearing loss, sneezing, congestion, runny nose or sore throat.  SKIN: No rash or itching.  CARDIOVASCULAR: per hpi RESPIRATORY: No shortness of breath, cough or sputum.  GASTROINTESTINAL: No anorexia, nausea, vomiting or diarrhea. No abdominal pain or blood.  GENITOURINARY: No burning on urination, no polyuria NEUROLOGICAL: No headache, dizziness, syncope, paralysis, ataxia, numbness or tingling in the extremities. No change in bowel or bladder control.  MUSCULOSKELETAL: No muscle, back pain, joint pain or stiffness.  LYMPHATICS: No enlarged nodes. No history of splenectomy.  PSYCHIATRIC: No history of depression or anxiety.  ENDOCRINOLOGIC: No reports of sweating, cold or heat intolerance. No polyuria or polydipsia.  Marland Kitchen.   Physical Examination Today's Vitals   10/01/21 1117  BP: 122/66  Pulse: 84  SpO2: 97%  Weight: 260 lb (117.9 kg)  Height: 6\' 1"  (1.854 m)   Body mass index is 34.3 kg/m.  Gen: resting comfortably, no acute distress HEENT: no scleral icterus, pupils equal round and reactive, no palptable cervical adenopathy,  CV: RRR, no m/r/g, no jvd Resp: Clear to auscultation bilaterally GI: abdomen is soft, non-tender, non-distended, normal bowel sounds, no hepatosplenomegaly MSK: extremities are warm, no edema.  Skin: warm, no rash Neuro:  no focal deficits Psych: appropriate affect   Diagnostic Studies  Echocardiogram 05/31/2019:   1. Left  ventricular ejection fraction, by visual estimation, is 60 to  65%. The left ventricle has normal function. There is moderately increased  left ventricular hypertrophy.   2. Left ventricular diastolic parameters are consistent with Grade II  diastolic dysfunction (pseudonormalization).   3. Global right ventricle has normal systolic function.The right  ventricular size is normal. No increase in right ventricular wall  thickness.   4. Left atrial size was mildly dilated.   5. Right atrial size was normal.   6. Presence of pericardial fat pad.   7. Mild aortic valve annular calcification.   8. Mild to moderate mitral annular calcification.   9. The mitral valve is grossly normal. Trace mitral valve regurgitation.  10. The tricuspid valve is grossly normal. Tricuspid valve regurgitation  is trivial.  11. The aortic valve is tricuspid. Aortic valve regurgitation is not  visualized.  12. The pulmonic valve was grossly normal. Pulmonic valve regurgitation is  trivial.  13. TR signal is inadequate for assessing pulmonary artery systolic  pressure.  14. The inferior vena cava is normal in size with greater than 50%  respiratory variability, suggesting right atrial pressure of 3 mmHg.    09/2020 carotid US IMPRESSION: At least 50-69% stenosis of the internal carotid arteries bilaterally. There is long segment shadowing plaque within the distal common and proximal internal carotid arteries bilaterally which may be obscuring regions of higher velocity/stenosis. Further evaluation with CT angiography of the neck should be considered to more definitively characterize degree of stenosis.   These results will be called to the ordering clinician or representative by the Radiologist Assistant, and communication documented in the PACS or [employer] Dashboard.   Assessment and Plan  1. CAD - no recent symptoms, continue current meds     2. HTN - he is at goal, continue current meds     3.  Hyperlipidemia Reuqest pcp labs, overall LDL has been at goal - continue current meds        F/u 6 months   Antoine PocheJonathan F. Isahi Godwin,  M.D.

## 2021-11-03 ENCOUNTER — Encounter: Payer: Self-pay | Admitting: Vascular Surgery

## 2021-11-03 ENCOUNTER — Ambulatory Visit: Payer: Medicare Other | Admitting: Vascular Surgery

## 2021-11-03 ENCOUNTER — Ambulatory Visit (INDEPENDENT_AMBULATORY_CARE_PROVIDER_SITE_OTHER): Payer: Medicare Other

## 2021-11-03 VITALS — BP 147/76 | HR 74 | Temp 97.3°F | Resp 14 | Ht 73.0 in | Wt 262.4 lb

## 2021-11-03 DIAGNOSIS — Z9889 Other specified postprocedural states: Secondary | ICD-10-CM

## 2021-11-03 NOTE — Progress Notes (Signed)
? ? [facility] ? ?Patient name: Kyle Daniel MRN: [id number] DOB: Oct 23, 1945 Sex: male ? ?REASON FOR VISIT: Follow-up carotid disease ? ?HPI: ?Kyle Daniel is a 76 y.o. male here today for follow-up.  He is status post right carotid endarterectomy for severe asymptomatic disease on 12/08/2020.  He denies any neurologic symptoms.  Specifically no amaurosis fugax, transient.  Ischemic attack or stroke.  He does report some hypersensitivity at the area of his right endarterectomy incision. ? ? ?Past Medical History:  ?Diagnosis Date  ? Anemia   ? ASCVD (arteriosclerotic cardiovascular disease)   ? S/P CABG surgery  in 2004; cardiac cath in 2005 revealed patent grafts.  ? BPH (benign prostatic hypertrophy)   ? Carotid artery occlusion   ? Cerebrovascular disease   ? bilateral bruits  ? Coronary artery disease   ? DM (diabetes mellitus) (HCC)   ? AODM-- insulin requiring for the past 31yrs  ? Hx of adenomatous colonic polyps   ? Hyperlipidemia   ? Hypertension   ? Pneumonia 2020  ? Tobacco abuse   ? 50 pack years; current 1 pack per day  ? ? ?Family History  ?Problem Relation Age of Onset  ? Pneumonia Mother 54  ?     complications of diabetes  ? Diabetes Mother   ? Pneumonia Father 63  ?     complication of pneumonia  ? Cancer Sister   ?     breast  ? Cancer Sister   ?     breast cancer  ? Colon cancer Neg Hx   ? ? ?SOCIAL HISTORY: ?Social History  ? ?Tobacco Use  ? Smoking status: Every Day  ?  Packs/day: 1.50  ?  Years: 50.00  ?  Pack years: 75.00  ?  Types: Cigarettes  ?  Start date: 09/05/1960  ? Smokeless tobacco: Never  ?Substance Use Topics  ? Alcohol use: No  ?  Comment: quit 1980's  ? ? ?Allergies  ?Allergen Reactions  ? Lisinopril Cough  ? ? ?Current Outpatient Medications  ?Medication Sig Dispense Refill  ? albuterol (VENTOLIN HFA) 108 (90 Base) MCG/ACT inhaler Inhale 1-2 puffs into the lungs every 6 (six) hours as needed for wheezing or shortness of  breath.    ? Alcohol Swabs (ALCOHOL WIPES) 70 % PADS Apply topically.    ? amitriptyline (ELAVIL) 25 MG tablet Take 25 mg by mouth at bedtime.    ? amLODipine (NORVASC) 10 MG tablet Take 1 tablet (10 mg total) by mouth daily. 30 tablet 5  ? aspirin 81 MG tablet Take 1 tablet (81 mg total) by mouth daily with breakfast. 30 tablet 1  ? cholecalciferol (VITAMIN D3) 25 MCG (1000 UNIT) tablet Take 1,000 Units by mouth every morning.    ? dorzolamide-timolol (COSOPT) 22.3-6.8 MG/ML ophthalmic solution Place 1 drop into the right eye in the morning.    ? ferrous sulfate 325 (65 FE) MG tablet Take 325 mg by mouth daily with breakfast.    ? finasteride (PROSCAR) 5 MG tablet Take 5 mg by mouth at bedtime.    ? gabapentin (NEURONTIN) 100 MG capsule Take 200 mg by mouth 2 (two) times daily.     ? insulin glargine (LANTUS) 100 UNIT/ML injection Inject 28 Units into the skin in the morning and at bedtime.    ? insulin glargine-yfgn (SEMGLEE) 100 UNIT/ML injection INJECT 28 UNITS UNDER SKIN TWO TIMES A DAY FOR DIABETES MELLITUS DO NOT MIX WITH OTHER INSULINS IN  THE SAME SYRINGE-DISCARD BOTTLE 28 DAYS AFTER OPENING **REPLACES LANTUS**    ? losartan (COZAAR) 25 MG tablet Take 1 tablet (25 mg total) by mouth daily. 90 tablet 2  ? nitroGLYCERIN (NITROSTAT) 0.4 MG SL tablet Place 1 tablet (0.4 mg total) under the tongue every 5 (five) minutes as needed for chest pain. 25 tablet 3  ? Omega-3 Fatty Acids (FISH OIL) 1000 MG CAPS Take 3 capsules by mouth 2 (two) times daily before a meal.    ? oxyCODONE-acetaminophen (PERCOCET) 5-325 MG tablet Take 1 tablet by mouth every 6 (six) hours as needed for severe pain. 8 tablet 0  ? pioglitazone (ACTOS) 45 MG tablet TAKE ONE TABLET BY MOUTH EVERY DAY FOR DIABETES MELLITUS    ? pravastatin (PRAVACHOL) 80 MG tablet Take 1 tablet (80 mg total) by mouth daily. 90 tablet 1  ? prazosin (MINIPRESS) 2 MG capsule Take 2 mg by mouth at bedtime.    ? torsemide (DEMADEX) 20 MG tablet Take 1 tablet by mouth  daily.    ? traZODone (DESYREL) 50 MG tablet Take 50 mg by mouth at bedtime.    ? ?No current facility-administered medications for this visit.  ? ? ?REVIEW OF SYSTEMS:  ?[X]  denotes positive finding, [ ]  denotes negative finding ?Cardiac  Comments:  ?Chest pain or chest pressure:    ?Shortness of breath upon exertion:    ?Short of breath when lying flat:    ?Irregular heart rhythm:    ?    ?Vascular    ?Pain in calf, thigh, or hip brought on by ambulation:    ?Pain in feet at night that wakes you up from your sleep:     ?Blood clot in your veins:    ?Leg swelling:     ?    ? ? ?PHYSICAL EXAM: ?Vitals:  ? 11/03/21 0951 11/03/21 0955  ?BP: (!) 154/80 (!) 147/76  ?Pulse: 74   ?Resp: 14   ?Temp: (!) 97.3 ?F (36.3 ?C)   ?TempSrc: Temporal   ?SpO2: 96%   ?Weight: 262 lb 6.4 oz (119 kg)   ?Height: 6\' 1"  (1.854 m)   ? ? ?GENERAL: The patient is a well-nourished male, in no acute distress. The vital signs are documented above. ?CARDIOVASCULAR: Carotid arteries without bruits bilaterally.  2+ right radial pulse.  Has had a prior left radial artery harvest ?PULMONARY: There is good air exchange  ?MUSCULOSKELETAL: There are no major deformities or cyanosis. ?NEUROLOGIC: No focal weakness or paresthesias are detected. ?SKIN: There are no ulcers or rashes noted. ?PSYCHIATRIC: The patient has a normal affect. ? ?DATA:  ?Carotid duplex today reveals widely patent endarterectomy.  No significant stenosis in his left carotid artery. ? ?MEDICAL ISSUES: ?Stable status post right carotid endarterectomy for asymptomatic disease in May 2022.  We will see him again in 1 year with repeat carotid duplex.  He knows to report immediately to the emergency room should he develop any neurologic deficits. ? ? ? ?01/03/22, MD FACS ?[facility] ?Office Tel [phone] ? ?Note: Portions of this report may have been transcribed using voice recognition software.  Every effort has been made to ensure accuracy;  however, inadvertent computerized transcription errors may still be present. ?

## 2021-11-08 ENCOUNTER — Telehealth: Payer: Self-pay

## 2021-11-08 NOTE — Telephone Encounter (Signed)
Pt called and said she should have refills on "Mapoproll 50 MG " per patient - did not see this on her med list.  I told her I would send you a message to you.  Not sure what med she is asking for.  She spelled this for me. ?

## 2021-11-08 NOTE — Telephone Encounter (Signed)
This is not an [facility] patient ?

## 2021-11-10 ENCOUNTER — Inpatient Hospital Stay (HOSPITAL_COMMUNITY): Payer: Medicare Other | Attending: Physician Assistant

## 2021-11-10 DIAGNOSIS — D509 Iron deficiency anemia, unspecified: Secondary | ICD-10-CM | POA: Insufficient documentation

## 2021-11-10 DIAGNOSIS — D500 Iron deficiency anemia secondary to blood loss (chronic): Secondary | ICD-10-CM

## 2021-11-10 LAB — CBC WITH DIFFERENTIAL/PLATELET
Abs Immature Granulocytes: 0.02 10*3/uL (ref 0.00–0.07)
Basophils Absolute: 0 10*3/uL (ref 0.0–0.1)
Basophils Relative: 1 %
Eosinophils Absolute: 0.1 10*3/uL (ref 0.0–0.5)
Eosinophils Relative: 3 %
HCT: 37.6 % — ABNORMAL LOW (ref 39.0–52.0)
Hemoglobin: 12.4 g/dL — ABNORMAL LOW (ref 13.0–17.0)
Immature Granulocytes: 1 %
Lymphocytes Relative: 39 %
Lymphs Abs: 1.3 10*3/uL (ref 0.7–4.0)
MCH: 29 pg (ref 26.0–34.0)
MCHC: 33 g/dL (ref 30.0–36.0)
MCV: 88.1 fL (ref 80.0–100.0)
Monocytes Absolute: 0.2 10*3/uL (ref 0.1–1.0)
Monocytes Relative: 6 %
Neutro Abs: 1.7 10*3/uL (ref 1.7–7.7)
Neutrophils Relative %: 50 %
Platelets: 190 10*3/uL (ref 150–400)
RBC: 4.27 MIL/uL (ref 4.22–5.81)
RDW: 14 % (ref 11.5–15.5)
WBC: 3.4 10*3/uL — ABNORMAL LOW (ref 4.0–10.5)
nRBC: 0 % (ref 0.0–0.2)

## 2021-11-10 LAB — IRON AND TIBC
Iron: 66 ug/dL (ref 45–182)
Saturation Ratios: 19 % (ref 17.9–39.5)
TIBC: 342 ug/dL (ref 250–450)
UIBC: 276 ug/dL

## 2021-11-10 LAB — FERRITIN: Ferritin: 28 ng/mL (ref 24–336)

## 2021-11-10 NOTE — Progress Notes (Signed)
? ?Virtual Visit via Telephone Note ?[facility] ? ?I connected with Kyle Daniel  on 11/11/21 at  8:37 AM by telephone and verified that I am speaking with the correct person using two identifiers. ? ?Location: ?Patient: Home ?Provider: [facility] ?  ?I discussed the limitations, risks, security and privacy concerns of performing an evaluation and management service by telephone and the availability of in person appointments. I also discussed with the patient that there may be a patient responsible charge related to this service. The patient expressed understanding and agreed to proceed. ? ? ?REASON FOR VISIT:  ?Follow-up for iron deficiency anemia ?  ?PRIOR THERAPY: PRBC transfusions ?  ?CURRENT THERAPY: Intermittent IV iron (most recent Feraheme on 08/18/2021 & 08/25/2021) ?  ?INTERVAL HISTORY:  ?Kyle Daniel 76 y.o. male returns for routine follow-up of his iron deficiency anemia.  He was last evaluated by Rojelio Brenner PA-C via telemedicine visit on 08/12/2021. ?  ?At today's visit, he reports feeling fair.  No recent hospitalizations, surgeries, or changes in baseline health status.  He has had chronic melena with black tarry stool with most bowel movements.  However, he reports that his black bowel movements seem to have stopped last week, and he is hopeful that they will not return.  He denies any gross hematochezia or hematemesis.  He has an appointment with gastroenterology next month. ? ?He continues to have chronic fatigue.  He has some lightheadedness with standing.  He denies any recent chest pain or dyspnea on exertion.  No pica, restless legs, or syncope. ?  ?He has 75% energy and 100% appetite. He endorses that he is maintaining a stable weight. ? ?  ?OBSERVATIONS/OBJECTIVE: ?Review of Systems  ?Constitutional:  Negative for chills, diaphoresis, fever, malaise/fatigue and weight loss.  ?Respiratory:  Positive for cough. Negative for shortness of breath.   ?Cardiovascular:   Positive for leg swelling. Negative for chest pain and palpitations.  ?Gastrointestinal:  Negative for abdominal pain, blood in stool, melena, nausea and vomiting.  ?Genitourinary:  Positive for frequency.  ?Musculoskeletal:  Positive for back pain and joint pain.  ?Neurological:  Negative for dizziness and headaches.  ?Psychiatric/Behavioral:  Positive for depression. The patient is nervous/anxious.    ? ?PHYSICAL EXAM (per limitations of virtual telephone visit): The patient is alert and oriented x 3, exhibiting adequate mentation, good mood, and ability to speak in full sentences and execute sound judgement. ? ? ?ASSESSMENT & PLAN: ?1.  Chronic iron deficiency anemia:  ?- History of GI bleed in October 2020 at [facility] in [place], required 5 units of PRBC. ?- Admitted to the hospital from 01/27/2020 through 01/29/2020 with dizziness and dyspnea on exertion. Found to have hemoglobin 5.6, received blood transfusions. ?- EGD on 01/28/2020 and colonoscopy on 01/29/2019 were nonrevealing. ?- Capsule study on 03/12/2020, stomach with several images of blood flecks or wisps of blood without obvious source.  Multiple images of duodenal, proximal jejunal areas with what appears lymphangiectasia's without active bleeding.  Further along in the study, images with possible erosions/ulcers. ?- Other nutritional deficiency work-up was negative.  SPEP was negative. ?- Feraheme infusion most recently on 08/18/2021 & 08/25/2021 ?- Most recent labs (11/10/2021):  Hgb 12.4/MCV 88.1, ferritin 28, iron saturation 19% ?- Most recent melena was approximately 1 week ago ?- Symptomatic with mild to moderate fatigue   ?- He is taking B12 and iron supplements at home (stools were dark even before starting iron)  ?- PLAN: Recommend IV Feraheme  x2. ?- We will repeat CBC/iron panel in 3 months with phone visit ?- Continue to follow with gastroenterologists. (Re-referral sent to Dr. Jena Gauss - patient was lost to follow-up, has appointment to be seen at [facility]  in May 2023) ?- Patient aware of alarm symptoms that would prompt immediate medical attention at the ED. ?** Plan on in-person office visit at least once per year - otherwise phone visit is fine.  Last in-person visit was on 05/11/2021. ? ?2.  Smoking history: ?- Reviewed results of CT chest from 05/26/2020 which was lung RADS 1. ?- CT chest/low-dose LCS (09/21/2021): Lung-RADS 1, negative ?- PLAN: He will be due for next annual LDCT in February 2024 ?  ?3.  Social/family history: ?- Active smoker for 56 years, 1.5 PPD current smoker ?- Sisters x 2 with breast cancer ? ? ?FOLLOW UP INSTRUCTIONS: ?Feraheme x2 ?Labs and phone visit in 3 months ? ?  ?I discussed the assessment and treatment plan with the patient. The patient was provided an opportunity to ask questions and all were answered. The patient agreed with the plan and demonstrated an understanding of the instructions. ?  ?The patient was advised to call back or seek an in-person evaluation if the symptoms worsen or if the condition fails to improve as anticipated. ? ?I provided 22 minutes of non-face-to-face time during this encounter. ? ? ?Carnella Guadalajara, PA-C ?11/11/21 8:59 AM ?

## 2021-11-11 ENCOUNTER — Other Ambulatory Visit (HOSPITAL_COMMUNITY): Payer: Medicare Other

## 2021-11-11 ENCOUNTER — Inpatient Hospital Stay (HOSPITAL_BASED_OUTPATIENT_CLINIC_OR_DEPARTMENT_OTHER): Payer: Medicare Other | Admitting: Physician Assistant

## 2021-11-11 DIAGNOSIS — D500 Iron deficiency anemia secondary to blood loss (chronic): Secondary | ICD-10-CM | POA: Diagnosis not present

## 2021-11-16 ENCOUNTER — Encounter (HOSPITAL_COMMUNITY): Payer: Self-pay

## 2021-11-16 ENCOUNTER — Inpatient Hospital Stay (HOSPITAL_COMMUNITY): Payer: Medicare Other

## 2021-11-16 VITALS — BP 124/60 | HR 67 | Temp 98.0°F | Resp 18 | Ht 73.0 in | Wt 260.3 lb

## 2021-11-16 DIAGNOSIS — D500 Iron deficiency anemia secondary to blood loss (chronic): Secondary | ICD-10-CM

## 2021-11-16 DIAGNOSIS — D509 Iron deficiency anemia, unspecified: Secondary | ICD-10-CM | POA: Diagnosis not present

## 2021-11-16 DIAGNOSIS — K922 Gastrointestinal hemorrhage, unspecified: Secondary | ICD-10-CM

## 2021-11-16 MED ORDER — ACETAMINOPHEN 325 MG PO TABS
650.0000 mg | ORAL_TABLET | Freq: Once | ORAL | Status: AC
  Administered 2021-11-16: 650 mg via ORAL
  Filled 2021-11-16: qty 2

## 2021-11-16 MED ORDER — LORATADINE 10 MG PO TABS
10.0000 mg | ORAL_TABLET | Freq: Once | ORAL | Status: AC
  Administered 2021-11-16: 10 mg via ORAL
  Filled 2021-11-16: qty 1

## 2021-11-16 MED ORDER — SODIUM CHLORIDE 0.9 % IV SOLN
510.0000 mg | Freq: Once | INTRAVENOUS | Status: AC
  Administered 2021-11-16: 510 mg via INTRAVENOUS
  Filled 2021-11-16: qty 510

## 2021-11-16 MED ORDER — SODIUM CHLORIDE 0.9 % IV SOLN: Freq: Once | INTRAVENOUS | Status: AC

## 2021-11-16 NOTE — Patient Instructions (Signed)
[facility]  Discharge Instructions: ?Thank you for choosing [facility] to provide your oncology and hematology care.  ?If you have a lab appointment with the Cancer Center, please come in thru the Main Entrance and check in at the main information desk. ? ?Wear comfortable clothing and clothing appropriate for easy access to any Portacath or PICC line.  ? ?We strive to give you quality time with your provider. You may need to reschedule your appointment if you arrive late (15 or more minutes).  Arriving late affects you and other patients whose appointments are after yours.  Also, if you miss three or more appointments without notifying the office, you may be dismissed from the clinic at the provider?s discretion.    ?  ?For prescription refill requests, have your pharmacy contact our office and allow 72 hours for refills to be completed.   ? ?Today you received the following Feraheme, return as scheduled. ?  ?To help prevent nausea and vomiting after your treatment, we encourage you to take your nausea medication as directed. ? ?BELOW ARE SYMPTOMS THAT SHOULD BE REPORTED IMMEDIATELY: ?*FEVER GREATER THAN 100.4 F (38 ?C) OR HIGHER ?*CHILLS OR SWEATING ?*NAUSEA AND VOMITING THAT IS NOT CONTROLLED WITH YOUR NAUSEA MEDICATION ?*UNUSUAL SHORTNESS OF BREATH ?*UNUSUAL BRUISING OR BLEEDING ?*URINARY PROBLEMS (pain or burning when urinating, or frequent urination) ?*BOWEL PROBLEMS (unusual diarrhea, constipation, pain near the anus) ?TENDERNESS IN MOUTH AND THROAT WITH OR WITHOUT PRESENCE OF ULCERS (sore throat, sores in mouth, or a toothache) ?UNUSUAL RASH, SWELLING OR PAIN  ?UNUSUAL VAGINAL DISCHARGE OR ITCHING  ? ?Items with * indicate a potential emergency and should be followed up as soon as possible or go to the Emergency Department if any problems should occur. ? ?Please show the CHEMOTHERAPY ALERT CARD or IMMUNOTHERAPY ALERT CARD at check-in to the Emergency Department and triage  nurse. ? ?Should you have questions after your visit or need to cancel or reschedule your appointment, please contact [facility] [phone]  and follow the prompts.  Office hours are 8:00 a.m. to 4:30 p.m. Monday - Friday. Please note that voicemails left after 4:00 p.m. may not be returned until the following business day.  We are closed weekends and major holidays. You have access to a nurse at all times for urgent questions. Please call the main number to the clinic [phone] and follow the prompts. ? ?For any non-urgent questions, you may also contact your provider using MyChart. We now offer e-Visits for anyone 18 and older to request care online for non-urgent symptoms. For details visit [email or web address]. ?  ?Also download the MyChart app! Go to the app store, search "MyChart", open the app, select [facility], and log in with your MyChart username and password. ? ?Due to Covid, a mask is required upon entering the hospital/clinic. If you do not have a mask, one will be given to you upon arrival. For doctor visits, patients may have 1 support person aged 18 or older with them. For treatment visits, patients cannot have anyone with them due to current Covid guidelines and our immunocompromised population.  ?

## 2021-11-16 NOTE — Progress Notes (Signed)
Patient tolerated iron infusion with no complaints voiced.  Peripheral IV site clean and dry with good blood return noted before and after infusion.  Band aid applied.  VSS with discharge and left in satisfactory condition with no s/s of distress noted.   

## 2021-11-18 ENCOUNTER — Telehealth (HOSPITAL_COMMUNITY): Payer: Medicare Other | Admitting: Physician Assistant

## 2021-11-23 ENCOUNTER — Inpatient Hospital Stay (HOSPITAL_COMMUNITY): Payer: Medicare Other | Attending: Hematology

## 2021-11-23 VITALS — BP 137/65 | HR 67 | Temp 97.0°F | Resp 18

## 2021-11-23 DIAGNOSIS — D509 Iron deficiency anemia, unspecified: Secondary | ICD-10-CM | POA: Insufficient documentation

## 2021-11-23 DIAGNOSIS — D500 Iron deficiency anemia secondary to blood loss (chronic): Secondary | ICD-10-CM

## 2021-11-23 DIAGNOSIS — K922 Gastrointestinal hemorrhage, unspecified: Secondary | ICD-10-CM

## 2021-11-23 MED ORDER — SODIUM CHLORIDE 0.9 % IV SOLN: Freq: Once | INTRAVENOUS | Status: AC

## 2021-11-23 MED ORDER — SODIUM CHLORIDE 0.9 % IV SOLN
510.0000 mg | Freq: Once | INTRAVENOUS | Status: AC
  Administered 2021-11-23: 510 mg via INTRAVENOUS
  Filled 2021-11-23: qty 510

## 2021-11-23 MED ORDER — ACETAMINOPHEN 325 MG PO TABS
650.0000 mg | ORAL_TABLET | Freq: Once | ORAL | Status: AC
  Administered 2021-11-23: 650 mg via ORAL
  Filled 2021-11-23: qty 2

## 2021-11-23 MED ORDER — LORATADINE 10 MG PO TABS
10.0000 mg | ORAL_TABLET | Freq: Once | ORAL | Status: AC
  Administered 2021-11-23: 10 mg via ORAL
  Filled 2021-11-23: qty 1

## 2021-11-23 NOTE — Progress Notes (Signed)
Patient presents today for Feraheme infusion per provider order.  Vital signs WNL.  Patient has no new complaints at this time. ? ?Peripheral IV started and blood return noted pre and post infusion. ? ?Feraheme given today per MD orders.  Stable during infusion without adverse affects.  Vital signs stable.  Patient refuses to wait his thirty minute post infusion wait time.  No complaints at this time.  Discharge from clinic ambulatory in stable condition.  Alert and oriented X 3.  Follow up with [facility] as scheduled.  ?

## 2021-11-23 NOTE — Patient Instructions (Signed)
[facility]  Discharge Instructions: °Thank you for choosing [facility] to provide your oncology and hematology care.  °If you have a lab appointment with the Cancer Center, please come in thru the Main Entrance and check in at the main information desk. ° °Wear comfortable clothing and clothing appropriate for easy access to any Portacath or PICC line.  ° °We strive to give you quality time with your provider. You may need to reschedule your appointment if you arrive late (15 or more minutes).  Arriving late affects you and other patients whose appointments are after yours.  Also, if you miss three or more appointments without notifying the office, you may be dismissed from the clinic at the provider’s discretion.    °  °For prescription refill requests, have your pharmacy contact our office and allow 72 hours for refills to be completed.   ° °Today you received the following chemotherapy and/or immunotherapy agents Feraheme    °  °To help prevent nausea and vomiting after your treatment, we encourage you to take your nausea medication as directed. ° °BELOW ARE SYMPTOMS THAT SHOULD BE REPORTED IMMEDIATELY: °*FEVER GREATER THAN 100.4 F (38 °C) OR HIGHER °*CHILLS OR SWEATING °*NAUSEA AND VOMITING THAT IS NOT CONTROLLED WITH YOUR NAUSEA MEDICATION °*UNUSUAL SHORTNESS OF BREATH °*UNUSUAL BRUISING OR BLEEDING °*URINARY PROBLEMS (pain or burning when urinating, or frequent urination) °*BOWEL PROBLEMS (unusual diarrhea, constipation, pain near the anus) °TENDERNESS IN MOUTH AND THROAT WITH OR WITHOUT PRESENCE OF ULCERS (sore throat, sores in mouth, or a toothache) °UNUSUAL RASH, SWELLING OR PAIN  °UNUSUAL VAGINAL DISCHARGE OR ITCHING  ° °Items with * indicate a potential emergency and should be followed up as soon as possible or go to the Emergency Department if any problems should occur. ° °Please show the CHEMOTHERAPY ALERT CARD or IMMUNOTHERAPY ALERT CARD at check-in to the Emergency  Department and triage nurse. ° °Should you have questions after your visit or need to cancel or reschedule your appointment, please contact [facility] [phone]  and follow the prompts.  Office hours are 8:00 a.m. to 4:30 p.m. Monday - Friday. Please note that voicemails left after 4:00 p.m. may not be returned until the following business day.  We are closed weekends and major holidays. You have access to a nurse at all times for urgent questions. Please call the main number to the clinic [phone] and follow the prompts. ° °For any non-urgent questions, you may also contact your provider using MyChart. We now offer e-Visits for anyone 18 and older to request care online for non-urgent symptoms. For details visit [email or web address]. °  °Also download the MyChart app! Go to the app store, search "MyChart", open the app, select [facility], and log in with your MyChart username and password. ° °Due to Covid, a mask is required upon entering the hospital/clinic. If you do not have a mask, one will be given to you upon arrival. For doctor visits, patients may have 1 support person aged 18 or older with them. For treatment visits, patients cannot have anyone with them due to current Covid guidelines and our immunocompromised population.  °

## 2021-12-03 IMAGING — US US CAROTID DUPLEX BILAT
1 series · 13 of 24 positions shown · non-contrast
Comparison: 05/31/2019

CLINICAL DATA: Carotid artery stenosis

Hypertension and hyperlipidemia
Diabetes
Current tobacco user
EXAM:
BILATERAL CAROTID DUPLEX ULTRASOUND
TECHNIQUE: Gray scale imaging, color Doppler and duplex ultrasound were
performed of bilateral carotid and vertebral arteries in the neck.

[Series 1: us carotid bilateral · 13 of 68 slices shown]
[im 1/68]
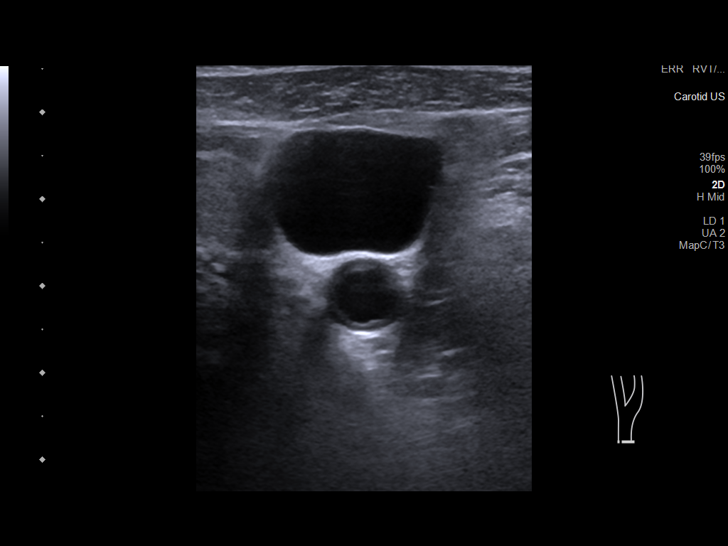
[im 6/68]
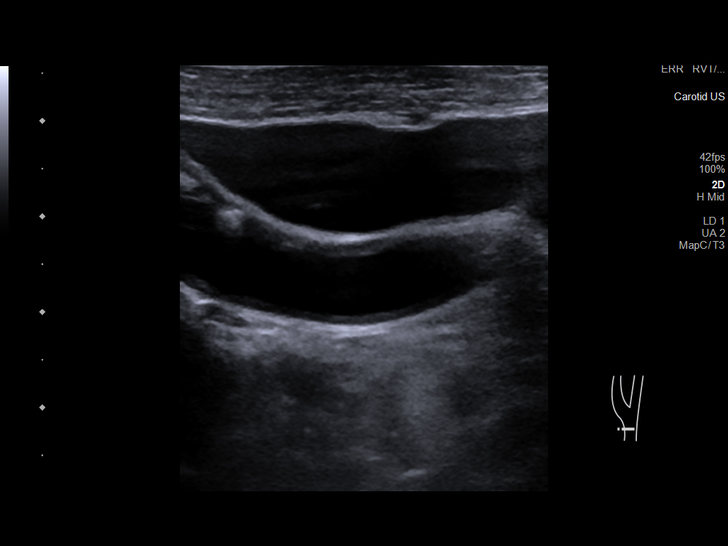
[im 12/68]
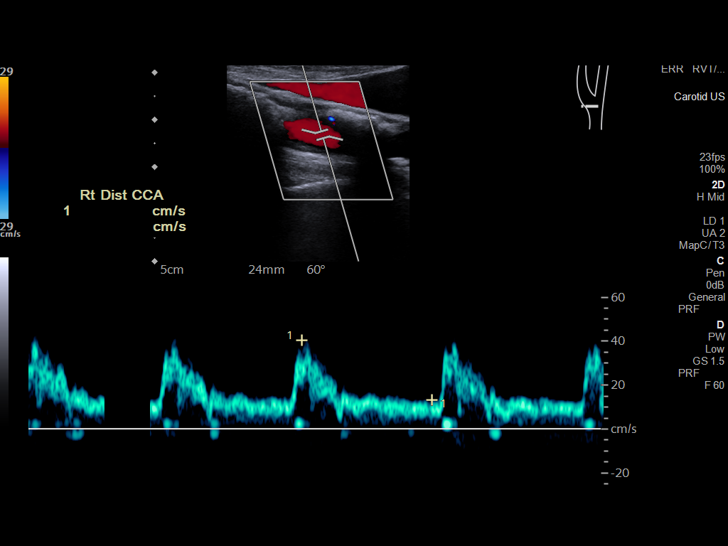
[im 18/68]
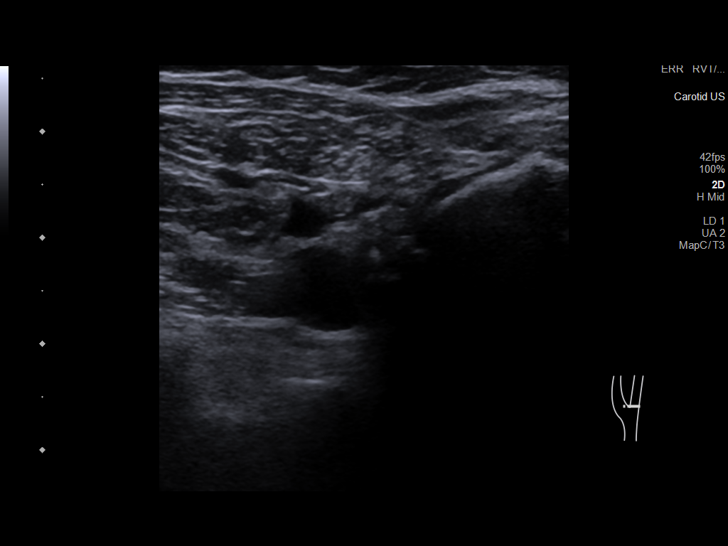
[im 24/68]
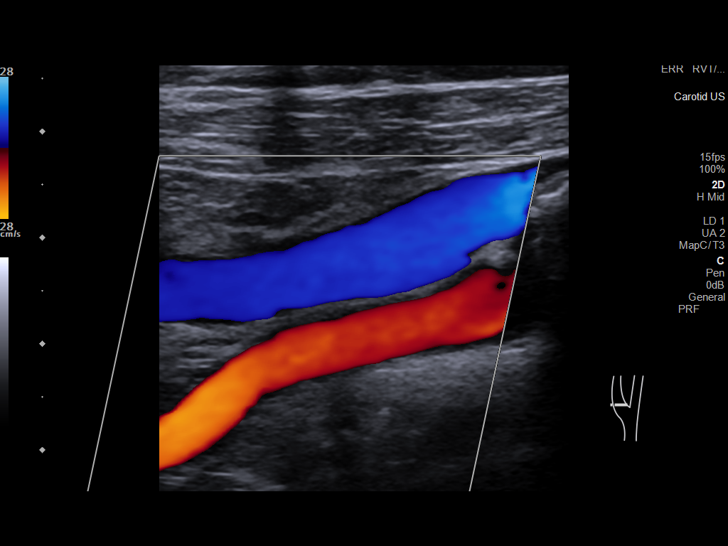
[im 30/68]
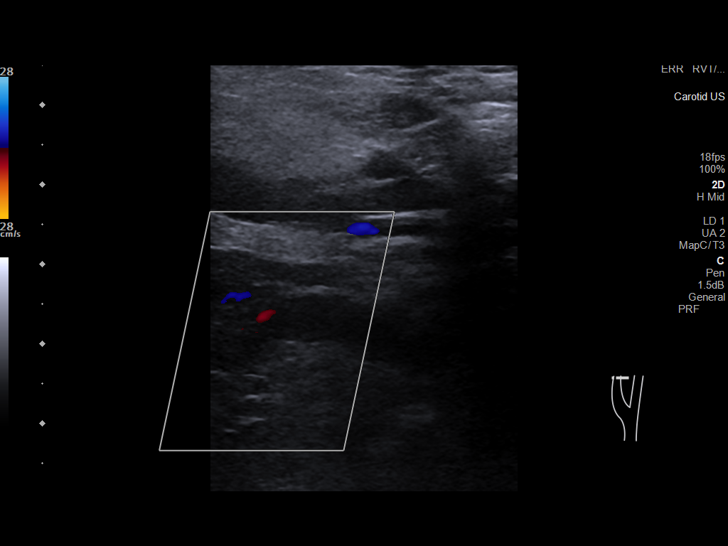
[im 35/68]
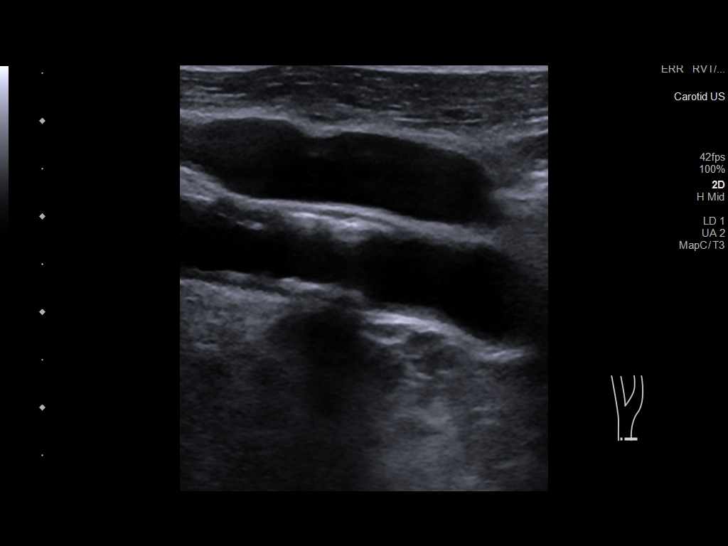
[im 38/68]
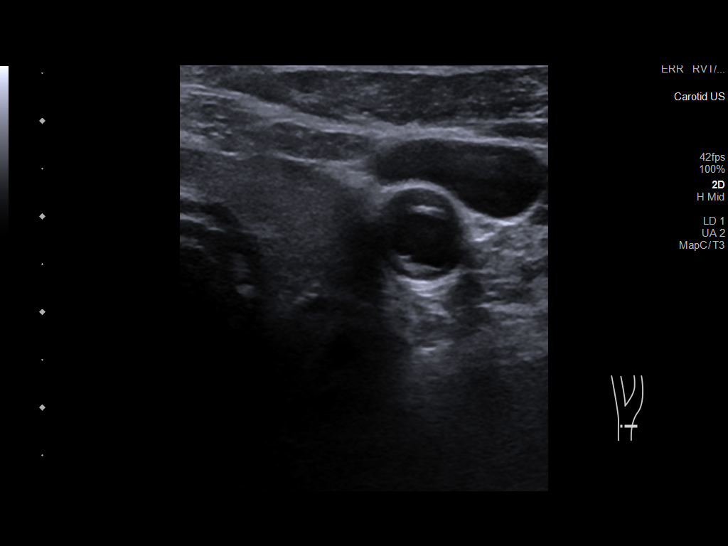
[im 44/68]
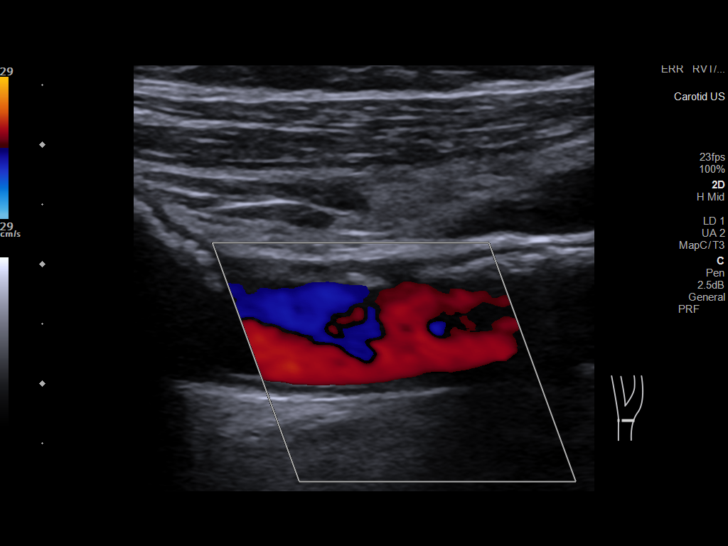
[im 50/68]
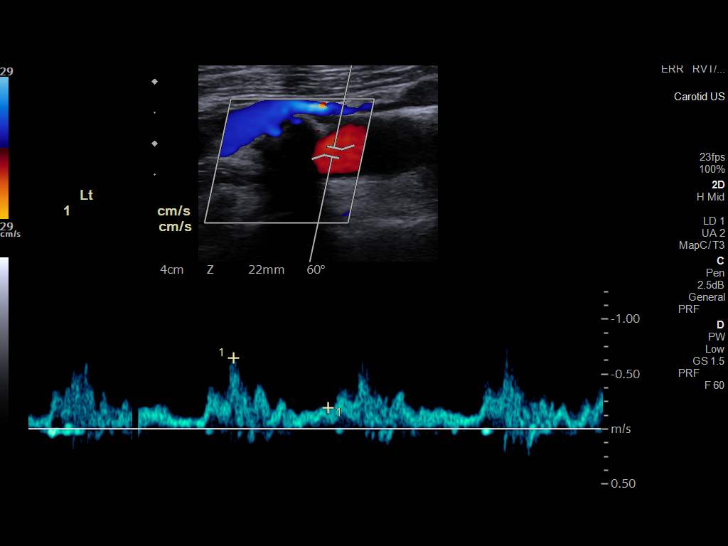
[im 56/68]
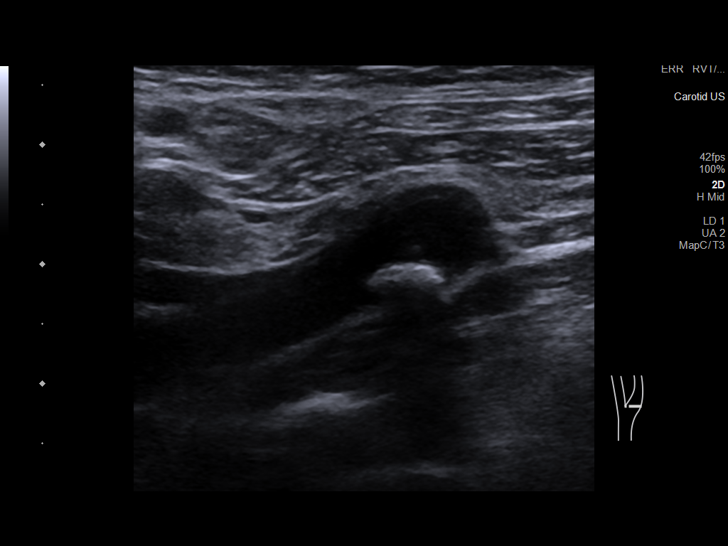
[im 62/68]
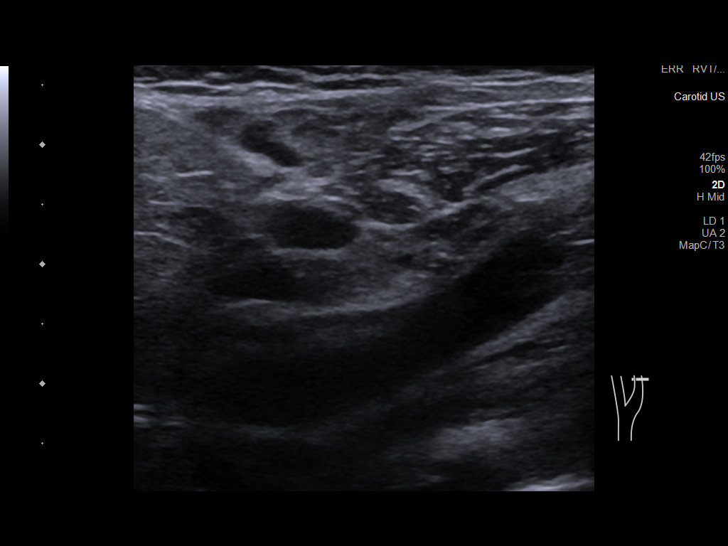
[im 68/68]
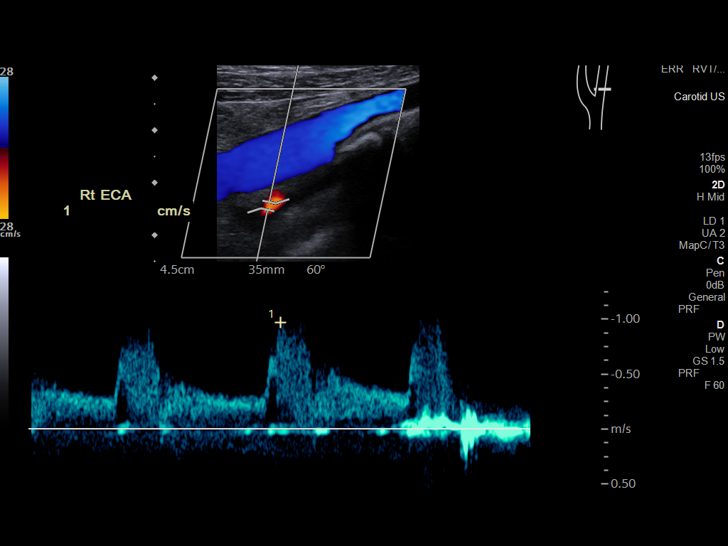

[13 of 24 positions shown; findings below may reference images not displayed]

FINDINGS: Criteria: Quantification of carotid stenosis is based on velocity
parameters that correlate the residual internal carotid diameter
with NASCET-based stenosis levels, using the diameter of the distal
internal carotid lumen as the denominator for stenosis measurement.

The following velocity measurements were obtained:

RIGHT

ICA: 113/29 cm/sec

CCA: 50/14 cm/sec

SYSTOLIC ICA/CCA RATIO:

ECA: 97 cm/sec

LEFT

ICA: 138/37 cm/sec

CCA: 93/26 cm/sec

SYSTOLIC ICA/CCA RATIO:

ECA: 326 cm/sec

RIGHT CAROTID ARTERY: Long segment heterogeneous calcified plaque of
the distal common and proximal internal carotid arteries with
ICA/CCA ratio indicative of 50-69% stenosis.

RIGHT VERTEBRAL ARTERY:  Antegrade flow.

LEFT CAROTID ARTERY: Heterogeneous calcified long segment plaque of
the distal common and proximal internal carotid arteries with
elevated peak systolic velocity suspicious for 50-69% stenosis.

LEFT VERTEBRAL ARTERY:  Antegrade flow.
IMPRESSION: At least 50-69% stenosis of the internal carotid arteries
bilaterally. There is long segment shadowing plaque within the
distal common and proximal internal carotid arteries bilaterally
which may be obscuring regions of higher velocity/stenosis. Further
evaluation with CT angiography of the neck should be considered to
more definitively characterize degree of stenosis.

These results will be called to the ordering clinician or
representative by the Radiologist Assistant, and communication
documented in the PACS or [REDACTED].

## 2021-12-06 NOTE — Progress Notes (Signed)
Referring Provider: Pearson Grippe, MD Primary Care Physician:  Pearson Grippe, MD Primary GI Physician: Dr. Marletta Lor  Chief Complaint  Patient presents with   Anemia    Patient referred from [facility] for anemia. Has some sob and fatigue at times. Has not seen any blood in stool.      HPI:   Kyle Daniel is a 76 y.o. male presenting today at the request of [facility] for rectal bleeding and history of anemia.   He has history of profound anemia in the setting of GI bleeding.  Hospitalized in October 2020 at outside hospital for GI bleeding with hemoglobin of 4.2 s/p 5 units PRBCs.  Hospitalized in July 2021 with profound anemia, heme positive stool, undergoing colonoscopy and EGD during admission without obvious source for bleeding.  Negative H. pylori on path.  Hemoglobin was 5.6 on admission and he received 3 units PRBCs.  Last seen in our office 02/19/2020.  Reported stool is dark, but not black.  No iron.  No hematochezia.  GERD well controlled on Protonix.  No other significant GI symptoms.  No NSAIDs aside from 81 mg aspirin. Reported some chest burning with exertion at times and some associated shortness of breath.  Had upcoming cardiology appointment. Recommended updating labs and capsule to complete GI evaluation.  Capsule was completed, but never left his stomach. Recommended repeating capsule with 10 mg IV Reglan 30 min before.    Capsule repeated on 03/12/2020. Stomach with several images of blood flecks or wisps of blood without obvious source. Multiple images of duodenal/proximal jejunal areas with what appear lymphangiectasias without active bleeding. Further along in the study, images with possible erosions/ulcers. Recommended strict NSAID avoidance. Stated a note would be sent to Dr. Wyline Mood to evaluate risk/benefits of his daily aspirin use. Recommended referral to hematology.   He has been following with hematology receiving iron infusions. Most recent hemoglobin 12.4 on 4/19, iron  panel on low end of normal.   Today:  No rectal bleeding. No change in bowel habits. No constipation or diarrhea. Stools are dark at times on oral iron and B12. Not completely black. No abdominal pain, N/V, GERD symptoms, or dysphagia.   No NSAIDs aside from 81 mg aspirin.  Last EGD 01/28/20: Mild erosive reflux esophagitis. Subtly abnormal gastric mucosa biopsied, normal duodenal bulb, second portion, third portion and fourth portion of the duodenum aside from minimally eroded second portion mucosa biopsied. Pathology: slight chronic gastritis, no H pylori, duodenal biopsy benign.   Last colonoscopy 01/29/20: Entirely normal.    Past Medical History:  Diagnosis Date   Anemia    ASCVD (arteriosclerotic cardiovascular disease)    S/P CABG surgery  in 2004; cardiac cath in 2005 revealed patent grafts.   BPH (benign prostatic hypertrophy)    Carotid artery occlusion    Cerebrovascular disease    bilateral bruits   Coronary artery disease    DM (diabetes mellitus) (HCC)    AODM-- insulin requiring for the past 3yrs   Hx of adenomatous colonic polyps    Hyperlipidemia    Hypertension    IDA (iron deficiency anemia)    Pneumonia 2020   Tobacco abuse    50 pack years; current 1 pack per day    Past Surgical History:  Procedure Laterality Date   BIOPSY  01/28/2020   Procedure: BIOPSY;  Surgeon: Corbin Ade, MD;  [facility];  Service: Endoscopy;;   CERVICAL DISCECTOMY  1996   anterior cervical spine  discectomy and fusion   CHOLECYSTECTOMY  06/17/2003   by Lebron Conners MD   COLONOSCOPY W/ POLYPECTOMY     4 adenomatous polyps   COLONOSCOPY WITH PROPOFOL N/A 01/29/2020   normal colon, normal appearing TI.    CORONARY ARTERY BYPASS GRAFT     ENDARTERECTOMY Right 12/08/2020   Procedure: RIGHT CAROTID ENDARTERECTOMY;  Surgeon: Larina Earthly, MD;  Location: [facility] OR;  Service: Vascular;  Laterality: Right;   ESOPHAGOGASTRODUODENOSCOPY  07/04/2007   Dr. Darrick Penna; normal  esophagus, erythema/edema in gastric antrum s/p biopsied, duodenitis.  Pathology positive for H. pylori.   ESOPHAGOGASTRODUODENOSCOPY (EGD) WITH PROPOFOL N/A 01/28/2020    mild erosive reflux esophagitis, s/p biopsy of gastric mucosa, normal duodenum overall except for minimally eroded second portion of mucosa s/p biopsy. Pathology: slight chronic gastritis, negative H.pylori, benign duodenal mucosa, no sprue.    GIVENS CAPSULE STUDY N/A 02/26/2020   Procedure: GIVENS CAPSULE STUDY;  Surgeon: Lanelle Bal, DO;  Location: [facility];  Service: Endoscopy;  Laterality: N/A;  7:30am   GIVENS CAPSULE STUDY N/A 03/12/2020   Surgeon: Corbin Ade, MD;  blood flecks/wisp of blood in the stomach without obvious source, possible erosions/ulcers in the small bowel, but no active bleeding.   PATCH ANGIOPLASTY Right 12/08/2020   Procedure: PATCH ANGIOPLASTY HEMASHIELD TO RIGHT INTERNAL CAROTID ARTERY;  Surgeon: Larina Earthly, MD;  Location: [facility] OR;  Service: Vascular;  Laterality: Right;    Current Outpatient Medications  Medication Sig Dispense Refill   albuterol (VENTOLIN HFA) 108 (90 Base) MCG/ACT inhaler Inhale 1-2 puffs into the lungs every 6 (six) hours as needed for wheezing or shortness of breath.     Alcohol Swabs (ALCOHOL WIPES) 70 % PADS Apply topically.     amitriptyline (ELAVIL) 25 MG tablet Take 25 mg by mouth at bedtime.     amLODipine (NORVASC) 10 MG tablet Take 1 tablet (10 mg total) by mouth daily. 30 tablet 5   aspirin 81 MG tablet Take 1 tablet (81 mg total) by mouth daily with breakfast. 30 tablet 1   cholecalciferol (VITAMIN D3) 25 MCG (1000 UNIT) tablet Take 1,000 Units by mouth every morning.     dorzolamide-timolol (COSOPT) 22.3-6.8 MG/ML ophthalmic solution Place 1 drop into the right eye in the morning.     ferrous sulfate 325 (65 FE) MG tablet Take 325 mg by mouth daily with breakfast.     finasteride (PROSCAR) 5 MG tablet Take 5 mg by mouth at bedtime.     gabapentin  (NEURONTIN) 100 MG capsule Take 200 mg by mouth 2 (two) times daily.      insulin glargine (LANTUS) 100 UNIT/ML injection Inject 28 Units into the skin in the morning and at bedtime.     losartan (COZAAR) 25 MG tablet Take 1 tablet (25 mg total) by mouth daily. 90 tablet 2   Omega-3 Fatty Acids (FISH OIL) 1000 MG CAPS Take 3 capsules by mouth 2 (two) times daily before a meal.     oxyCODONE-acetaminophen (PERCOCET) 5-325 MG tablet Take 1 tablet by mouth every 6 (six) hours as needed for severe pain. 8 tablet 0   pravastatin (PRAVACHOL) 80 MG tablet Take 1 tablet (80 mg total) by mouth daily. 90 tablet 1   prazosin (MINIPRESS) 2 MG capsule Take 2 mg by mouth at bedtime.     torsemide (DEMADEX) 20 MG tablet Take 1 tablet by mouth daily.     traZODone (DESYREL) 50 MG tablet Take 50 mg by mouth  at bedtime.     insulin glargine-yfgn (SEMGLEE) 100 UNIT/ML injection INJECT 28 UNITS UNDER SKIN TWO TIMES A DAY FOR DIABETES MELLITUS DO NOT MIX WITH OTHER INSULINS IN THE SAME SYRINGE-DISCARD BOTTLE 28 DAYS AFTER OPENING **REPLACES LANTUS** (Patient not taking: Reported on 12/08/2021)     nitroGLYCERIN (NITROSTAT) 0.4 MG SL tablet Place 1 tablet (0.4 mg total) under the tongue every 5 (five) minutes as needed for chest pain. (Patient not taking: Reported on 12/08/2021) 25 tablet 3   omeprazole (PRILOSEC) 20 MG capsule Take 1 capsule (20 mg total) by mouth daily. 90 capsule 3   No current facility-administered medications for this visit.    Allergies as of 12/08/2021 - Review Complete 12/08/2021  Allergen Reaction Noted   Lisinopril Cough 03/20/2007    Family History  Problem Relation Age of Onset   Pneumonia Mother 60       complications of diabetes   Diabetes Mother    Pneumonia Father 3       complication of pneumonia   Cancer Sister        breast   Cancer Sister        breast cancer   Colon cancer Neg Hx     Social History   Socioeconomic History   Marital status: Married    Spouse name:  valeria   Number of children: 3   Years of education: Not on file   Highest education level: Not on file  Occupational History   Occupation: disabled  Tobacco Use   Smoking status: Every Day    Packs/day: 1.50    Years: 50.00    Pack years: 75.00    Types: Cigarettes    Start date: 09/05/1960    Passive exposure: Current   Smokeless tobacco: Never  Vaping Use   Vaping Use: Never used  Substance and Sexual Activity   Alcohol use: No    Comment: quit 1980's   Drug use: No   Sexual activity: Not on file  Other Topics Concern   Not on file  Social History Narrative   Not on file   Social Determinants of Health   Financial Resource Strain: Not on file  Food Insecurity: Not on file  Transportation Needs: Not on file  Physical Activity: Not on file  Stress: Not on file  Social Connections: Not on file    Review of Systems: Gen: Denies fever, chills, cold or flulike symptoms, presyncope, syncope. CV: Denies chest pain or palpitations.  Resp: Denies dyspnea or cough.  GI: See HPI Heme: See HPI  Physical Exam: BP 138/74 (BP Location: Left Arm, Patient Position: Sitting, Cuff Size: Large)   Pulse 82   Temp (!) 97.1 F (36.2 C) (Oral)   Ht 6\' 1"  (1.854 m)   Wt 257 lb (116.6 kg)   BMI 33.91 kg/m  General:   Alert and oriented. No distress noted. Pleasant and cooperative.  Head:  Normocephalic and atraumatic. Eyes:  Conjuctiva clear without scleral icterus. Heart:  S1, S2 present without murmurs appreciated. Lungs:  Clear to auscultation bilaterally. No wheezes, rales, or rhonchi. No distress.  Abdomen:  +BS, soft, non-tender and non-distended. No rebound or guarding. No HSM or masses noted. Msk:  Symmetrical without gross deformities. Normal posture. Extremities:  Without edema. Neurologic:  Alert and  oriented x4 Psych:  Normal mood and affect.    Assessment:  76 year old male with history of profound iron deficiency anemia requiring recurrent hospitalizations in  2020 and 2021 requiring multiple PRBC transfusions  s/p complete GI evaluation in 2021 including EGD, colonoscopy, and capsule study.  EGD with mild erosive reflux esophagitis, slight chronic gastritis without H. pylori, minimally eroded second portion of the duodenum with benign biopsy.  Colonoscopy entirely normal.  Capsule study with blood flecks/wisp of blood in the stomach without obvious source, possible erosions/ulcers in the small bowel, but no active bleeding.  He was referred to hematology and has been following with them closely, receiving iron infusions as needed.  Most recent hemoglobin in April was 12.4, iron panel in the low end of normal.  [facility], but patient denies having any rectal bleeding whatsoever.  Stools are dark at times on oral iron, but no black stools.  No other significant GI symptoms.  He has not been taking a PPI.  He does continue to take 81 mg aspirin daily, but no other NSAID products.  I suspect patient is likely experiencing occult blood loss from his small bowel.  Unable to rule out oozing from the stomach or esophagus with history of gastritis, reflux esophagitis, currently off PPI.   I have recommended resuming PPI daily due to history of reflux esophagitis and ongoing aspirin use, discuss risk and benefits of discontinuing aspirin with cardiology, and continue to follow with hematology for IV iron as needed.  Plan:  Start omeprazole 20 mg daily. Patient will discuss risk and benefits of discontinuing aspirin with Dr. Wyline MoodBranch. Strict NSAID avoidance. Continue to follow closely with hematology for IV iron as needed. Patient will monitor for rectal bleeding or melena and let us know if this occurs. Follow-up in 6 months or sooner if needed.   Ermalinda MemosKristen Eriq Hufford, PA-C [facility] 12/08/2021

## 2021-12-08 ENCOUNTER — Ambulatory Visit (INDEPENDENT_AMBULATORY_CARE_PROVIDER_SITE_OTHER): Payer: Medicare Other | Admitting: Gastroenterology

## 2021-12-08 ENCOUNTER — Encounter (HOSPITAL_COMMUNITY): Payer: Self-pay | Admitting: Hematology

## 2021-12-08 ENCOUNTER — Encounter: Payer: Self-pay | Admitting: Gastroenterology

## 2021-12-08 VITALS — BP 138/74 | HR 82 | Temp 97.1°F | Ht 73.0 in | Wt 257.0 lb

## 2021-12-08 DIAGNOSIS — D509 Iron deficiency anemia, unspecified: Secondary | ICD-10-CM

## 2021-12-08 DIAGNOSIS — K2100 Gastro-esophageal reflux disease with esophagitis, without bleeding: Secondary | ICD-10-CM

## 2021-12-08 MED ORDER — OMEPRAZOLE 20 MG PO CPDR: 20.0000 mg | DELAYED_RELEASE_CAPSULE | Freq: Every day | ORAL | 3 refills | Status: DC

## 2021-12-08 MED ORDER — OMEPRAZOLE 20 MG PO CPDR: 20.0000 mg | DELAYED_RELEASE_CAPSULE | Freq: Every day | ORAL | 3 refills | Status: AC

## 2021-12-08 NOTE — Patient Instructions (Signed)
Start omeprazole 20 mg daily 30 minutes before breakfast due to your history of reflux esophagitis and daily aspirin use. ? ?You may talk with Dr. Harl Bowie about your aspirin to see if it is necessary for you to continue this. ? ?Continue to follow closely with hematology for management of your iron deficiency anemia. ? ?Continue to avoid all NSAID products including ibuprofen, Aleve, Advil, BC powders, Goody powders, and anything that says "NSAID" on the package. ? ?If you notice bright red blood per rectum or pitch black tarry stools, please let me know. ? ?We will follow-up with you in 6 months or sooner if needed. ? ?Aliene Altes, PA-C ?[facility] ? ?

## 2021-12-09 ENCOUNTER — Encounter: Payer: Self-pay | Admitting: Gastroenterology

## 2022-02-03 ENCOUNTER — Inpatient Hospital Stay (HOSPITAL_COMMUNITY): Payer: Medicare Other | Attending: Hematology

## 2022-02-03 DIAGNOSIS — D509 Iron deficiency anemia, unspecified: Secondary | ICD-10-CM | POA: Insufficient documentation

## 2022-02-10 ENCOUNTER — Telehealth (HOSPITAL_COMMUNITY): Payer: Medicare Other | Admitting: Physician Assistant

## 2022-02-15 ENCOUNTER — Inpatient Hospital Stay (HOSPITAL_COMMUNITY): Payer: Medicare Other

## 2022-02-15 DIAGNOSIS — D500 Iron deficiency anemia secondary to blood loss (chronic): Secondary | ICD-10-CM

## 2022-02-15 DIAGNOSIS — D509 Iron deficiency anemia, unspecified: Secondary | ICD-10-CM | POA: Diagnosis present

## 2022-02-15 LAB — CBC WITH DIFFERENTIAL/PLATELET
Abs Immature Granulocytes: 0.02 10*3/uL (ref 0.00–0.07)
Basophils Absolute: 0 10*3/uL (ref 0.0–0.1)
Basophils Relative: 1 %
Eosinophils Absolute: 0.1 10*3/uL (ref 0.0–0.5)
Eosinophils Relative: 2 %
HCT: 33.8 % — ABNORMAL LOW (ref 39.0–52.0)
Hemoglobin: 11.1 g/dL — ABNORMAL LOW (ref 13.0–17.0)
Immature Granulocytes: 1 %
Lymphocytes Relative: 31 %
Lymphs Abs: 1.1 10*3/uL (ref 0.7–4.0)
MCH: 29.1 pg (ref 26.0–34.0)
MCHC: 32.8 g/dL (ref 30.0–36.0)
MCV: 88.7 fL (ref 80.0–100.0)
Monocytes Absolute: 0.2 10*3/uL (ref 0.1–1.0)
Monocytes Relative: 6 %
Neutro Abs: 2 10*3/uL (ref 1.7–7.7)
Neutrophils Relative %: 59 %
Platelets: 201 10*3/uL (ref 150–400)
RBC: 3.81 MIL/uL — ABNORMAL LOW (ref 4.22–5.81)
RDW: 14.4 % (ref 11.5–15.5)
WBC: 3.4 10*3/uL — ABNORMAL LOW (ref 4.0–10.5)
nRBC: 0 % (ref 0.0–0.2)

## 2022-02-15 LAB — IRON AND TIBC
Iron: 52 ug/dL (ref 45–182)
Saturation Ratios: 17 % — ABNORMAL LOW (ref 17.9–39.5)
TIBC: 310 ug/dL (ref 250–450)
UIBC: 258 ug/dL

## 2022-02-15 LAB — FERRITIN: Ferritin: 33 ng/mL (ref 24–336)

## 2022-02-17 NOTE — Progress Notes (Signed)
Virtual Visit via Telephone Note [facility]  I connected with Kyle Daniel  on 02/18/22 at 3:10 PM by telephone and verified that I am speaking with the correct person using two identifiers.  Location: Patient: Home Provider: [facility]   I discussed the limitations, risks, security and privacy concerns of performing an evaluation and management service by telephone and the availability of in person appointments. I also discussed with the patient that there may be a patient responsible charge related to this service. The patient expressed understanding and agreed to proceed.  REASON FOR VISIT:  Follow-up for iron deficiency anemia   PRIOR THERAPY: PRBC transfusions   CURRENT THERAPY: Intermittent IV iron (most recent Feraheme on 11/16/2021 & 11/23/2021)   INTERVAL HISTORY:  Kyle Daniel 76y.o. male returns for routine follow-up of his iron deficiency anemia.  He was last evaluated by Rojelio Brenner PA-C via telemedicine visit on 11/11/2021.   At today's visit, he reports feeling fair.  No recent hospitalizations, surgeries, or changes in baseline health status.  He reports dark bowel movements from oral iron supplement, but has not had any black/tarry bowel movements for the past 2 to 3 weeks.  No gross hematochezia or hematemesis.  He does feel more weak and tired than usual, describes this as feeling "lightheaded but for his whole body."  He has some palpitations and intermittent exertional chest pain and is followed by cardiology.  He denies any dyspnea on exertion.  No pica, restless legs, or syncope.   He has 50% energy and 100% appetite. He endorses that he is maintaining a stable weight.     OBSERVATIONS/OBJECTIVE: Review of Systems  Constitutional:  Positive for malaise/fatigue. Negative for chills, diaphoresis, fever and weight loss.  Respiratory:  Positive for cough. Negative for shortness of breath.   Cardiovascular:  Positive for chest pain and  palpitations.  Gastrointestinal:  Negative for abdominal pain, blood in stool, melena, nausea and vomiting.  Neurological:  Positive for dizziness. Negative for headaches.     PHYSICAL EXAM (per limitations of virtual telephone visit): The patient is alert and oriented x 3, exhibiting adequate mentation, good mood, and ability to speak in full sentences and execute sound judgement.   ASSESSMENT & PLAN: 1.  Chronic iron deficiency anemia:  - History of GI bleed in October 2020 at [facility] in [place], required 5 units of PRBC. - Admitted to the hospital from 01/27/2020 through 01/29/2020 with dizziness and dyspnea on exertion. Found to have hemoglobin 5.6, received blood transfusions. - EGD on 01/28/2020 and colonoscopy on 01/29/2019 were nonrevealing. - Capsule study on 03/12/2020, stomach with several images of blood flecks or wisps of blood without obvious source.  Multiple images of duodenal, proximal jejunal areas with what appears lymphangiectasia's without active bleeding.  Further along in the study, images with possible erosions/ulcers. - Other nutritional deficiency work-up was negative.  SPEP was negative. - Most recent Feraheme on 11/16/2021 & 11/23/2021 - Most recent labs (02/15/2022): Hgb 11.1/MCV 88.7, ferritin 33, iron saturation 17% - Dark bowel movements from oral iron with intermittent melena, last "black and sticky" bowel movement about 2 to 3 weeks ago - Symptomatic with moderate fatigue - He is taking B12 and iron supplements at home (stools were dark even before starting iron)  - PLAN: Recommend IV Feraheme x2.   - We will repeat CBC/iron panel in 3 months with same-day OFFICE visit - Continue to follow with gastroenterologists.  - Patient aware of alarm symptoms  that would prompt immediate medical attention at the ED.   2.  Smoking history: - Reviewed results of CT chest from 05/26/2020 which was lung RADS 1. - CT chest/low-dose LCS (09/21/2021): Lung-RADS 1, negative - PLAN: He  will be due for next annual LDCT in February 2024   3.  Social/family history: - Active smoker for 56 years, 1.5 PPD current smoker - Sisters x 2 with breast cancer     FOLLOW UP INSTRUCTIONS: Feraheme x2 Labs and office visit in 3 months    I discussed the assessment and treatment plan with the patient. The patient was provided an opportunity to ask questions and all were answered. The patient agreed with the plan and demonstrated an understanding of the instructions.   The patient was advised to call back or seek an in-person evaluation if the symptoms worsen or if the condition fails to improve as anticipated.  I provided 22 minutes of non-face-to-face time during this encounter.   Carnella Guadalajara, PA-C 02/18/2022 3:46 PM

## 2022-02-18 ENCOUNTER — Inpatient Hospital Stay (HOSPITAL_BASED_OUTPATIENT_CLINIC_OR_DEPARTMENT_OTHER): Payer: Medicare Other | Admitting: Physician Assistant

## 2022-02-18 DIAGNOSIS — D500 Iron deficiency anemia secondary to blood loss (chronic): Secondary | ICD-10-CM | POA: Diagnosis not present

## 2022-02-24 ENCOUNTER — Inpatient Hospital Stay: Payer: Non-veteran care

## 2022-02-24 MED FILL — Ferumoxytol Inj 510 MG/17ML (30 MG/ML) (Elemental Fe): INTRAVENOUS | Qty: 17 | Status: AC

## 2022-03-02 ENCOUNTER — Inpatient Hospital Stay: Payer: Medicare Other | Attending: Hematology

## 2022-03-02 VITALS — BP 136/65 | HR 63 | Temp 96.7°F | Resp 16

## 2022-03-02 DIAGNOSIS — K922 Gastrointestinal hemorrhage, unspecified: Secondary | ICD-10-CM

## 2022-03-02 DIAGNOSIS — D509 Iron deficiency anemia, unspecified: Secondary | ICD-10-CM | POA: Insufficient documentation

## 2022-03-02 DIAGNOSIS — D500 Iron deficiency anemia secondary to blood loss (chronic): Secondary | ICD-10-CM

## 2022-03-02 MED ORDER — SODIUM CHLORIDE 0.9 % IV SOLN
510.0000 mg | Freq: Once | INTRAVENOUS | Status: AC
  Administered 2022-03-02: 510 mg via INTRAVENOUS
  Filled 2022-03-02: qty 17

## 2022-03-02 MED ORDER — SODIUM CHLORIDE 0.9 % IV SOLN: Freq: Once | INTRAVENOUS | Status: AC

## 2022-03-02 MED ORDER — LORATADINE 10 MG PO TABS
10.0000 mg | ORAL_TABLET | Freq: Once | ORAL | Status: AC
  Administered 2022-03-02: 10 mg via ORAL

## 2022-03-02 MED ORDER — ACETAMINOPHEN 325 MG PO TABS
650.0000 mg | ORAL_TABLET | Freq: Once | ORAL | Status: AC
  Administered 2022-03-02: 650 mg via ORAL

## 2022-03-03 ENCOUNTER — Inpatient Hospital Stay: Payer: Non-veteran care

## 2022-03-03 ENCOUNTER — Encounter (HOSPITAL_COMMUNITY): Payer: Self-pay | Admitting: Hematology

## 2022-03-03 NOTE — Patient Instructions (Signed)
[facility] [place]  Discharge Instructions: Thank you for choosing [facility] to provide your oncology and hematology care.  If you have a lab appointment with the Cancer Center, please come in thru the Main Entrance and check in at the main information desk.  Wear comfortable clothing and clothing appropriate for easy access to any Portacath or PICC line.   We strive to give you quality time with your provider. You may need to reschedule your appointment if you arrive late (15 or more minutes).  Arriving late affects you and other patients whose appointments are after yours.  Also, if you miss three or more appointments without notifying the office, you may be dismissed from the clinic at the provider's discretion.      For prescription refill requests, have your pharmacy contact our office and allow 72 hours for refills to be completed.      To help prevent nausea and vomiting after your treatment, we encourage you to take your nausea medication as directed.  BELOW ARE SYMPTOMS THAT SHOULD BE REPORTED IMMEDIATELY: *FEVER GREATER THAN 100.4 F (38 C) OR HIGHER *CHILLS OR SWEATING *NAUSEA AND VOMITING THAT IS NOT CONTROLLED WITH YOUR NAUSEA MEDICATION *UNUSUAL SHORTNESS OF BREATH *UNUSUAL BRUISING OR BLEEDING *URINARY PROBLEMS (pain or burning when urinating, or frequent urination) *BOWEL PROBLEMS (unusual diarrhea, constipation, pain near the anus) TENDERNESS IN MOUTH AND THROAT WITH OR WITHOUT PRESENCE OF ULCERS (sore throat, sores in mouth, or a toothache) UNUSUAL RASH, SWELLING OR PAIN  UNUSUAL VAGINAL DISCHARGE OR ITCHING   Items with * indicate a potential emergency and should be followed up as soon as possible or go to the Emergency Department if any problems should occur.  Please show the CHEMOTHERAPY ALERT CARD or IMMUNOTHERAPY ALERT CARD at check-in to the Emergency Department and triage nurse.  Should you have questions after your visit or need to  cancel or reschedule your appointment, please contact [facility] [place] [phone]  and follow the prompts.  Office hours are 8:00 a.m. to 4:30 p.m. Monday - Friday. Please note that voicemails left after 4:00 p.m. may not be returned until the following business day.  We are closed weekends and major holidays. You have access to a nurse at all times for urgent questions. Please call the main number to the clinic [phone] and follow the prompts.  For any non-urgent questions, you may also contact your provider using MyChart. We now offer e-Visits for anyone 18 and older to request care online for non-urgent symptoms. For details visit [email or web address].   Also download the MyChart app! Go to the app store, search "MyChart", open the app, select [place], and log in with your MyChart username and password.  Masks are optional in the cancer centers. If you would like for your care team to wear a mask while they are taking care of you, please let them know. For doctor visits, patients may have with them one support person who is at least 74 years old. At this time, visitors are not allowed in the infusion area.  

## 2022-03-03 NOTE — Progress Notes (Signed)
Feraheme infusion given per orders. Patient tolerated it well without problems. Vitals stable and discharged home from clinic ambulatory. Follow up as scheduled.  

## 2022-03-09 ENCOUNTER — Inpatient Hospital Stay: Payer: Medicare Other

## 2022-03-09 VITALS — BP 123/58 | HR 69 | Temp 96.6°F | Resp 20

## 2022-03-09 DIAGNOSIS — D500 Iron deficiency anemia secondary to blood loss (chronic): Secondary | ICD-10-CM

## 2022-03-09 DIAGNOSIS — K922 Gastrointestinal hemorrhage, unspecified: Secondary | ICD-10-CM

## 2022-03-09 DIAGNOSIS — D509 Iron deficiency anemia, unspecified: Secondary | ICD-10-CM | POA: Diagnosis not present

## 2022-03-09 MED ORDER — SODIUM CHLORIDE 0.9 % IV SOLN
510.0000 mg | Freq: Once | INTRAVENOUS | Status: AC
  Administered 2022-03-09: 510 mg via INTRAVENOUS
  Filled 2022-03-09: qty 510

## 2022-03-09 MED ORDER — LORATADINE 10 MG PO TABS
10.0000 mg | ORAL_TABLET | Freq: Once | ORAL | Status: AC
  Administered 2022-03-09: 10 mg via ORAL

## 2022-03-09 MED ORDER — ACETAMINOPHEN 325 MG PO TABS
650.0000 mg | ORAL_TABLET | Freq: Once | ORAL | Status: AC
  Administered 2022-03-09: 650 mg via ORAL

## 2022-03-09 MED ORDER — SODIUM CHLORIDE 0.9 % IV SOLN: Freq: Once | INTRAVENOUS | Status: AC

## 2022-03-09 NOTE — Progress Notes (Signed)
Pt presents today for Feraheme IV iron infusion per provider's order. Vital signs stable and pt voiced no new complaints at this time.  Peripheral IV started with good blood return pre and post infusion.  Feraheme given today per MD orders. Tolerated infusion without adverse affects. Vital signs stable. No complaints at this time. Discharged from clinic ambulatory in stable condition. Alert and oriented x 3. F/U with [facility] as scheduled.    

## 2022-03-09 NOTE — Patient Instructions (Signed)
[facility] [place]  Discharge Instructions: Thank you for choosing [facility] to provide your oncology and hematology care.  If you have a lab appointment with the Cancer Center, please come in thru the Main Entrance and check in at the main information desk.  Wear comfortable clothing and clothing appropriate for easy access to any Portacath or PICC line.   We strive to give you quality time with your provider. You may need to reschedule your appointment if you arrive late (15 or more minutes).  Arriving late affects you and other patients whose appointments are after yours.  Also, if you miss three or more appointments without notifying the office, you may be dismissed from the clinic at the provider's discretion.      For prescription refill requests, have your pharmacy contact our office and allow 72 hours for refills to be completed.    Today you received Feraheme IV iron.     BELOW ARE SYMPTOMS THAT SHOULD BE REPORTED IMMEDIATELY: *FEVER GREATER THAN 100.4 F (38 C) OR HIGHER *CHILLS OR SWEATING *NAUSEA AND VOMITING THAT IS NOT CONTROLLED WITH YOUR NAUSEA MEDICATION *UNUSUAL SHORTNESS OF BREATH *UNUSUAL BRUISING OR BLEEDING *URINARY PROBLEMS (pain or burning when urinating, or frequent urination) *BOWEL PROBLEMS (unusual diarrhea, constipation, pain near the anus) TENDERNESS IN MOUTH AND THROAT WITH OR WITHOUT PRESENCE OF ULCERS (sore throat, sores in mouth, or a toothache) UNUSUAL RASH, SWELLING OR PAIN  UNUSUAL VAGINAL DISCHARGE OR ITCHING   Items with * indicate a potential emergency and should be followed up as soon as possible or go to the Emergency Department if any problems should occur.  Please show the CHEMOTHERAPY ALERT CARD or IMMUNOTHERAPY ALERT CARD at check-in to the Emergency Department and triage nurse.  Should you have questions after your visit or need to cancel or reschedule your appointment, please contact [facility]   [phone]  and follow the prompts.  Office hours are 8:00 a.m. to 4:30 p.m. Monday - Friday. Please note that voicemails left after 4:00 p.m. may not be returned until the following business day.  We are closed weekends and major holidays. You have access to a nurse at all times for urgent questions. Please call the main number to the clinic [phone] and follow the prompts.  For any non-urgent questions, you may also contact your provider using MyChart. We now offer e-Visits for anyone 18 and older to request care online for non-urgent symptoms. For details visit [email or web address].   Also download the MyChart app! Go to the app store, search "MyChart", open the app, select [place], and log in with your MyChart username and password.  Masks are optional in the cancer centers. If you would like for your care team to wear a mask while they are taking care of you, please let them know. You may have one support person who is at least 74 years old accompany you for your appointments.  

## 2022-03-31 ENCOUNTER — Encounter (HOSPITAL_COMMUNITY): Payer: Self-pay | Admitting: Hematology

## 2022-04-14 ENCOUNTER — Encounter: Payer: Self-pay | Admitting: Cardiology

## 2022-04-14 ENCOUNTER — Inpatient Hospital Stay: Payer: Non-veteran care | Attending: Cardiology | Admitting: Cardiology

## 2022-04-14 VITALS — BP 130/64 | HR 75 | Ht 73.0 in | Wt 257.6 lb

## 2022-04-14 DIAGNOSIS — I2510 Atherosclerotic heart disease of native coronary artery without angina pectoris: Secondary | ICD-10-CM | POA: Diagnosis not present

## 2022-04-14 DIAGNOSIS — I10 Essential (primary) hypertension: Secondary | ICD-10-CM | POA: Diagnosis not present

## 2022-04-14 DIAGNOSIS — E782 Mixed hyperlipidemia: Secondary | ICD-10-CM

## 2022-04-14 NOTE — Patient Instructions (Signed)
Medication Instructions:  Your physician recommends that you continue on your current medications as directed. Please refer to the Current Medication list given to you today.   Labwork: None  Testing/Procedures: None  Follow-Up: Follow up with Dr. Branch in 6 months.   Any Other Special Instructions Will Be Listed Below (If Applicable).     If you need a refill on your cardiac medications before your next appointment, please call your pharmacy.  

## 2022-04-14 NOTE — Progress Notes (Signed)
Clinical Summary Kyle Daniel is a 76 y.o.male seen today for follow up of the following medical problems.    1. CAD - history of CABG - underwent three-vessel coronary artery bypass graft surgery on 12/18/2002 with a LIMA to the LAD, left radial to the ramus intermedius, and SVG to circumflex marginal Kyle Daniel.   2020 nuclear stress: mild ischemia       - no exertional chest pains.  -compliant with meds       2. Palpitations - no recent symptoms.  -previous high caffeine intake.      3. HTN - compliant with meds       4. Hyperlipidemia  11/2020 TC 102 TG 44 HDL 36 LDL 57 - has been on pravastatin 80mg  daily.  - upcoming labs with pcp   5. Cartoid stenosis. - followed by vascular - prior right CEA May 2022   5. Anemia, heme + stool - July 2021 Hgb 5.6 on admission. colonoscopy and EGD during admission without obvious source for bleeding - followed by GI and hematology -remains on IV iron.      Past Medical History:  Diagnosis Date   Anemia    ASCVD (arteriosclerotic cardiovascular disease)    S/P CABG surgery  in 2004; cardiac cath in 2005 revealed patent grafts.   BPH (benign prostatic hypertrophy)    Carotid artery occlusion    Cerebrovascular disease    bilateral bruits   Coronary artery disease    DM (diabetes mellitus) (HCC)    AODM-- insulin requiring for the past 95yrs   Hx of adenomatous colonic polyps    Hyperlipidemia    Hypertension    IDA (iron deficiency anemia)    Pneumonia 2020   Tobacco abuse    50 pack years; current 1 pack per day     Allergies  Allergen Reactions   Lisinopril Cough     Current Outpatient Medications  Medication Sig Dispense Refill   albuterol (VENTOLIN HFA) 108 (90 Base) MCG/ACT inhaler Inhale 1-2 puffs into the lungs every 6 (six) hours as needed for wheezing or shortness of breath.     Alcohol Swabs (ALCOHOL WIPES) 70 % PADS Apply topically.     amitriptyline (ELAVIL) 25 MG tablet Take 25 mg by mouth at  bedtime.     amLODipine (NORVASC) 10 MG tablet Take 1 tablet (10 mg total) by mouth daily. 30 tablet 5   aspirin 81 MG tablet Take 1 tablet (81 mg total) by mouth daily with breakfast. 30 tablet 1   cholecalciferol (VITAMIN D3) 25 MCG (1000 UNIT) tablet Take 1,000 Units by mouth every morning.     dorzolamide-timolol (COSOPT) 22.3-6.8 MG/ML ophthalmic solution Place 1 drop into the right eye in the morning.     ferrous sulfate 325 (65 FE) MG tablet Take 325 mg by mouth daily with breakfast.     finasteride (PROSCAR) 5 MG tablet Take 5 mg by mouth at bedtime.     gabapentin (NEURONTIN) 100 MG capsule Take 200 mg by mouth 2 (two) times daily.      insulin glargine (LANTUS) 100 UNIT/ML injection Inject 28 Units into the skin in the morning and at bedtime.     insulin glargine-yfgn (SEMGLEE) 100 UNIT/ML injection      losartan (COZAAR) 25 MG tablet Take 1 tablet (25 mg total) by mouth daily. 90 tablet 2   nitroGLYCERIN (NITROSTAT) 0.4 MG SL tablet Place 1 tablet (0.4 mg total) under the tongue every 5 (five)  minutes as needed for chest pain. (Patient not taking: Reported on 12/08/2021) 25 tablet 3   Omega-3 Fatty Acids (FISH OIL) 1000 MG CAPS Take 3 capsules by mouth 2 (two) times daily before a meal.     omeprazole (PRILOSEC) 20 MG capsule Take 1 capsule (20 mg total) by mouth daily. 90 capsule 3   oxyCODONE-acetaminophen (PERCOCET) 5-325 MG tablet Take 1 tablet by mouth every 6 (six) hours as needed for severe pain. 8 tablet 0   pravastatin (PRAVACHOL) 80 MG tablet Take 1 tablet (80 mg total) by mouth daily. 90 tablet 1   prazosin (MINIPRESS) 2 MG capsule Take 2 mg by mouth at bedtime.     torsemide (DEMADEX) 20 MG tablet Take 1 tablet by mouth daily.     traZODone (DESYREL) 50 MG tablet Take 50 mg by mouth at bedtime.     No current facility-administered medications for this visit.     Past Surgical History:  Procedure Laterality Date   BIOPSY  01/28/2020   Procedure: BIOPSY;  Surgeon:  Corbin Ade, MD;  Location: [facility];  Service: Endoscopy;;   CERVICAL DISCECTOMY  1996   anterior cervical spine discectomy and fusion   CHOLECYSTECTOMY  06/17/2003   by Lebron Conners MD   COLONOSCOPY W/ POLYPECTOMY     4 adenomatous polyps   COLONOSCOPY WITH PROPOFOL N/A 01/29/2020   normal colon, normal appearing TI.    CORONARY ARTERY BYPASS GRAFT     ENDARTERECTOMY Right 12/08/2020   Procedure: RIGHT CAROTID ENDARTERECTOMY;  Surgeon: Larina Earthly, MD;  Location: [facility] OR;  Service: Vascular;  Laterality: Right;   ESOPHAGOGASTRODUODENOSCOPY  07/04/2007   Dr. Darrick Penna; normal esophagus, erythema/edema in gastric antrum s/p biopsied, duodenitis.  Pathology positive for H. pylori.   ESOPHAGOGASTRODUODENOSCOPY (EGD) WITH PROPOFOL N/A 01/28/2020    mild erosive reflux esophagitis, s/p biopsy of gastric mucosa, normal duodenum overall except for minimally eroded second portion of mucosa s/p biopsy. Pathology: slight chronic gastritis, negative H.pylori, benign duodenal mucosa, no sprue.    GIVENS CAPSULE STUDY N/A 02/26/2020   Procedure: GIVENS CAPSULE STUDY;  Surgeon: Lanelle Bal, DO;  Location: [facility];  Service: Endoscopy;  Laterality: N/A;  7:30am   GIVENS CAPSULE STUDY N/A 03/12/2020   Surgeon: Corbin Ade, MD;  blood flecks/wisp of blood in the stomach without obvious source, possible erosions/ulcers in the small bowel, but no active bleeding.   PATCH ANGIOPLASTY Right 12/08/2020   Procedure: PATCH ANGIOPLASTY HEMASHIELD TO RIGHT INTERNAL CAROTID ARTERY;  Surgeon: Larina Earthly, MD;  Location: [facility] OR;  Service: Vascular;  Laterality: Right;     Allergies  Allergen Reactions   Lisinopril Cough      Family History  Problem Relation Age of Onset   Pneumonia Mother 33       complications of diabetes   Diabetes Mother    Pneumonia Father 80       complication of pneumonia   Cancer Sister        breast   Cancer Sister        breast cancer   Colon cancer  Neg Hx      Social History Kyle Daniel reports that he has been smoking cigarettes. He started smoking about 61 years ago. He has a 75.00 pack-year smoking history. He has been exposed to tobacco smoke. He has never used smokeless tobacco. Kyle Daniel reports no history of alcohol use.   Review of Systems CONSTITUTIONAL: No weight loss, fever, chills,  weakness or fatigue.  HEENT: Eyes: No visual loss, blurred vision, double vision or yellow sclerae.No hearing loss, sneezing, congestion, runny nose or sore throat.  SKIN: No rash or itching.  CARDIOVASCULAR: per hpi RESPIRATORY: No shortness of breath, cough or sputum.  GASTROINTESTINAL: No anorexia, nausea, vomiting or diarrhea. No abdominal pain or blood.  GENITOURINARY: No burning on urination, no polyuria NEUROLOGICAL: No headache, dizziness, syncope, paralysis, ataxia, numbness or tingling in the extremities. No change in bowel or bladder control.  MUSCULOSKELETAL: No muscle, back pain, joint pain or stiffness.  LYMPHATICS: No enlarged nodes. No history of splenectomy.  PSYCHIATRIC: No history of depression or anxiety.  ENDOCRINOLOGIC: No reports of sweating, cold or heat intolerance. No polyuria or polydipsia.  Marland Kitchen   Physical Examination Today's Vitals   04/14/22 1131  BP: 130/64  Pulse: 75  SpO2: 96%  Weight: 257 lb 9.6 oz (116.8 kg)  Height: 6\' 1"  (1.854 m)   Body mass index is 33.99 kg/m.  Gen: resting comfortably, no acute distress HEENT: no scleral icterus, pupils equal round and reactive, no palptable cervical adenopathy,  CV: RRR, no m/rg , no jvd Resp: Clear to auscultation bilaterally GI: abdomen is soft, non-tender, non-distended, normal bowel sounds, no hepatosplenomegaly MSK: extremities are warm, no edema.  Skin: warm, no rash Neuro:  no focal deficits Psych: appropriate affect   Diagnostic Studies  Echocardiogram 05/31/2019:   1. Left ventricular ejection fraction, by visual estimation, is 60 to  65%. The  left ventricle has normal function. There is moderately increased  left ventricular hypertrophy.   2. Left ventricular diastolic parameters are consistent with Grade II  diastolic dysfunction (pseudonormalization).   3. Global right ventricle has normal systolic function.The right  ventricular size is normal. No increase in right ventricular wall  thickness.   4. Left atrial size was mildly dilated.   5. Right atrial size was normal.   6. Presence of pericardial fat pad.   7. Mild aortic valve annular calcification.   8. Mild to moderate mitral annular calcification.   9. The mitral valve is grossly normal. Trace mitral valve regurgitation.  10. The tricuspid valve is grossly normal. Tricuspid valve regurgitation  is trivial.  11. The aortic valve is tricuspid. Aortic valve regurgitation is not  visualized.  12. The pulmonic valve was grossly normal. Pulmonic valve regurgitation is  trivial.  13. TR signal is inadequate for assessing pulmonary artery systolic  pressure.  14. The inferior vena cava is normal in size with greater than 50%  respiratory variability, suggesting right atrial pressure of 3 mmHg.    09/2020 carotid 10/2020 IMPRESSION: At least 50-69% stenosis of the internal carotid arteries bilaterally. There is long segment shadowing plaque within the distal common and proximal internal carotid arteries bilaterally which may be obscuring regions of higher velocity/stenosis. Further evaluation with CT angiography of the neck should be considered to more definitively characterize degree of stenosis.   These results will be called to the ordering clinician or representative by the Radiologist Assistant, and communication documented in the PACS or [place].   Assessment and Plan  1. CAD - no symptoms - EKG shows SR, no ischemic changes - continue current meds     2. HTN - bp at goal, continue current meds     3. Hyperlipidemia -request pcp labs continue  statin   F/u 6 months      [employer], M.D.

## 2022-05-02 NOTE — Progress Notes (Unsigned)
Kyle Daniel, Kyle Daniel, Kyle Daniel, Kyle [phone]   REASON FOR VISIT:  Follow-up for iron deficiency anemia   PRIOR THERAPY: PRBC transfusions   CURRENT THERAPY: Intermittent IV iron (most recent Feraheme on 03/02/2022 and 03/09/2022)  INTERVAL HISTORY:  Kyle Daniel 76y.o. male returns for routine follow-up of his iron deficiency anemia.  He was last evaluated by Rojelio Brennerebekah Mar Walmer PA-C via telemedicine visit on 02/18/2022.   He did not notice any change in his symptoms or improvement in his energy after his IV iron in August 2023.  He continues to have intermittent black tarry bowel movements, most recently 2 to 3 weeks ago.  No gross hematochezia or hematemesis.  He continues to feel somewhat more weak and tired than usual. He has some palpitations and intermittent exertional chest pain and is followed by cardiology.  He has some mild intermittent dyspnea on exertion.  No pica, restless legs, or syncope.  He has 50% energy and 75% appetite. He endorses that he is maintaining a stable weight.   REVIEW OF SYSTEMS:  Review of Systems  Constitutional:  Positive for fatigue. Negative for appetite change, chills, diaphoresis, fever and unexpected weight change.  HENT:   Positive for trouble swallowing. Negative for lump/mass and nosebleeds.   Eyes:  Negative for eye problems.  Respiratory:  Positive for shortness of breath. Negative for cough and hemoptysis.   Cardiovascular:  Negative for chest pain, leg swelling and palpitations.  Gastrointestinal:  Negative for abdominal pain, blood in stool, constipation, diarrhea, nausea and vomiting.  Genitourinary:  Positive for frequency. Negative for hematuria.   Musculoskeletal:  Positive for arthralgias and back pain.  Skin: Negative.   Neurological:  Positive for dizziness, headaches and numbness.  Negative for light-headedness.  Hematological:  Does not bruise/bleed easily.  Psychiatric/Behavioral:  Positive for depression. The patient is nervous/anxious.       PAST MEDICAL/SURGICAL HISTORY:  Past Medical History:  Diagnosis Date   Anemia    ASCVD (arteriosclerotic cardiovascular disease)    S/P CABG surgery  in 2004; cardiac cath in 2005 revealed patent grafts.   BPH (benign prostatic hypertrophy)    Carotid artery occlusion    Cerebrovascular disease    bilateral bruits   Coronary artery disease    DM (diabetes mellitus) (HCC)    AODM-- insulin requiring for the past 1825yrs   Hx of adenomatous colonic polyps    Hyperlipidemia    Hypertension    IDA (iron deficiency anemia)    Pneumonia 2020   Tobacco abuse    50 pack years; current 1 pack per day   Past Surgical History:  Procedure Laterality Date   BIOPSY  01/28/2020   Procedure: BIOPSY;  Surgeon: Corbin Adeourk, Robert M, Kyle;  Location: [facility];  Service: Endoscopy;;   CERVICAL DISCECTOMY  1996   anterior cervical spine discectomy and fusion   CHOLECYSTECTOMY  06/17/2003   by Lebron ConnersWilliam Bowman Kyle   COLONOSCOPY W/ POLYPECTOMY     4 adenomatous polyps   COLONOSCOPY WITH PROPOFOL N/A 01/29/2020   normal colon, normal appearing TI.    CORONARY ARTERY BYPASS GRAFT     ENDARTERECTOMY Right 12/08/2020   Procedure: RIGHT CAROTID ENDARTERECTOMY;  Surgeon: Larina EarthlyEarly, Todd F, Kyle;  [facility];  Service: Vascular;  Laterality: Right;   ESOPHAGOGASTRODUODENOSCOPY  07/04/2007   Dr. Darrick PennaFields; normal esophagus, erythema/edema  in gastric antrum s/p biopsied, duodenitis.  Pathology positive for H. pylori.   ESOPHAGOGASTRODUODENOSCOPY (EGD) WITH PROPOFOL N/A 01/28/2020    mild erosive reflux esophagitis, s/p biopsy of gastric mucosa, normal duodenum overall except for minimally eroded second portion of mucosa s/p biopsy. Pathology: slight chronic gastritis, negative H.pylori, benign duodenal mucosa, no sprue.    GIVENS CAPSULE STUDY N/A  02/26/2020   Procedure: GIVENS CAPSULE STUDY;  Surgeon: Eloise Harman, DO;  Location: [facility];  Service: Endoscopy;  Laterality: N/A;  7:30am   GIVENS CAPSULE STUDY N/A 03/12/2020   Surgeon: Daneil Dolin, Kyle;  blood flecks/wisp of blood in the stomach without obvious source, possible erosions/ulcers in the small bowel, but no active bleeding.   PATCH ANGIOPLASTY Right 12/08/2020   Procedure: PATCH ANGIOPLASTY HEMASHIELD TO RIGHT INTERNAL CAROTID ARTERY;  Surgeon: Rosetta Posner, Kyle;  Location: [facility] OR;  Service: Vascular;  Laterality: Right;     SOCIAL HISTORY:  Social History   Socioeconomic History   Marital status: Married    Spouse name: valeria   Number of children: 3   Years of education: Not on file   Highest education level: Not on file  Occupational History   Occupation: disabled  Tobacco Use   Smoking status: Every Day    Packs/day: 1.50    Years: 50.00    Total pack years: 75.00    Types: Cigarettes    Start date: 09/05/1960    Passive exposure: Current   Smokeless tobacco: Never  Vaping Use   Vaping Use: Never used  Substance and Sexual Activity   Alcohol use: No    Comment: quit 1980's   Drug use: No   Sexual activity: Not on file  Other Topics Concern   Not on file  Social History Narrative   Not on file   Social Determinants of Health   Financial Resource Strain: Low Risk  (09/28/2020)   Overall Financial Resource Strain (CARDIA)    Difficulty of Paying Living Expenses: Not hard at all  Food Insecurity: No Food Insecurity (09/28/2020)   Hunger Vital Sign    Worried About Running Out of Food in the Last Year: Never true    [place] in the Last Year: Never true  Transportation Needs: No Transportation Needs (09/28/2020)   PRAPARE - Hydrologist (Medical): No    Lack of Transportation (Non-Medical): No  Physical Activity: Inactive (09/28/2020)   Exercise Vital Sign    Days of Exercise per Week: 0 days    Minutes  of Exercise per Session: 0 min  Stress: No Stress Concern Present (09/28/2020)   [place]    Feeling of Stress : Only a little  Social Connections: Socially Integrated (09/28/2020)   Social Connection and Isolation Panel [NHANES]    Frequency of Communication with Friends and Family: More than three times a week    Frequency of Social Gatherings with Friends and Family: More than three times a week    Attends Religious Services: 1 to 4 times per year    Active Member of [employer] or Organizations: Yes    Attends Music therapist: More than 4 times per year    Marital Status: Married  Human resources officer Violence: Not At Risk (09/28/2020)   Humiliation, Afraid, Rape, and Kick questionnaire    Fear of Current or Ex-Partner: No    Emotionally Abused: No    Physically Abused:  No    Sexually Abused: No    FAMILY HISTORY:  Family History  Problem Relation Age of Onset   Pneumonia Mother 60       complications of diabetes   Diabetes Mother    Pneumonia Father 10       complication of pneumonia   Cancer Sister        breast   Cancer Sister        breast cancer   Colon cancer Neg Hx     CURRENT MEDICATIONS:  Outpatient Encounter Medications as of 05/03/2022  Medication Sig Note   albuterol (VENTOLIN HFA) 108 (90 Base) MCG/ACT inhaler Inhale 1-2 puffs into the lungs every 6 (six) hours as needed for wheezing or shortness of breath.    Alcohol Swabs (ALCOHOL WIPES) 70 % PADS Apply topically.    amitriptyline (ELAVIL) 25 MG tablet Take 25 mg by mouth at bedtime.    amLODipine (NORVASC) 10 MG tablet Take 1 tablet (10 mg total) by mouth daily.    aspirin 81 MG tablet Take 1 tablet (81 mg total) by mouth daily with breakfast.    cholecalciferol (VITAMIN D3) 25 MCG (1000 UNIT) tablet Take 1,000 Units by mouth every morning.    dorzolamide-timolol (COSOPT) 22.3-6.8 MG/ML ophthalmic solution Place 1 drop into the right eye  in the morning.    ferrous sulfate 325 (65 FE) MG tablet Take 325 mg by mouth daily with breakfast.    finasteride (PROSCAR) 5 MG tablet Take 5 mg by mouth at bedtime.    gabapentin (NEURONTIN) 100 MG capsule Take 200 mg by mouth 2 (two) times daily.     insulin glargine (LANTUS) 100 UNIT/ML injection Inject 28 Units into the skin in the morning and at bedtime. 12/08/2021: 25 units bid   insulin glargine-yfgn (SEMGLEE) 100 UNIT/ML injection     losartan (COZAAR) 25 MG tablet Take 1 tablet (25 mg total) by mouth daily.    nitroGLYCERIN (NITROSTAT) 0.4 MG SL tablet Place 1 tablet (0.4 mg total) under the tongue every 5 (five) minutes as needed for chest pain.    Omega-3 Fatty Acids (FISH OIL) 1000 MG CAPS Take 3 capsules by mouth 2 (two) times daily before a meal.    omeprazole (PRILOSEC) 20 MG capsule Take 1 capsule (20 mg total) by mouth daily.    oxyCODONE-acetaminophen (PERCOCET) 5-325 MG tablet Take 1 tablet by mouth every 6 (six) hours as needed for severe pain.    pravastatin (PRAVACHOL) 80 MG tablet Take 1 tablet (80 mg total) by mouth daily.    prazosin (MINIPRESS) 2 MG capsule Take 2 mg by mouth at bedtime.    torsemide (DEMADEX) 20 MG tablet Take 1 tablet by mouth daily.    traZODone (DESYREL) 50 MG tablet Take 50 mg by mouth at bedtime.    No facility-administered encounter medications on file as of 05/03/2022.    ALLERGIES:  Allergies  Allergen Reactions   Lisinopril Cough     PHYSICAL EXAM:  ECOG PERFORMANCE STATUS: 1 - Symptomatic but completely ambulatory  There were no vitals filed for this visit. There were no vitals filed for this visit. Physical Exam Constitutional:      Appearance: Normal appearance. He is obese.  HENT:     Head: Normocephalic and atraumatic.     Mouth/Throat:     Mouth: Mucous membranes are moist.  Eyes:     Extraocular Movements: Extraocular movements intact.     Pupils: Pupils are equal, round, and reactive  to light.  Cardiovascular:      Rate and Rhythm: Normal rate and regular rhythm.     Pulses: Normal pulses.     Heart sounds: Normal heart sounds.  Pulmonary:     Effort: Pulmonary effort is normal.     Breath sounds: Normal breath sounds.  Abdominal:     General: Bowel sounds are normal.     Palpations: Abdomen is soft.     Tenderness: There is no abdominal tenderness.  Musculoskeletal:        General: No swelling.     Right lower leg: No edema.     Left lower leg: No edema.  Lymphadenopathy:     Cervical: No cervical adenopathy.  Skin:    General: Skin is warm and dry.  Neurological:     General: No focal deficit present.     Mental Status: He is alert and oriented to person, place, and time.  Psychiatric:        Mood and Affect: Mood normal.        Behavior: Behavior normal.      LABORATORY DATA:  I have reviewed the labs as listed.  CBC    Component Value Date/Time   WBC 3.4 (L) 02/15/2022 1001   RBC 3.81 (L) 02/15/2022 1001   HGB 11.1 (L) 02/15/2022 1001   HCT 33.8 (L) 02/15/2022 1001   PLT 201 02/15/2022 1001   MCV 88.7 02/15/2022 1001   MCH 29.1 02/15/2022 1001   MCHC 32.8 02/15/2022 1001   RDW 14.4 02/15/2022 1001   LYMPHSABS 1.1 02/15/2022 1001   MONOABS 0.2 02/15/2022 1001   EOSABS 0.1 02/15/2022 1001   BASOSABS 0.0 02/15/2022 1001      Latest Ref Rng & Units 12/09/2020    4:52 AM 12/02/2020   11:30 AM 10/28/2020    2:32 PM  CMP  Glucose 70 - 99 mg/dL 329  924    BUN 8 - 23 mg/dL 15  10    Creatinine 2.68 - 1.24 mg/dL 3.41  9.62  2.29   Sodium 135 - 145 mmol/L 136  139    Potassium 3.5 - 5.1 mmol/L 3.8  3.5    Chloride 98 - 111 mmol/L 107  107    CO2 22 - 32 mmol/L 23  26    Calcium 8.9 - 10.3 mg/dL 8.0  8.7    Total Protein 6.5 - 8.1 g/dL  6.8    Total Bilirubin 0.3 - 1.2 mg/dL  0.1    Alkaline Phos 38 - 126 U/L  41    AST 15 - 41 U/L  16    ALT 0 - 44 U/L  19      DIAGNOSTIC IMAGING:  I have independently reviewed the relevant imaging and discussed with the  patient.  ASSESSMENT & PLAN: 1.  Chronic iron deficiency anemia:  - History of GI bleed in October 2020 at [facility] in [place], required 5 units of PRBC. - Admitted to the hospital from 01/27/2020 through 01/29/2020 with dizziness and dyspnea on exertion. Found to have hemoglobin 5.6, received blood transfusions. - EGD on 01/28/2020 and colonoscopy on 01/29/2019 were nonrevealing. - Capsule study on 03/12/2020, stomach with several images of blood flecks or wisps of blood without obvious source.  Multiple images of duodenal, proximal jejunal areas with what appears lymphangiectasia's without active bleeding.  Further along in the study, images with possible erosions/ulcers. - Other nutritional deficiency work-up was negative.  SPEP was negative. - Most recent Feraheme  on 03/02/2022 and 03/09/2022 - Most recent labs (05/03/2022): Hgb 10.8/MCV 90.2, ferritin 28, iron saturation 15% - Dark bowel movements from oral iron with intermittent melena, last "black and sticky" bowel movement about 2 to 3 weeks ago   - Symptomatic with moderate fatigue - He is taking B12 and iron supplements at home (stools were dark even before starting iron)  - Labs from last year show evidence of mild CKD stage IIIa, which may also be contributing to his anemia. - PLAN: Recommend IV Feraheme x2.     - We will repeat CBC/CMP/iron panel in 3 months followed by phone visit  - Continue to follow with gastroenterologists.  - Patient aware of alarm symptoms that would prompt immediate medical attention at the ED.   2.  Leukopenia, mild - Mild leukopenia since June 2022.  Most recent CBC (05/03/2022) shows WBC 3.1 with ANC 1.6 - No frequent infections or B symptoms. - PLAN: We will check vitamin B12, MMA, folate, copper at follow-up visit.    3.  Smoking history: - Reviewed results of CT chest from 05/26/2020 which was lung RADS 1. - CT chest/low-dose LCS (09/21/2021): Lung-RADS 1, negative - PLAN: He will be due for next annual  LDCT in February 2024     4.  Social/family history: - Active smoker for 56 years, 1.5 PPD current smoker - Sisters x 2 with breast cancer    PLAN SUMMARY & DISPOSITION: IV Feraheme x2 Labs in 3 months Phone visit after labs  All questions were answered. The patient knows to call the clinic with any problems, questions or concerns.  Medical decision making: Low  Time spent on visit: I spent 15 minutes counseling the patient face to face. The total time spent in the appointment was 22 minutes and more than 50% was on counseling.   Carnella Guadalajara, PA-C  05/03/2022 4:36 PM

## 2022-05-03 ENCOUNTER — Inpatient Hospital Stay: Payer: Medicare Other | Attending: Hematology

## 2022-05-03 ENCOUNTER — Inpatient Hospital Stay (HOSPITAL_BASED_OUTPATIENT_CLINIC_OR_DEPARTMENT_OTHER): Payer: Medicare Other | Admitting: Physician Assistant

## 2022-05-03 VITALS — BP 142/74 | HR 81 | Temp 97.5°F | Resp 20 | Wt 254.1 lb

## 2022-05-03 DIAGNOSIS — D72819 Decreased white blood cell count, unspecified: Secondary | ICD-10-CM | POA: Insufficient documentation

## 2022-05-03 DIAGNOSIS — E1169 Type 2 diabetes mellitus with other specified complication: Secondary | ICD-10-CM

## 2022-05-03 DIAGNOSIS — D500 Iron deficiency anemia secondary to blood loss (chronic): Secondary | ICD-10-CM | POA: Diagnosis not present

## 2022-05-03 DIAGNOSIS — D509 Iron deficiency anemia, unspecified: Secondary | ICD-10-CM | POA: Diagnosis present

## 2022-05-03 DIAGNOSIS — Z794 Long term (current) use of insulin: Secondary | ICD-10-CM | POA: Diagnosis not present

## 2022-05-03 DIAGNOSIS — D708 Other neutropenia: Secondary | ICD-10-CM

## 2022-05-03 LAB — CBC WITH DIFFERENTIAL/PLATELET
Abs Immature Granulocytes: 0.02 10*3/uL (ref 0.00–0.07)
Basophils Absolute: 0 10*3/uL (ref 0.0–0.1)
Basophils Relative: 1 %
Eosinophils Absolute: 0.1 10*3/uL (ref 0.0–0.5)
Eosinophils Relative: 2 %
HCT: 33.1 % — ABNORMAL LOW (ref 39.0–52.0)
Hemoglobin: 10.8 g/dL — ABNORMAL LOW (ref 13.0–17.0)
Immature Granulocytes: 1 %
Lymphocytes Relative: 37 %
Lymphs Abs: 1.1 10*3/uL (ref 0.7–4.0)
MCH: 29.4 pg (ref 26.0–34.0)
MCHC: 32.6 g/dL (ref 30.0–36.0)
MCV: 90.2 fL (ref 80.0–100.0)
Monocytes Absolute: 0.2 10*3/uL (ref 0.1–1.0)
Monocytes Relative: 6 %
Neutro Abs: 1.6 10*3/uL — ABNORMAL LOW (ref 1.7–7.7)
Neutrophils Relative %: 53 %
Platelets: 222 10*3/uL (ref 150–400)
RBC: 3.67 MIL/uL — ABNORMAL LOW (ref 4.22–5.81)
RDW: 14.1 % (ref 11.5–15.5)
WBC: 3.1 10*3/uL — ABNORMAL LOW (ref 4.0–10.5)
nRBC: 0 % (ref 0.0–0.2)

## 2022-05-03 LAB — IRON AND TIBC
Iron: 50 ug/dL (ref 45–182)
Saturation Ratios: 15 % — ABNORMAL LOW (ref 17.9–39.5)
TIBC: 334 ug/dL (ref 250–450)
UIBC: 284 ug/dL

## 2022-05-03 LAB — FERRITIN: Ferritin: 28 ng/mL (ref 24–336)

## 2022-05-03 NOTE — Patient Instructions (Signed)
[facility] **   You were seen today by Tarri Abernethy PA-C for your iron deficiency anemia.   Your blood and iron levels are low again today.  This is from your chronic GI bleeding. We will schedule you for IV iron x3 doses. We will recheck labs and see you for follow-up visit in 3 months.  FOLLOW-UP APPOINTMENT: Labs and phone visit in 3 months  ** Thank you for trusting me with your healthcare!  I strive to provide all of my patients with quality care at each visit.  If you receive a survey for this visit, I would be so grateful to you for taking the time to provide feedback.  Thank you in advance!  ~ Keslee Harrington                   Dr. Derek Jack   &   Tarri Abernethy, PA-C   - - - - - - - - - - - - - - - - - -    Thank you for choosing [facility] to provide your oncology and hematology care.  To afford each patient quality time with our provider, please arrive at least 15 minutes before your scheduled appointment time.   If you have a lab appointment with the [facility] please come in thru the Main Entrance and check in at the main information desk.  You need to re-schedule your appointment should you arrive 10 or more minutes late.  We strive to give you quality time with our providers, and arriving late affects you and other patients whose appointments are after yours.  Also, if you no show three or more times for appointments you may be dismissed from the clinic at the providers discretion.     Again, thank you for choosing [facility].  Our hope is that these requests will decrease the amount of time that you wait before being seen by our physicians.       _____________________________________________________________  Should you have questions after your visit to [facility], please contact our office at [phone] and follow  the prompts.  Our office hours are 8:00 a.m. and 4:30 p.m. Monday - Friday.  Please note that voicemails left after 4:00 p.m. may not be returned until the following business day.  We are closed weekends and major holidays.  You do have access to a nurse 24-7, just call the main number to the clinic [phone] and do not press any options, hold on the line and a nurse will answer the phone.    For prescription refill requests, have your pharmacy contact our office and allow 72 hours.

## 2022-05-11 ENCOUNTER — Inpatient Hospital Stay: Payer: Medicare Other

## 2022-05-11 VITALS — BP 153/73 | HR 70 | Temp 98.1°F | Resp 18

## 2022-05-11 DIAGNOSIS — D509 Iron deficiency anemia, unspecified: Secondary | ICD-10-CM | POA: Diagnosis not present

## 2022-05-11 DIAGNOSIS — D500 Iron deficiency anemia secondary to blood loss (chronic): Secondary | ICD-10-CM

## 2022-05-11 DIAGNOSIS — K922 Gastrointestinal hemorrhage, unspecified: Secondary | ICD-10-CM

## 2022-05-11 MED ORDER — ACETAMINOPHEN 325 MG PO TABS
650.0000 mg | ORAL_TABLET | Freq: Once | ORAL | Status: AC
  Administered 2022-05-11: 650 mg via ORAL
  Filled 2022-05-11: qty 2

## 2022-05-11 MED ORDER — SODIUM CHLORIDE 0.9 % IV SOLN
510.0000 mg | Freq: Once | INTRAVENOUS | Status: AC
  Administered 2022-05-11: 510 mg via INTRAVENOUS
  Filled 2022-05-11: qty 17

## 2022-05-11 MED ORDER — SODIUM CHLORIDE 0.9 % IV SOLN: Freq: Once | INTRAVENOUS | Status: AC

## 2022-05-11 MED ORDER — LORATADINE 10 MG PO TABS
10.0000 mg | ORAL_TABLET | Freq: Once | ORAL | Status: AC
  Administered 2022-05-11: 10 mg via ORAL
  Filled 2022-05-11: qty 1

## 2022-05-11 NOTE — Patient Instructions (Signed)
[facility]  Discharge Instructions: Thank you for choosing [facility] to provide your oncology and hematology care.  If you have a lab appointment with the Cancer Center, please come in thru the Main Entrance and check in at the main information desk.  Wear comfortable clothing and clothing appropriate for easy access to any Portacath or PICC line.   We strive to give you quality time with your provider. You may need to reschedule your appointment if you arrive late (15 or more minutes).  Arriving late affects you and other patients whose appointments are after yours.  Also, if you miss three or more appointments without notifying the office, you may be dismissed from the clinic at the provider's discretion.      For prescription refill requests, have your pharmacy contact our office and allow 72 hours for refills to be completed.     To help prevent nausea and vomiting after your treatment, we encourage you to take your nausea medication as directed.  BELOW ARE SYMPTOMS THAT SHOULD BE REPORTED IMMEDIATELY: *FEVER GREATER THAN 100.4 F (38 C) OR HIGHER *CHILLS OR SWEATING *NAUSEA AND VOMITING THAT IS NOT CONTROLLED WITH YOUR NAUSEA MEDICATION *UNUSUAL SHORTNESS OF BREATH *UNUSUAL BRUISING OR BLEEDING *URINARY PROBLEMS (pain or burning when urinating, or frequent urination) *BOWEL PROBLEMS (unusual diarrhea, constipation, pain near the anus) TENDERNESS IN MOUTH AND THROAT WITH OR WITHOUT PRESENCE OF ULCERS (sore throat, sores in mouth, or a toothache) UNUSUAL RASH, SWELLING OR PAIN  UNUSUAL VAGINAL DISCHARGE OR ITCHING   Items with * indicate a potential emergency and should be followed up as soon as possible or go to the Emergency Department if any problems should occur.  Please show the CHEMOTHERAPY ALERT CARD or IMMUNOTHERAPY ALERT CARD at check-in to the Emergency Department and triage nurse.  Should you have questions after your visit or need to  cancel or reschedule your appointment, please contact [facility] [phone]  and follow the prompts.  Office hours are 8:00 a.m. to 4:30 p.m. Monday - Friday. Please note that voicemails left after 4:00 p.m. may not be returned until the following business day.  We are closed weekends and major holidays. You have access to a nurse at all times for urgent questions. Please call the main number to the clinic [phone] and follow the prompts.  For any non-urgent questions, you may also contact your provider using MyChart. We now offer e-Visits for anyone 18 and older to request care online for non-urgent symptoms. For details visit [email or web address].   Also download the MyChart app! Go to the app store, search "MyChart", open the app, select [facility], and log in with your MyChart username and password.  Masks are optional in the cancer centers. If you would like for your care team to wear a mask while they are taking care of you, please let them know. You may have one support person who is at least 74 years old accompany you for your appointments.  

## 2022-05-11 NOTE — Progress Notes (Signed)
Patient presents today for iron infusion.  Patient is in satisfactory condition with no complaints voiced.  Vital signs are stable.  We will proceed with infusion per provider orders.   Patient tolerated treatment well with no complaints voiced.  Patient left ambulatory in stable condition.  Vital signs stable at discharge.  Follow up as scheduled.    

## 2022-05-18 ENCOUNTER — Inpatient Hospital Stay: Payer: Medicare Other

## 2022-05-18 VITALS — BP 126/68 | HR 65 | Temp 97.5°F | Resp 18

## 2022-05-18 DIAGNOSIS — D500 Iron deficiency anemia secondary to blood loss (chronic): Secondary | ICD-10-CM

## 2022-05-18 DIAGNOSIS — D509 Iron deficiency anemia, unspecified: Secondary | ICD-10-CM | POA: Diagnosis not present

## 2022-05-18 DIAGNOSIS — K922 Gastrointestinal hemorrhage, unspecified: Secondary | ICD-10-CM

## 2022-05-18 MED ORDER — SODIUM CHLORIDE 0.9 % IV SOLN
510.0000 mg | Freq: Once | INTRAVENOUS | Status: AC
  Administered 2022-05-18: 510 mg via INTRAVENOUS
  Filled 2022-05-18: qty 510

## 2022-05-18 MED ORDER — LORATADINE 10 MG PO TABS
10.0000 mg | ORAL_TABLET | Freq: Once | ORAL | Status: AC
  Administered 2022-05-18: 10 mg via ORAL
  Filled 2022-05-18: qty 1

## 2022-05-18 MED ORDER — SODIUM CHLORIDE 0.9 % IV SOLN: Freq: Once | INTRAVENOUS | Status: AC

## 2022-05-18 MED ORDER — ACETAMINOPHEN 325 MG PO TABS
650.0000 mg | ORAL_TABLET | Freq: Once | ORAL | Status: AC
  Administered 2022-05-18: 650 mg via ORAL
  Filled 2022-05-18: qty 2

## 2022-05-18 NOTE — Patient Instructions (Signed)
[facility] [place] [facility]  Discharge Instructions: Thank you for choosing [facility] to provide your oncology and hematology care.  If you have a lab appointment with the Cancer Center, please come in thru the Main Entrance and check in at the main information desk.  Wear comfortable clothing and clothing appropriate for easy access to any Portacath or PICC line.   We strive to give you quality time with your provider. You may need to reschedule your appointment if you arrive late (15 or more minutes).  Arriving late affects you and other patients whose appointments are after yours.  Also, if you miss three or more appointments without notifying the office, you may be dismissed from the clinic at the provider's discretion.      For prescription refill requests, have your pharmacy contact our office and allow 72 hours for refills to be completed.    Today you received the following Feraheme, return as scheduled.   To help prevent nausea and vomiting after your treatment, we encourage you to take your nausea medication as directed.  BELOW ARE SYMPTOMS THAT SHOULD BE REPORTED IMMEDIATELY: *FEVER GREATER THAN 100.4 F (38 C) OR HIGHER *CHILLS OR SWEATING *NAUSEA AND VOMITING THAT IS NOT CONTROLLED WITH YOUR NAUSEA MEDICATION *UNUSUAL SHORTNESS OF BREATH *UNUSUAL BRUISING OR BLEEDING *URINARY PROBLEMS (pain or burning when urinating, or frequent urination) *BOWEL PROBLEMS (unusual diarrhea, constipation, pain near the anus) TENDERNESS IN MOUTH AND THROAT WITH OR WITHOUT PRESENCE OF ULCERS (sore throat, sores in mouth, or a toothache) UNUSUAL RASH, SWELLING OR PAIN  UNUSUAL VAGINAL DISCHARGE OR ITCHING   Items with * indicate a potential emergency and should be followed up as soon as possible or go to the Emergency Department if any problems should occur.  Please show the CHEMOTHERAPY ALERT CARD or IMMUNOTHERAPY ALERT CARD at check-in to the Emergency Department and  triage nurse.  Should you have questions after your visit or need to cancel or reschedule your appointment, please contact [facility] AT  [phone]  and follow the prompts.  Office hours are 8:00 a.m. to 4:30 p.m. Monday - Friday. Please note that voicemails left after 4:00 p.m. may not be returned until the following business day.  We are closed weekends and major holidays. You have access to a nurse at all times for urgent questions. Please call the main number to the clinic [phone] and follow the prompts.  For any non-urgent questions, you may also contact your provider using MyChart. We now offer e-Visits for anyone 18 and older to request care online for non-urgent symptoms. For details visit [email or web address].   Also download the MyChart app! Go to the app store, search "MyChart", open the app, select [facility], and log in with your MyChart username and password.  Masks are optional in the cancer centers. If you would like for your care team to wear a mask while they are taking care of you, please let them know. You may have one support person who is at least 74 years old accompany you for your appointments.  

## 2022-05-18 NOTE — Progress Notes (Signed)
Patient tolerated iron infusion with no complaints voiced. Peripheral IV site clean and dry with good blood return noted before and after infusion. Patient refused recommend 30 minute wait time. Band aid applied. VSS with discharge and left in satisfactory condition with no s/s of distress noted.

## 2022-05-25 ENCOUNTER — Encounter: Payer: Self-pay | Admitting: *Deleted

## 2022-08-01 ENCOUNTER — Other Ambulatory Visit: Payer: Self-pay

## 2022-08-01 DIAGNOSIS — Z122 Encounter for screening for malignant neoplasm of respiratory organs: Secondary | ICD-10-CM

## 2022-08-01 DIAGNOSIS — Z87891 Personal history of nicotine dependence: Secondary | ICD-10-CM

## 2022-08-01 NOTE — Progress Notes (Signed)
Order placed for LDCT per protocol. Scan scheduled for 02/28 at 1245

## 2022-08-04 ENCOUNTER — Inpatient Hospital Stay: Payer: Medicare Other | Attending: Hematology

## 2022-08-04 DIAGNOSIS — D509 Iron deficiency anemia, unspecified: Secondary | ICD-10-CM | POA: Diagnosis present

## 2022-08-04 DIAGNOSIS — F17210 Nicotine dependence, cigarettes, uncomplicated: Secondary | ICD-10-CM | POA: Diagnosis not present

## 2022-08-04 DIAGNOSIS — N1831 Chronic kidney disease, stage 3a: Secondary | ICD-10-CM | POA: Diagnosis not present

## 2022-08-04 DIAGNOSIS — D708 Other neutropenia: Secondary | ICD-10-CM

## 2022-08-04 DIAGNOSIS — D500 Iron deficiency anemia secondary to blood loss (chronic): Secondary | ICD-10-CM

## 2022-08-04 LAB — COMPREHENSIVE METABOLIC PANEL
ALT: 17 U/L (ref 0–44)
AST: 14 U/L — ABNORMAL LOW (ref 15–41)
Albumin: 3.5 g/dL (ref 3.5–5.0)
Alkaline Phosphatase: 46 U/L (ref 38–126)
Anion gap: 9 (ref 5–15)
BUN: 11 mg/dL (ref 8–23)
CO2: 25 mmol/L (ref 22–32)
Calcium: 8 mg/dL — ABNORMAL LOW (ref 8.9–10.3)
Chloride: 101 mmol/L (ref 98–111)
Creatinine, Ser: 1.23 mg/dL (ref 0.61–1.24)
GFR, Estimated: >60 mL/min (ref >60)
Glucose, Bld: 224 mg/dL — ABNORMAL HIGH (ref 70–99)
Potassium: 3.5 mmol/L (ref 3.5–5.1)
Sodium: 135 mmol/L (ref 135–145)
Total Bilirubin: 0.4 mg/dL (ref 0.3–1.2)
Total Protein: 6.6 g/dL (ref 6.5–8.1)

## 2022-08-04 LAB — FOLATE: Folate: 10.4 ng/mL (ref >5.9)

## 2022-08-04 LAB — CBC WITH DIFFERENTIAL/PLATELET
Abs Immature Granulocytes: 0.01 10*3/uL (ref 0.00–0.07)
Basophils Absolute: 0 10*3/uL (ref 0.0–0.1)
Basophils Relative: 1 %
Eosinophils Absolute: 0.1 10*3/uL (ref 0.0–0.5)
Eosinophils Relative: 2 %
HCT: 38 % — ABNORMAL LOW (ref 39.0–52.0)
Hemoglobin: 12.6 g/dL — ABNORMAL LOW (ref 13.0–17.0)
Immature Granulocytes: 0 %
Lymphocytes Relative: 44 %
Lymphs Abs: 1.4 10*3/uL (ref 0.7–4.0)
MCH: 28.4 pg (ref 26.0–34.0)
MCHC: 33.2 g/dL (ref 30.0–36.0)
MCV: 85.6 fL (ref 80.0–100.0)
Monocytes Absolute: 0.1 10*3/uL (ref 0.1–1.0)
Monocytes Relative: 4 %
Neutro Abs: 1.6 10*3/uL — ABNORMAL LOW (ref 1.7–7.7)
Neutrophils Relative %: 49 %
Platelets: 193 10*3/uL (ref 150–400)
RBC: 4.44 MIL/uL (ref 4.22–5.81)
RDW: 13.2 % (ref 11.5–15.5)
WBC: 3.2 10*3/uL — ABNORMAL LOW (ref 4.0–10.5)
nRBC: 0 % (ref 0.0–0.2)

## 2022-08-04 LAB — IRON AND TIBC
Iron: 53 ug/dL (ref 45–182)
Saturation Ratios: 17 % — ABNORMAL LOW (ref 17.9–39.5)
TIBC: 316 ug/dL (ref 250–450)
UIBC: 263 ug/dL

## 2022-08-04 LAB — VITAMIN B12: Vitamin B-12: 680 pg/mL (ref 180–914)

## 2022-08-04 LAB — FERRITIN: Ferritin: 38 ng/mL (ref 24–336)

## 2022-08-04 LAB — LACTATE DEHYDROGENASE: LDH: 167 U/L (ref 98–192)

## 2022-08-08 LAB — METHYLMALONIC ACID, SERUM: Methylmalonic Acid, Quantitative: 138 nmol/L (ref 0–378)

## 2022-08-08 LAB — COPPER, SERUM: Copper: 96 ug/dL (ref 69–132)

## 2022-08-11 ENCOUNTER — Other Ambulatory Visit: Payer: Self-pay

## 2022-08-11 ENCOUNTER — Encounter: Payer: Self-pay | Admitting: Physician Assistant

## 2022-08-11 ENCOUNTER — Inpatient Hospital Stay (HOSPITAL_BASED_OUTPATIENT_CLINIC_OR_DEPARTMENT_OTHER): Payer: Medicare Other | Admitting: Physician Assistant

## 2022-08-11 DIAGNOSIS — D500 Iron deficiency anemia secondary to blood loss (chronic): Secondary | ICD-10-CM

## 2022-08-11 NOTE — Progress Notes (Addendum)
VIRTUAL VISIT via [place]   I connected with Kyle Daniel  on 08/11/22 at  11:28 AM by telephone and verified that I am speaking with the correct person using two identifiers.  Location: Patient: Home Provider: [facility]   I discussed the limitations, risks, security and privacy concerns of performing an evaluation and management service by telephone and the availability of in person appointments. I also discussed with the patient that there may be a patient responsible charge related to this service. The patient expressed understanding and agreed to proceed.  REASON FOR VISIT:  Follow-up for iron deficiency anemia   PRIOR THERAPY: PRBC transfusions   CURRENT THERAPY: Intermittent IV iron  INTERVAL HISTORY:  [name] is contacted today for follow-up of  his iron deficiency anemia.  He was last seen in clinic by Tarri Abernethy PA-C on 05/03/2022 and received Feraheme on 05/11/2022 and 05/18/2022.   He did not notice any change in his symptoms or improvement in his energy after his IV iron in October 2023.  He continues to have intermittent black tarry bowel movements, Daniel recently about 1 month ago.  No gross hematochezia or hematemesis. He continues to feel somewhat chronically weak and fatigued.  He has some palpitations and intermittent exertional chest pain and is followed by cardiology.  He has some mild intermittent dyspnea on exertion.  No pica, restless legs, or syncope.   He has 75% energy and 75% appetite. He endorses that he is maintaining a stable weight.   REVIEW OF SYSTEMS:   Review of Systems  Constitutional:  Positive for malaise/fatigue. Negative for chills, diaphoresis, fever and weight loss.  Respiratory:  Positive for cough. Negative for shortness of breath.   Cardiovascular:  Negative for chest pain and palpitations.  Gastrointestinal:  Negative for abdominal pain, blood in stool, melena, nausea and vomiting.   Neurological:  Positive for dizziness and tingling. Negative for headaches.  Psychiatric/Behavioral:  Positive for depression. The patient is nervous/anxious.      PHYSICAL EXAM: (per limitations of virtual telephone visit)  The patient is alert and oriented x 3, exhibiting adequate mentation, good mood, and ability to speak in full sentences and execute sound judgement.  ASSESSMENT & PLAN:  1.  Chronic iron deficiency anemia:  - History of GI bleed in October 2020 at [facility] in [place], required 5 units of PRBC. - Admitted to the hospital from 01/27/2020 through 01/29/2020 with dizziness and dyspnea on exertion. Found to have hemoglobin 5.6, received blood transfusions. - EGD on 01/28/2020 and colonoscopy on 01/29/2019 were nonrevealing. - Capsule study on 03/12/2020, stomach with several images of blood flecks or wisps of blood without obvious source.  Multiple images of duodenal, proximal jejunal areas with what appears lymphangiectasia's without active bleeding.  Further along in the study, images with possible erosions/ulcers. - Other nutritional deficiency work-up was negative.  SPEP was negative. - Daniel recent Feraheme x 2 in October 2023 - Daniel recent labs (08/04/2022): Hgb 12.6/MCV 85.6, ferritin 38, iron saturation 17% - Dark bowel movements from oral iron with intermittent melena, last "black and sticky" bowel movement about 1 month ago - Symptomatic with moderate fatigue - He is taking B12 and iron supplements at home (stools were dark even before starting iron)  - Labs from last year show evidence of mild CKD stage IIIa, which may also be contributing to his anemia. - PLAN: Recommend IV Feraheme x2.     - We will  repeat CBC/CMP/iron panel in 3 months followed by phone visit  - Continue to follow with gastroenterologists.  - Patient aware of alarm symptoms that would prompt immediate medical attention at the ED.   2.  Leukopenia, mild - Mild leukopenia since June 2022.  Daniel recent  CBC (08/04/2022) shows WBC 3.2 with ANC 1.6 - Nutritional panel (08/04/2022) normal folate, copper, B12, MMA.  CMP at baseline.  Normal LDH. - No frequent infections or B symptoms. - PLAN: We will continue surveillance with repeat CBC in 3 months   3.  Smoking history: - Reviewed results of CT chest from 05/26/2020 which was lung RADS 1. - CT chest/low-dose LCS (09/21/2021): Lung-RADS 1, negative - PLAN: He will be due for next annual LDCT in February 2024     4.  Social/family history: - Active smoker for 56 years, 1.5 PPD current smoker - Sisters x 2 with breast cancer -- Reports [employer] exposure  PLAN SUMMARY: >> Feraheme x 2 >> Labs in 3 months (CBC/D, CMP, ferritin, iron/TIBC) >> PHONE visit 1 week after labs     I discussed the assessment and treatment plan with the patient. The patient was provided an opportunity to ask questions and all were answered. The patient agreed with the plan and demonstrated an understanding of the instructions.   The patient was advised to call back or seek an in-person evaluation if the symptoms worsen or if the condition fails to improve as anticipated.  I provided 22 minutes of non-face-to-face time during this encounter.   Harriett Rush, PA-C 08/11/22 11:40 AM

## 2022-08-16 ENCOUNTER — Inpatient Hospital Stay: Payer: Medicare Other

## 2022-08-16 VITALS — BP 130/71 | HR 74 | Temp 96.9°F | Resp 18

## 2022-08-16 DIAGNOSIS — D500 Iron deficiency anemia secondary to blood loss (chronic): Secondary | ICD-10-CM

## 2022-08-16 DIAGNOSIS — D509 Iron deficiency anemia, unspecified: Secondary | ICD-10-CM | POA: Diagnosis not present

## 2022-08-16 DIAGNOSIS — K922 Gastrointestinal hemorrhage, unspecified: Secondary | ICD-10-CM

## 2022-08-16 MED ORDER — LORATADINE 10 MG PO TABS
10.0000 mg | ORAL_TABLET | Freq: Once | ORAL | Status: AC
  Administered 2022-08-16: 10 mg via ORAL
  Filled 2022-08-16: qty 1

## 2022-08-16 MED ORDER — SODIUM CHLORIDE 0.9 % IV SOLN: Freq: Once | INTRAVENOUS | Status: AC

## 2022-08-16 MED ORDER — ACETAMINOPHEN 325 MG PO TABS
650.0000 mg | ORAL_TABLET | Freq: Once | ORAL | Status: AC
  Administered 2022-08-16: 650 mg via ORAL
  Filled 2022-08-16: qty 2

## 2022-08-16 MED ORDER — SODIUM CHLORIDE 0.9 % IV SOLN
510.0000 mg | Freq: Once | INTRAVENOUS | Status: AC
  Administered 2022-08-16: 510 mg via INTRAVENOUS
  Filled 2022-08-16: qty 510

## 2022-08-16 NOTE — Patient Instructions (Signed)
[facility]  Discharge Instructions: Thank you for choosing [facility] to provide your oncology and hematology care.  If you have a lab appointment with the Cancer Center, please come in thru the Main Entrance and check in at the main information desk.  Wear comfortable clothing and clothing appropriate for easy access to any Portacath or PICC line.   We strive to give you quality time with your provider. You may need to reschedule your appointment if you arrive late (15 or more minutes).  Arriving late affects you and other patients whose appointments are after yours.  Also, if you miss three or more appointments without notifying the office, you may be dismissed from the clinic at the provider's discretion.      For prescription refill requests, have your pharmacy contact our office and allow 72 hours for refills to be completed.    Today you received the following Feraheme IV iron infusion.   BELOW ARE SYMPTOMS THAT SHOULD BE REPORTED IMMEDIATELY: *FEVER GREATER THAN 100.4 F (38 C) OR HIGHER *CHILLS OR SWEATING *NAUSEA AND VOMITING THAT IS NOT CONTROLLED WITH YOUR NAUSEA MEDICATION *UNUSUAL SHORTNESS OF BREATH *UNUSUAL BRUISING OR BLEEDING *URINARY PROBLEMS (pain or burning when urinating, or frequent urination) *BOWEL PROBLEMS (unusual diarrhea, constipation, pain near the anus) TENDERNESS IN MOUTH AND THROAT WITH OR WITHOUT PRESENCE OF ULCERS (sore throat, sores in mouth, or a toothache) UNUSUAL RASH, SWELLING OR PAIN  UNUSUAL VAGINAL DISCHARGE OR ITCHING   Items with * indicate a potential emergency and should be followed up as soon as possible or go to the Emergency Department if any problems should occur.  Please show the CHEMOTHERAPY ALERT CARD or IMMUNOTHERAPY ALERT CARD at check-in to the Emergency Department and triage nurse.  Should you have questions after your visit or need to cancel or reschedule your appointment, please contact  [facility] [phone]  and follow the prompts.  Office hours are 8:00 a.m. to 4:30 p.m. Monday - Friday. Please note that voicemails left after 4:00 p.m. may not be returned until the following business day.  We are closed weekends and major holidays. You have access to a nurse at all times for urgent questions. Please call the main number to the clinic [phone] and follow the prompts.  For any non-urgent questions, you may also contact your provider using MyChart. We now offer e-Visits for anyone 18 and older to request care online for non-urgent symptoms. For details visit [email or web address].   Also download the MyChart app! Go to the app store, search "MyChart", open the app, select [facility], and log in with your MyChart username and password.   

## 2022-08-16 NOTE — Progress Notes (Signed)
Pt presents today for Feraheme IV iron infusion per provider's order. Vital signs stable and pt voiced no new complaints at this time.  Peripheral IV started with good blood return pre and post infusion.  Feraheme given today per MD orders. Tolerated infusion without adverse affects. Vital signs stable. No complaints at this time. Discharged from clinic ambulatory in stable condition. Alert and oriented x 3. F/U with [facility] as scheduled.    

## 2022-08-23 ENCOUNTER — Inpatient Hospital Stay: Payer: Medicare Other

## 2022-08-29 NOTE — Progress Notes (Unsigned)
GI Office Note    Referring Provider: Jani Gravel, MD Primary Care Physician:  Jani Gravel, MD Primary Gastroenterologist: Elon Alas. Abbey Chatters, DO   Date:  08/30/2022  ID:  Kyle Daniel, DOB 1946/05/16, MRN [id number]   Chief Complaint   Chief Complaint  Patient presents with   Follow-up    Patient here today for a follow up visit on his IDA. Last lab work on 08/04/2022 Hgb 12.6, Hct 38.0, Fe Sat % was 17%. Patient is taking Ferrous sulfate 325 mg once per day. Patient states he see dark stools at times, denies any bright red blood seen. He does have occasional shortness of breath,dizziness and fatigue.    History of Present Illness  Kyle Daniel is a 77 y.o. male with a history of IDA - transfusion dependent, CAD s/p CABG in 2004, BPH, diabetes, HLD HTN, and adenomatous colon polyps presenting today for follow up.   History of profound anemia secondary to GI bleeding.  Prior hospitalization hospital in October 2020 and had hemoglobin of 4.2.  He received 5 units of PRBCs.  Also hospitalized again in July 2021 with profound anemia, heme positive stool.  He underwent colonoscopy and EGD during admission as outlined below.  Pathology negative for H. pylori.  During this admission his hemoglobin was 5.6 and he received 3 units of blood.  EGD 01/28/20: -Non erosive reflux esophagitis -Subtly abnormal gastric mucosa s/p biopsy -Minimally eroded second portion of duodenum s/p biopsy -No obvious blood on exam  Colonoscopy 01/29/20: -Completely normal colon -Normal TI and rectum. -Advised outpatient video capsule. -Close follow-up of H&H.  Givens capsule 02/26/20: Completed but Did Not Leave the Stomach, Recommended Repeating Capsule with 10 Mg IV Reglan 30 Minutes Prior.  Givens capsule 03/12/20: Several images of blood Fixapost blood without obvious source in the stomach.  Multiple images of duodenal/proximal jejunal areas with what appeared to be lymphangiectasia's without active bleeding.  Images  with possible erosions/ulcers further in the study.  Recommended strict NSAID avoidance.  Note to be sent to Dr. Harl Bowie to evaluate risk versus benefits of daily aspirin use.  Also given referral to hematology.  Patient has been following with hematology and receiving iron infusions.  Last visit 12/08/21. No rectal bleeding.  No change in bowel habits.  Denies any constipation, diarrhea, abdominal pain, nausea/vomiting, GERD, or dysphagia.  Stools dark at times with oral iron 12 however not black.  Only taking daily baby aspirin.  Last visit with hematology completed virtually.  Received iron infusion October 2023, did not note any significant improvement in energy level.  Continues to have intermittent black tarry bowel movements.  No gross hematochezia or hematemesis.  Chronically weak and fatigued.  Has some palpitations intermittent exertional chest pain followed by cardiology.  Intermittent dyspnea on exertion.  Advised to repeat CBC, CMP, and iron panel in 3 months and complete full visit.  Reinforced alarm symptoms and ED precautions.  Felt as though mild CKD stage IIIa may also be contributing to his anemia.  Continues to take iron and B12 supplements at home.  Labs 08/04/2022 with iron 53, saturation 17%, ferritin 38.  B12 within normal limits.  Hemoglobin 12.6.  Today:   Sees hematology Thursday for Iv iron infusion. Having intermittent dark/black stools. Not that often but does happen. Energy level is fair/low. No shortness of breath, chest pains. Does get some shoulder aches. No brbpr, abdominal pain, constipation, diarrhea. Blood sugars had become elevated and was not feeling well. Went a whole week without  insulin and then after a week he felt terrible. Was having low blood sugars in the morning. This morning is blood sugar was 175. Intermittent swelling in legs/feet. No PICA. Denies any palpitations. Taking iron once daily at home. Has continued B12. Only takes baby aspirin, no nsaids. Still  smokes - about 1 pack per day.   GERD is controlled. Taking omeprazole once daily without any breakthrough. No nausea, vomiting, dysphagia. Thinks he got an iron infusion in December.   Wife making him work a lot. Likes peace and quiet.   Current Outpatient Medications  Medication Sig Dispense Refill   albuterol (VENTOLIN HFA) 108 (90 Base) MCG/ACT inhaler Inhale 1-2 puffs into the lungs every 6 (six) hours as needed for wheezing or shortness of breath.     Alcohol Swabs (ALCOHOL WIPES) 70 % PADS Apply topically.     amitriptyline (ELAVIL) 50 MG tablet TAKE ONE TABLET BY MOUTH EVERY NIGHT FOR NERVE PAIN. NOTE INCREASED STRENGTH     amLODipine (NORVASC) 10 MG tablet Take 1 tablet (10 mg total) by mouth daily. 30 tablet 5   aspirin 81 MG tablet Take 1 tablet (81 mg total) by mouth daily with breakfast. 30 tablet 1   cholecalciferol (VITAMIN D3) 25 MCG (1000 UNIT) tablet Take 1,000 Units by mouth every morning.     dorzolamide-timolol (COSOPT) 22.3-6.8 MG/ML ophthalmic solution Place 1 drop into the right eye in the morning.     ferrous sulfate 325 (65 FE) MG tablet Take 325 mg by mouth daily with breakfast.     finasteride (PROSCAR) 5 MG tablet Take 5 mg by mouth at bedtime.     gabapentin (NEURONTIN) 100 MG capsule Take 200 mg by mouth 2 (two) times daily.      insulin glargine-yfgn (SEMGLEE) 100 UNIT/ML injection      losartan (COZAAR) 25 MG tablet Take 1 tablet (25 mg total) by mouth daily. 90 tablet 2   nitroGLYCERIN (NITROSTAT) 0.4 MG SL tablet Place 1 tablet (0.4 mg total) under the tongue every 5 (five) minutes as needed for chest pain. 25 tablet 3   Omega-3 Fatty Acids (FISH OIL) 1000 MG CAPS Take 3 capsules by mouth 2 (two) times daily before a meal.     omeprazole (PRILOSEC) 20 MG capsule Take 1 capsule (20 mg total) by mouth daily. 90 capsule 3   oxyCODONE-acetaminophen (PERCOCET) 5-325 MG tablet Take 1 tablet by mouth every 6 (six) hours as needed for severe pain. 8 tablet 0    pioglitazone (ACTOS) 45 MG tablet TAKE ONE TABLET BY MOUTH EVERY DAY FOR DIABETES MELLITUS     pravastatin (PRAVACHOL) 80 MG tablet Take 1 tablet (80 mg total) by mouth daily. 90 tablet 1   prazosin (MINIPRESS) 2 MG capsule Take 2 mg by mouth at bedtime.     torsemide (DEMADEX) 20 MG tablet Take 1 tablet by mouth daily.     traZODone (DESYREL) 50 MG tablet Take 50 mg by mouth at bedtime.     No current facility-administered medications for this visit.    Past Medical History:  Diagnosis Date   Anemia    ASCVD (arteriosclerotic cardiovascular disease)    S/P CABG surgery  in 2004; cardiac cath in 2005 revealed patent grafts.   BPH (benign prostatic hypertrophy)    Carotid artery occlusion    Cerebrovascular disease    bilateral bruits   Coronary artery disease    DM (diabetes mellitus) (HCC)    AODM-- insulin requiring for the past 20yrs  Hx of adenomatous colonic polyps    Hyperlipidemia    Hypertension    IDA (iron deficiency anemia)    Pneumonia 2020   Tobacco abuse    50 pack years; current 1 pack per day    Past Surgical History:  Procedure Laterality Date   BIOPSY  01/28/2020   Procedure: BIOPSY;  Surgeon: Daneil Dolin, MD;  Location: [facility];  Service: Endoscopy;;   CERVICAL DISCECTOMY  1996   anterior cervical spine discectomy and fusion   CHOLECYSTECTOMY  06/17/2003   by Georgina Quint MD   COLONOSCOPY W/ POLYPECTOMY     4 adenomatous polyps   COLONOSCOPY WITH PROPOFOL N/A 01/29/2020   normal colon, normal appearing TI.    CORONARY ARTERY BYPASS GRAFT     ENDARTERECTOMY Right 12/08/2020   Procedure: RIGHT CAROTID ENDARTERECTOMY;  Surgeon: Rosetta Posner, MD;  Location: [facility];  Service: Vascular;  Laterality: Right;   ESOPHAGOGASTRODUODENOSCOPY  07/04/2007   Dr. Oneida Alar; normal esophagus, erythema/edema in gastric antrum s/p biopsied, duodenitis.  Pathology positive for H. pylori.   ESOPHAGOGASTRODUODENOSCOPY (EGD) WITH PROPOFOL N/A 01/28/2020    mild  erosive reflux esophagitis, s/p biopsy of gastric mucosa, normal duodenum overall except for minimally eroded second portion of mucosa s/p biopsy. Pathology: slight chronic gastritis, negative H.pylori, benign duodenal mucosa, no sprue.    GIVENS CAPSULE STUDY N/A 02/26/2020   Procedure: GIVENS CAPSULE STUDY;  Surgeon: Eloise Harman, DO;  Location: [facility];  Service: Endoscopy;  Laterality: N/A;  7:30am   GIVENS CAPSULE STUDY N/A 03/12/2020   Surgeon: Daneil Dolin, MD;  blood flecks/wisp of blood in the stomach without obvious source, possible erosions/ulcers in the small bowel, but no active bleeding.   PATCH ANGIOPLASTY Right 12/08/2020   Procedure: PATCH ANGIOPLASTY HEMASHIELD TO RIGHT INTERNAL CAROTID ARTERY;  Surgeon: Rosetta Posner, MD;  Location: [facility] OR;  Service: Vascular;  Laterality: Right;    Family History  Problem Relation Age of Onset   Pneumonia Mother 33       complications of diabetes   Diabetes Mother    Pneumonia Father 20       complication of pneumonia   Cancer Sister        breast   Cancer Sister        breast cancer   Colon cancer Neg Hx     Allergies as of 08/30/2022 - Review Complete 08/30/2022  Allergen Reaction Noted   Lisinopril Cough 03/20/2007    Social History   Socioeconomic History   Marital status: Married    Spouse name: valeria   Number of children: 3   Years of education: Not on file   Highest education level: Not on file  Occupational History   Occupation: disabled  Tobacco Use   Smoking status: Every Day    Packs/day: 1.50    Years: 50.00    Total pack years: 75.00    Types: Cigarettes    Start date: 09/05/1960    Passive exposure: Current   Smokeless tobacco: Never  Vaping Use   Vaping Use: Never used  Substance and Sexual Activity   Alcohol use: No    Comment: quit 1980's   Drug use: No   Sexual activity: Not on file  Other Topics Concern   Not on file  Social History Narrative   Not on file   Social  Determinants of Health   Financial Resource Strain: Low Risk  (09/28/2020)   Overall Emergency planning/management officer Strain (  CARDIA)    Difficulty of Paying Living Expenses: Not hard at all  Food Insecurity: No Food Insecurity (09/28/2020)   Hunger Vital Sign    Worried About Running Out of Food in the Last Year: Never true    Ran Out of Food in the Last Year: Never true  Transportation Needs: No Transportation Needs (09/28/2020)   PRAPARE - Hydrologist (Medical): No    Lack of Transportation (Non-Medical): No  Physical Activity: Inactive (09/28/2020)   Exercise Vital Sign    Days of Exercise per Week: 0 days    Minutes of Exercise per Session: 0 min  Stress: No Stress Concern Present (09/28/2020)   [place]    Feeling of Stress : Only a little  Social Connections: Socially Integrated (09/28/2020)   Social Connection and Isolation Panel [NHANES]    Frequency of Communication with Friends and Family: More than three times a week    Frequency of Social Gatherings with Friends and Family: More than three times a week    Attends Religious Services: 1 to 4 times per year    Active Member of [employer] or Organizations: Yes    Attends Music therapist: More than 4 times per year    Marital Status: Married     Review of Systems   Gen: Denies fever, chills, anorexia. Denies fatigue, weakness, weight loss.  CV: Denies chest pain, palpitations, syncope, peripheral edema, and claudication. Resp: Denies dyspnea at rest, cough, wheezing, coughing up blood, and pleurisy. GI: See HPI Derm: Denies rash, itching, dry skin Psych: Denies depression, anxiety, memory loss, confusion. No homicidal or suicidal ideation.  Heme: Denies bruising, bleeding, and enlarged lymph nodes.   Physical Exam   BP 117/68 (BP Location: Left Arm, Patient Position: Sitting, Cuff Size: Large)   Pulse 87   Temp (!) 97.1 F (36.2 C)  (Temporal)   Ht 6\' 1"  (1.854 m)   Wt 246 lb 4.8 oz (111.7 kg)   BMI 32.50 kg/m   General:   Alert and oriented. No distress noted. Pleasant and cooperative.  Head:  Normocephalic and atraumatic. Eyes:  Conjuctiva clear without scleral icterus. Mouth:  Oral mucosa pink and moist. Good dentition. No lesions. Lungs:  Clear to auscultation bilaterally. No wheezes, rales, or rhonchi. No distress.  Heart:  S1, S2 present without murmurs appreciated.  Abdomen:  +BS, soft, non-tender and non-distended. No rebound or guarding. No HSM or masses noted. Rectal: deferred Msk:  Symmetrical without gross deformities. Normal posture. Extremities:  Without edema. Neurologic:  Alert and  oriented x4 Psych:  Alert and cooperative. Normal mood and affect.   Assessment  Kyle Daniel is a 77 y.o. male with a history of IDA - transfusion dependent, CAD s/p CABG in 2004, BPH, diabetes, HLD HTN, and adenomatous colon polyps presenting today for follow up.   Iron deficiency anemia: History of profound anemia in the setting of GI bleeding.  Has had multiple hospitalizations in 2020 and 2021 where he received multiple blood products.  Had full workup with EGD, colonoscopy, and capsule study in 2021.  EGD and colonoscopy in 2021 did not show any obvious source of bleeding.  On capsule study there were wisp of blood without obvious source in multiple areas in the small bowel with possible lymphangiectasia's and erosions/ulcers without active bleeding.  Chronically has been on aspirin therapy given cardiac history.  After capsule study was referred to hematology and has  been receiving intermittent iron infusions.  Iron deficiency anemia may be multifactorial in the setting of chronic kidney disease as well which could be contributing to his chronic fatigue.  Also has been having intermittent black stools, but does not occur frequently.  Currently denies any hematochezia, hematemesis, abdominal pain, nausea/vomiting, pica,  shortness of breath.  Due for follow-up IV iron infusion Thursday this week.  Daniel recent blood work with hemoglobin stable at 12.6, iron 53, ferritin 38  (lower end of normal), with lower iron saturation at 17%.  He will continue with oral iron therapy, following with hematology for IV iron infusions intermittently, and omeprazole 20 mg once daily.  He is aware to avoid all NSAIDs.  GERD: Controlled.  Currently taking omeprazole 20 mg once daily.  Denies any nausea, vomiting, lack of appetite, dysphagia.  Typically has never had any reflux symptoms.  Does have a history of esophagitis on EGD in 2021.  PLAN    Continue omeprazole 20 mg once daily, 30 minutes prior to breakfast. Avoid NSAIDs Continue to follow with hematology for intermittent iron. Continue daily oral iron therapy and B12. Monitor for black/tarry stools or rectal bleeding. Follow-up in 6 months, sooner if needed.    Brooke Bonito, MSN, FNP-BC, AGACNP-BC [facility]

## 2022-08-30 ENCOUNTER — Encounter: Payer: Self-pay | Admitting: Gastroenterology

## 2022-08-30 ENCOUNTER — Ambulatory Visit (INDEPENDENT_AMBULATORY_CARE_PROVIDER_SITE_OTHER): Payer: Medicare Other | Admitting: Gastroenterology

## 2022-08-30 VITALS — BP 117/68 | HR 87 | Temp 97.1°F | Ht 73.0 in | Wt 246.3 lb

## 2022-08-30 DIAGNOSIS — K2100 Gastro-esophageal reflux disease with esophagitis, without bleeding: Secondary | ICD-10-CM

## 2022-08-30 DIAGNOSIS — D509 Iron deficiency anemia, unspecified: Secondary | ICD-10-CM

## 2022-08-30 NOTE — Patient Instructions (Addendum)
Continue to avoid all NSAID products including ibuprofen, Aleve, Advil, BC powders, Goody powders, and anything that says "NSAID" on the package.   Continue omeprazole 20 mg once daily.  This is to help protect your GI tract in the setting of daily aspirin use and your history of esophagitis on upper endoscopy.  Continue to follow with hematology for intermittent iron infusions.  If you develop any frequent black/tarry stools or bright red blood in your stools or develop any chest pain, or worsening shortness of breath please contact the office.  It was a pleasure to see you today. I want to create trusting relationships with patients. If you receive a survey regarding your visit,  I greatly appreciate you taking time to fill this out on paper or through your MyChart. I value your feedback.  Venetia Night, MSN, FNP-BC, AGACNP-BC [facility]

## 2022-09-01 ENCOUNTER — Ambulatory Visit: Payer: Non-veteran care | Admitting: Internal Medicine

## 2022-09-01 ENCOUNTER — Inpatient Hospital Stay: Payer: Medicare Other | Attending: Hematology

## 2022-09-01 VITALS — BP 138/69 | HR 64 | Temp 97.8°F | Resp 18

## 2022-09-01 DIAGNOSIS — D500 Iron deficiency anemia secondary to blood loss (chronic): Secondary | ICD-10-CM

## 2022-09-01 DIAGNOSIS — D509 Iron deficiency anemia, unspecified: Secondary | ICD-10-CM | POA: Insufficient documentation

## 2022-09-01 DIAGNOSIS — K922 Gastrointestinal hemorrhage, unspecified: Secondary | ICD-10-CM

## 2022-09-01 MED ORDER — LORATADINE 10 MG PO TABS: 10.0000 mg | ORAL_TABLET | Freq: Once | ORAL | Status: DC

## 2022-09-01 MED ORDER — SODIUM CHLORIDE 0.9 % IV SOLN: Freq: Once | INTRAVENOUS | Status: AC

## 2022-09-01 MED ORDER — CETIRIZINE HCL 10 MG PO TABS
10.0000 mg | ORAL_TABLET | Freq: Once | ORAL | Status: AC
  Administered 2022-09-01: 10 mg via ORAL
  Filled 2022-09-01: qty 1

## 2022-09-01 MED ORDER — ACETAMINOPHEN 325 MG PO TABS
650.0000 mg | ORAL_TABLET | Freq: Once | ORAL | Status: AC
  Administered 2022-09-01: 650 mg via ORAL
  Filled 2022-09-01: qty 2

## 2022-09-01 MED ORDER — SODIUM CHLORIDE 0.9 % IV SOLN
510.0000 mg | Freq: Once | INTRAVENOUS | Status: AC
  Administered 2022-09-01: 510 mg via INTRAVENOUS
  Filled 2022-09-01: qty 510

## 2022-09-01 NOTE — Progress Notes (Signed)
Patient tolerated iron infusion with no complaints voiced. Peripheral IV site clean and dry with good blood return noted before and after infusion. Patient refused to wait full 30 minute wait time. Band aid applied. VSS with discharge and left in satisfactory condition with no s/s of distress noted.

## 2022-09-01 NOTE — Patient Instructions (Signed)
[facility]  Discharge Instructions: Thank you for choosing [facility] to provide your oncology and hematology care.  If you have a lab appointment with the [facility], please come in thru the Main Entrance and check in at the main information desk.  Wear comfortable clothing and clothing appropriate for easy access to any Portacath or PICC line.   We strive to give you quality time with your provider. You may need to reschedule your appointment if you arrive late (15 or more minutes).  Arriving late affects you and other patients whose appointments are after yours.  Also, if you miss three or more appointments without notifying the office, you may be dismissed from the clinic at the provider's discretion.      For prescription refill requests, have your pharmacy contact our office and allow 72 hours for refills to be completed.    Today you received the following Feraheme, return as scheduled.   To help prevent nausea and vomiting after your treatment, we encourage you to take your nausea medication as directed.  BELOW ARE SYMPTOMS THAT SHOULD BE REPORTED IMMEDIATELY: *FEVER GREATER THAN 100.4 F (38 C) OR HIGHER *CHILLS OR SWEATING *NAUSEA AND VOMITING THAT IS NOT CONTROLLED WITH YOUR NAUSEA MEDICATION *UNUSUAL SHORTNESS OF BREATH *UNUSUAL BRUISING OR BLEEDING *URINARY PROBLEMS (pain or burning when urinating, or frequent urination) *BOWEL PROBLEMS (unusual diarrhea, constipation, pain near the anus) TENDERNESS IN MOUTH AND THROAT WITH OR WITHOUT PRESENCE OF ULCERS (sore throat, sores in mouth, or a toothache) UNUSUAL RASH, SWELLING OR PAIN  UNUSUAL VAGINAL DISCHARGE OR ITCHING   Items with * indicate a potential emergency and should be followed up as soon as possible or go to the Emergency Department if any problems should occur.  Please show the CHEMOTHERAPY ALERT CARD or IMMUNOTHERAPY ALERT CARD at check-in to the Emergency Department and  triage nurse.  Should you have questions after your visit or need to cancel or reschedule your appointment, please contact [facility] [phone]  and follow the prompts.  Office hours are 8:00 a.m. to 4:30 p.m. Monday - Friday. Please note that voicemails left after 4:00 p.m. may not be returned until the following business day.  We are closed weekends and major holidays. You have access to a nurse at all times for urgent questions. Please call the main number to the clinic [phone] and follow the prompts.  For any non-urgent questions, you may also contact your provider using MyChart. We now offer e-Visits for anyone 72 and older to request care online for non-urgent symptoms. For details visit [email or web address].   Also download the MyChart app! Go to the app store, search "MyChart", open the app, select [facility], and log in with your MyChart username and password.

## 2022-09-09 ENCOUNTER — Inpatient Hospital Stay: Payer: Medicare Other

## 2022-09-09 VITALS — BP 126/60 | HR 69 | Temp 97.4°F | Resp 18

## 2022-09-09 DIAGNOSIS — K922 Gastrointestinal hemorrhage, unspecified: Secondary | ICD-10-CM

## 2022-09-09 DIAGNOSIS — D509 Iron deficiency anemia, unspecified: Secondary | ICD-10-CM | POA: Diagnosis not present

## 2022-09-09 DIAGNOSIS — D500 Iron deficiency anemia secondary to blood loss (chronic): Secondary | ICD-10-CM

## 2022-09-09 MED ORDER — SODIUM CHLORIDE 0.9 % IV SOLN
510.0000 mg | Freq: Once | INTRAVENOUS | Status: AC
  Administered 2022-09-09: 510 mg via INTRAVENOUS
  Filled 2022-09-09: qty 510

## 2022-09-09 MED ORDER — SODIUM CHLORIDE 0.9 % IV SOLN: Freq: Once | INTRAVENOUS | Status: AC

## 2022-09-09 MED ORDER — CETIRIZINE HCL 10 MG PO TABS
10.0000 mg | ORAL_TABLET | Freq: Once | ORAL | Status: AC
  Administered 2022-09-09: 10 mg via ORAL
  Filled 2022-09-09: qty 1

## 2022-09-09 MED ORDER — ACETAMINOPHEN 325 MG PO TABS
650.0000 mg | ORAL_TABLET | Freq: Once | ORAL | Status: AC
  Administered 2022-09-09: 650 mg via ORAL
  Filled 2022-09-09: qty 2

## 2022-09-09 NOTE — Progress Notes (Signed)
Patient left without completing 30 minute monitoring post iron. Patient teaching performed. Understanding verified.

## 2022-09-09 NOTE — Progress Notes (Signed)
Patient presents today for Feraheme infusion. Vital signs stable. Patient has no complaints today.   Feraheme given today per MD orders. Tolerated infusion without adverse affects. Vital signs stable. No complaints at this time. Discharged from clinic ambulatory in stable condition. Alert and oriented x 3. F/U with [facility] as scheduled.

## 2022-09-09 NOTE — Patient Instructions (Signed)
[facility]  Discharge Instructions: Thank you for choosing [facility] to provide your oncology and hematology care.  If you have a lab appointment with the [facility], please come in thru the Main Entrance and check in at the main information desk.  Wear comfortable clothing and clothing appropriate for easy access to any Portacath or PICC line.   We strive to give you quality time with your provider. You may need to reschedule your appointment if you arrive late (15 or more minutes).  Arriving late affects you and other patients whose appointments are after yours.  Also, if you miss three or more appointments without notifying the office, you may be dismissed from the clinic at the provider's discretion.      For prescription refill requests, have your pharmacy contact our office and allow 72 hours for refills to be completed.    Today you received the following chemotherapy and/or immunotherapy agents Feraheme.Ferumoxytol Injection What is this medication? FERUMOXYTOL (FER ue MOX i tol) treats low levels of iron in your body (iron deficiency anemia). Iron is a mineral that plays an important role in making red blood cells, which carry oxygen from your lungs to the rest of your body. This medicine may be used for other purposes; ask your health care provider or pharmacist if you have questions. COMMON BRAND NAME(S): Feraheme What should I tell my care team before I take this medication? They need to know if you have any of these conditions: Anemia not caused by low iron levels High levels of iron in the blood Magnetic resonance imaging (MRI) test scheduled An unusual or allergic reaction to iron, other medications, foods, dyes, or preservatives Pregnant or trying to get pregnant Breastfeeding How should I use this medication? This medication is injected into a vein. It is given by your care team in a hospital or clinic setting. Talk to your care team  the use of this medication in children. Special care may be needed. Overdosage: If you think you have taken too much of this medicine contact a poison control center or emergency room at once. NOTE: This medicine is only for you. Do not share this medicine with others. What if I miss a dose? It is important not to miss your dose. Call your care team if you are unable to keep an appointment. What may interact with this medication? Other iron products This list may not describe all possible interactions. Give your health care provider a list of all the medicines, herbs, non-prescription drugs, or dietary supplements you use. Also tell them if you smoke, drink alcohol, or use illegal drugs. Some items may interact with your medicine. What should I watch for while using this medication? Visit your care team regularly. Tell your care team if your symptoms do not start to get better or if they get worse. You may need blood work done while you are taking this medication. You may need to follow a special diet. Talk to your care team. Foods that contain iron include: whole grains/cereals, dried fruits, beans, or peas, leafy green vegetables, and organ meats (liver, kidney). What side effects may I notice from receiving this medication? Side effects that you should report to your care team as soon as possible: Allergic reactions--skin rash, itching, hives, swelling of the face, lips, tongue, or throat Low blood pressure--dizziness, feeling faint or lightheaded, blurry vision Shortness of breath Side effects that usually do not require medical attention (report to your care  team if they continue or are bothersome): Flushing Headache Joint pain Muscle pain Nausea Pain, redness, or irritation at injection site This list may not describe all possible side effects. Call your doctor for medical advice about side effects. You may report side effects to FDA at 1-800-FDA-1088. Where should I keep my  medication? This medication is given in a hospital or clinic and will not be stored at home. NOTE: This sheet is a summary. It may not cover all possible information. If you have questions about this medicine, talk to your doctor, pharmacist, or health care provider.  2023 Elsevier/Gold Standard (2020-12-02 00:00:00)        To help prevent nausea and vomiting after your treatment, we encourage you to take your nausea medication as directed.  BELOW ARE SYMPTOMS THAT SHOULD BE REPORTED IMMEDIATELY: *FEVER GREATER THAN 100.4 F (38 C) OR HIGHER *CHILLS OR SWEATING *NAUSEA AND VOMITING THAT IS NOT CONTROLLED WITH YOUR NAUSEA MEDICATION *UNUSUAL SHORTNESS OF BREATH *UNUSUAL BRUISING OR BLEEDING *URINARY PROBLEMS (pain or burning when urinating, or frequent urination) *BOWEL PROBLEMS (unusual diarrhea, constipation, pain near the anus) TENDERNESS IN MOUTH AND THROAT WITH OR WITHOUT PRESENCE OF ULCERS (sore throat, sores in mouth, or a toothache) UNUSUAL RASH, SWELLING OR PAIN  UNUSUAL VAGINAL DISCHARGE OR ITCHING   Items with * indicate a potential emergency and should be followed up as soon as possible or go to the Emergency Department if any problems should occur.  Please show the CHEMOTHERAPY ALERT CARD or IMMUNOTHERAPY ALERT CARD at check-in to the Emergency Department and triage nurse.  Should you have questions after your visit or need to cancel or reschedule your appointment, please contact [facility] [phone]  and follow the prompts.  Office hours are 8:00 a.m. to 4:30 p.m. Monday - Friday. Please note that voicemails left after 4:00 p.m. may not be returned until the following business day.  We are closed weekends and major holidays. You have access to a nurse at all times for urgent questions. Please call the main number to the clinic [phone] and follow the prompts.  For any non-urgent questions, you may also contact your provider using MyChart. We now  offer e-Visits for anyone 55 and older to request care online for non-urgent symptoms. For details visit [email or web address].   Also download the MyChart app! Go to the app store, search "MyChart", open the app, select [facility], and log in with your MyChart username and password.

## 2022-09-12 ENCOUNTER — Encounter: Payer: Self-pay | Admitting: Hematology

## 2022-09-21 ENCOUNTER — Ambulatory Visit (HOSPITAL_COMMUNITY): Admission: RE | Admit: 2022-09-21 | Payer: Medicare Other | Source: Ambulatory Visit

## 2022-10-18 ENCOUNTER — Ambulatory Visit: Payer: Medicare Other | Attending: Cardiology | Admitting: Cardiology

## 2022-10-18 ENCOUNTER — Encounter: Payer: Self-pay | Admitting: Cardiology

## 2022-10-18 VITALS — BP 122/60 | HR 80 | Ht 73.0 in | Wt 241.0 lb

## 2022-10-18 DIAGNOSIS — I10 Essential (primary) hypertension: Secondary | ICD-10-CM

## 2022-10-18 DIAGNOSIS — I2510 Atherosclerotic heart disease of native coronary artery without angina pectoris: Secondary | ICD-10-CM

## 2022-10-18 DIAGNOSIS — E782 Mixed hyperlipidemia: Secondary | ICD-10-CM | POA: Diagnosis not present

## 2022-10-18 NOTE — Patient Instructions (Signed)
Medication Instructions:  Your physician recommends that you continue on your current medications as directed. Please refer to the Current Medication list given to you today.   Labwork: None  Testing/Procedures: None  Follow-Up: Follow up with Dr. Branch in 1 year.   Any Other Special Instructions Will Be Listed Below (If Applicable).     If you need a refill on your cardiac medications before your next appointment, please call your pharmacy.  

## 2022-10-18 NOTE — Progress Notes (Signed)
Clinical Summary Mr. Elm is a 77 y.o.male seen today for follow up of the following medical problems.    1. CAD - history of CABG - underwent three-vessel coronary artery bypass graft surgery on 12/18/2002 with a LIMA to the LAD, left radial to the ramus intermedius, and SVG to circumflex marginal Nance Mccombs.   2020 nuclear stress: mild ischemia     - no chest pains, no SOB/DOE - compliant with meds       2. Palpitations -previous high caffeine intake.  - rare symptoms     3. HTN - he is compliant with meds       4. Hyperlipidemia - has been on pravastatin 80mg  daily.  - 06/2022 TC 121 TG 131 HDL 33 LDL 65   5. Cartoid stenosis. - followed by vascular - prior right CEA May 2022 - upcoming appt 10/2022   5. Anemia, heme + stool - July 2021 Hgb 5.6 on admission. colonoscopy and EGD during admission without obvious source for bleeding - followed by GI and hematology -remains on IV iron.  Past Medical History:  Diagnosis Date   Anemia    ASCVD (arteriosclerotic cardiovascular disease)    S/P CABG surgery  in 2004; cardiac cath in 2005 revealed patent grafts.   BPH (benign prostatic hypertrophy)    Carotid artery occlusion    Cerebrovascular disease    bilateral bruits   Coronary artery disease    DM (diabetes mellitus) (HCC)    AODM-- insulin requiring for the past 28yrs   Hx of adenomatous colonic polyps    Hyperlipidemia    Hypertension    IDA (iron deficiency anemia)    Pneumonia 2020   Tobacco abuse    50 pack years; current 1 pack per day     Allergies  Allergen Reactions   Lisinopril Cough     Current Outpatient Medications  Medication Sig Dispense Refill   albuterol (VENTOLIN HFA) 108 (90 Base) MCG/ACT inhaler Inhale 1-2 puffs into the lungs every 6 (six) hours as needed for wheezing or shortness of breath.     Alcohol Swabs (ALCOHOL WIPES) 70 % PADS Apply topically.     amitriptyline (ELAVIL) 50 MG tablet TAKE ONE TABLET BY MOUTH EVERY  NIGHT FOR NERVE PAIN. NOTE INCREASED STRENGTH     amLODipine (NORVASC) 10 MG tablet Take 1 tablet (10 mg total) by mouth daily. 30 tablet 5   aspirin 81 MG tablet Take 1 tablet (81 mg total) by mouth daily with breakfast. 30 tablet 1   cholecalciferol (VITAMIN D3) 25 MCG (1000 UNIT) tablet Take 1,000 Units by mouth every morning.     dorzolamide-timolol (COSOPT) 22.3-6.8 MG/ML ophthalmic solution Place 1 drop into the right eye in the morning.     ferrous sulfate 325 (65 FE) MG tablet Take 325 mg by mouth daily with breakfast.     finasteride (PROSCAR) 5 MG tablet Take 5 mg by mouth at bedtime.     gabapentin (NEURONTIN) 100 MG capsule Take 200 mg by mouth 2 (two) times daily.      insulin glargine-yfgn (SEMGLEE) 100 UNIT/ML injection      losartan (COZAAR) 25 MG tablet Take 1 tablet (25 mg total) by mouth daily. 90 tablet 2   metFORMIN (GLUCOPHAGE-XR) 500 MG 24 hr tablet Take 1 tablet by mouth 2 (two) times daily.     nitroGLYCERIN (NITROSTAT) 0.4 MG SL tablet Place 1 tablet (0.4 mg total) under the tongue every 5 (five) minutes as  needed for chest pain. 25 tablet 3   Omega-3 Fatty Acids (FISH OIL) 1000 MG CAPS Take 3 capsules by mouth 2 (two) times daily before a meal.     omeprazole (PRILOSEC) 20 MG capsule Take 1 capsule (20 mg total) by mouth daily. 90 capsule 3   oxyCODONE-acetaminophen (PERCOCET) 5-325 MG tablet Take 1 tablet by mouth every 6 (six) hours as needed for severe pain. 8 tablet 0   pioglitazone (ACTOS) 45 MG tablet TAKE ONE TABLET BY MOUTH EVERY DAY FOR DIABETES MELLITUS     pravastatin (PRAVACHOL) 80 MG tablet Take 1 tablet (80 mg total) by mouth daily. 90 tablet 1   prazosin (MINIPRESS) 2 MG capsule Take 2 mg by mouth at bedtime.     torsemide (DEMADEX) 20 MG tablet Take 1 tablet by mouth daily.     traZODone (DESYREL) 50 MG tablet Take 50 mg by mouth at bedtime.     No current facility-administered medications for this visit.     Past Surgical History:  Procedure  Laterality Date   BIOPSY  01/28/2020   Procedure: BIOPSY;  Surgeon: Daneil Dolin, MD;  Location: [facility];  Service: Endoscopy;;   CERVICAL DISCECTOMY  1996   anterior cervical spine discectomy and fusion   CHOLECYSTECTOMY  06/17/2003   by Georgina Quint MD   COLONOSCOPY W/ POLYPECTOMY     4 adenomatous polyps   COLONOSCOPY WITH PROPOFOL N/A 01/29/2020   normal colon, normal appearing TI.    CORONARY ARTERY BYPASS GRAFT     ENDARTERECTOMY Right 12/08/2020   Procedure: RIGHT CAROTID ENDARTERECTOMY;  Surgeon: Rosetta Posner, MD;  Location: [facility] OR;  Service: Vascular;  Laterality: Right;   ESOPHAGOGASTRODUODENOSCOPY  07/04/2007   Dr. Oneida Alar; normal esophagus, erythema/edema in gastric antrum s/p biopsied, duodenitis.  Pathology positive for H. pylori.   ESOPHAGOGASTRODUODENOSCOPY (EGD) WITH PROPOFOL N/A 01/28/2020    mild erosive reflux esophagitis, s/p biopsy of gastric mucosa, normal duodenum overall except for minimally eroded second portion of mucosa s/p biopsy. Pathology: slight chronic gastritis, negative H.pylori, benign duodenal mucosa, no sprue.    GIVENS CAPSULE STUDY N/A 02/26/2020   Procedure: GIVENS CAPSULE STUDY;  Surgeon: Eloise Harman, DO;  Location: [facility];  Service: Endoscopy;  Laterality: N/A;  7:30am   GIVENS CAPSULE STUDY N/A 03/12/2020   Surgeon: Daneil Dolin, MD;  blood flecks/wisp of blood in the stomach without obvious source, possible erosions/ulcers in the small bowel, but no active bleeding.   PATCH ANGIOPLASTY Right 12/08/2020   Procedure: PATCH ANGIOPLASTY HEMASHIELD TO RIGHT INTERNAL CAROTID ARTERY;  Surgeon: Rosetta Posner, MD;  Location: [facility];  Service: Vascular;  Laterality: Right;     Allergies  Allergen Reactions   Lisinopril Cough      Family History  Problem Relation Age of Onset   Pneumonia Mother 72       complications of diabetes   Diabetes Mother    Pneumonia Father 79       complication of pneumonia   Cancer Sister         breast   Cancer Sister        breast cancer   Colon cancer Neg Hx      Social History Mr. Bicknese reports that he has been smoking cigarettes. He started smoking about 62 years ago. He has a 75.00 pack-year smoking history. He has been exposed to tobacco smoke. He has never used smokeless tobacco. Mr. Sharp reports no history of alcohol use.  Review of Systems CONSTITUTIONAL: No weight loss, fever, chills, weakness or fatigue.  HEENT: Eyes: No visual loss, blurred vision, double vision or yellow sclerae.No hearing loss, sneezing, congestion, runny nose or sore throat.  SKIN: No rash or itching.  CARDIOVASCULAR: per hpi RESPIRATORY: No shortness of breath, cough or sputum.  GASTROINTESTINAL: No anorexia, nausea, vomiting or diarrhea. No abdominal pain or blood.  GENITOURINARY: No burning on urination, no polyuria NEUROLOGICAL: No headache, dizziness, syncope, paralysis, ataxia, numbness or tingling in the extremities. No change in bowel or bladder control.  MUSCULOSKELETAL: No muscle, back pain, joint pain or stiffness.  LYMPHATICS: No enlarged nodes. No history of splenectomy.  PSYCHIATRIC: No history of depression or anxiety.  ENDOCRINOLOGIC: No reports of sweating, cold or heat intolerance. No polyuria or polydipsia.  Marland Kitchen   Physical Examination Today's Vitals   10/18/22 1011  BP: 122/60  Pulse: 80  SpO2: 95%  Weight: 241 lb (109.3 kg)  Height: 6\' 1"  (1.854 m)   Body mass index is 31.8 kg/m.  Gen: resting comfortably, no acute distress HEENT: no scleral icterus, pupils equal round and reactive, no palptable cervical adenopathy,  CV: RRR, no m/rg, no jvd Resp: Clear to auscultation bilaterally GI: abdomen is soft, non-tender, non-distended, normal bowel sounds, no hepatosplenomegaly MSK: extremities are warm, no edema.  Skin: warm, no rash Neuro:  no focal deficits Psych: appropriate affect   Diagnostic Studies Echocardiogram 05/31/2019:   1. Left ventricular  ejection fraction, by visual estimation, is 60 to  65%. The left ventricle has normal function. There is moderately increased  left ventricular hypertrophy.   2. Left ventricular diastolic parameters are consistent with Grade II  diastolic dysfunction (pseudonormalization).   3. Global right ventricle has normal systolic function.The right  ventricular size is normal. No increase in right ventricular wall  thickness.   4. Left atrial size was mildly dilated.   5. Right atrial size was normal.   6. Presence of pericardial fat pad.   7. Mild aortic valve annular calcification.   8. Mild to moderate mitral annular calcification.   9. The mitral valve is grossly normal. Trace mitral valve regurgitation.  10. The tricuspid valve is grossly normal. Tricuspid valve regurgitation  is trivial.  11. The aortic valve is tricuspid. Aortic valve regurgitation is not  visualized.  12. The pulmonic valve was grossly normal. Pulmonic valve regurgitation is  trivial.  13. TR signal is inadequate for assessing pulmonary artery systolic  pressure.  14. The inferior vena cava is normal in size with greater than 50%  respiratory variability, suggesting right atrial pressure of 3 mmHg.    09/2020 carotid US IMPRESSION: At least 50-69% stenosis of the internal carotid arteries bilaterally. There is long segment shadowing plaque within the distal common and proximal internal carotid arteries bilaterally which may be obscuring regions of higher velocity/stenosis. Further evaluation with CT angiography of the neck should be considered to more definitively characterize degree of stenosis.   These results will be called to the ordering clinician or representative by the Radiologist Assistant, and communication documented in the PACS or [employer].    Assessment and Plan  1. CAD - no recent symptoms, continue current meds   2. HTN - his bp is at goal, continue current meds     3.  Hyperlipidemia - LDL at goal, continue current meds    F/u 1 year  Arnoldo Lenis, M.D.

## 2022-11-09 ENCOUNTER — Ambulatory Visit: Payer: Medicare Other | Admitting: Vascular Surgery

## 2022-11-09 NOTE — Progress Notes (Signed)
Patient did not complete LDCT as scheduled, reminder letter mailed to patient's listed mailing address asking that the patient call me to schedule follow-up.  

## 2022-11-10 ENCOUNTER — Inpatient Hospital Stay: Payer: Medicare Other | Attending: Hematology

## 2022-11-10 DIAGNOSIS — D72819 Decreased white blood cell count, unspecified: Secondary | ICD-10-CM | POA: Diagnosis not present

## 2022-11-10 DIAGNOSIS — N1831 Chronic kidney disease, stage 3a: Secondary | ICD-10-CM | POA: Diagnosis not present

## 2022-11-10 DIAGNOSIS — F17200 Nicotine dependence, unspecified, uncomplicated: Secondary | ICD-10-CM | POA: Insufficient documentation

## 2022-11-10 DIAGNOSIS — D509 Iron deficiency anemia, unspecified: Secondary | ICD-10-CM | POA: Insufficient documentation

## 2022-11-10 DIAGNOSIS — D500 Iron deficiency anemia secondary to blood loss (chronic): Secondary | ICD-10-CM

## 2022-11-10 LAB — CBC WITH DIFFERENTIAL/PLATELET
Abs Immature Granulocytes: 0.02 10*3/uL (ref 0.00–0.07)
Basophils Absolute: 0 10*3/uL (ref 0.0–0.1)
Basophils Relative: 1 %
Eosinophils Absolute: 0.1 10*3/uL (ref 0.0–0.5)
Eosinophils Relative: 3 %
HCT: 36.7 % — ABNORMAL LOW (ref 39.0–52.0)
Hemoglobin: 12.3 g/dL — ABNORMAL LOW (ref 13.0–17.0)
Immature Granulocytes: 1 %
Lymphocytes Relative: 36 %
Lymphs Abs: 1.3 10*3/uL (ref 0.7–4.0)
MCH: 29.1 pg (ref 26.0–34.0)
MCHC: 33.5 g/dL (ref 30.0–36.0)
MCV: 86.8 fL (ref 80.0–100.0)
Monocytes Absolute: 0.2 10*3/uL (ref 0.1–1.0)
Monocytes Relative: 6 %
Neutro Abs: 2 10*3/uL (ref 1.7–7.7)
Neutrophils Relative %: 53 %
Platelets: 215 10*3/uL (ref 150–400)
RBC: 4.23 MIL/uL (ref 4.22–5.81)
RDW: 14.4 % (ref 11.5–15.5)
WBC: 3.7 10*3/uL — ABNORMAL LOW (ref 4.0–10.5)
nRBC: 0 % (ref 0.0–0.2)

## 2022-11-10 LAB — COMPREHENSIVE METABOLIC PANEL
ALT: 15 U/L (ref 0–44)
AST: 16 U/L (ref 15–41)
Albumin: 3.6 g/dL (ref 3.5–5.0)
Alkaline Phosphatase: 40 U/L (ref 38–126)
Anion gap: 11 (ref 5–15)
BUN: 11 mg/dL (ref 8–23)
CO2: 23 mmol/L (ref 22–32)
Calcium: 8.3 mg/dL — ABNORMAL LOW (ref 8.9–10.3)
Chloride: 102 mmol/L (ref 98–111)
Creatinine, Ser: 1.32 mg/dL — ABNORMAL HIGH (ref 0.61–1.24)
GFR, Estimated: 56 mL/min — ABNORMAL LOW (ref >60)
Glucose, Bld: 133 mg/dL — ABNORMAL HIGH (ref 70–99)
Potassium: 3.7 mmol/L (ref 3.5–5.1)
Sodium: 136 mmol/L (ref 135–145)
Total Bilirubin: 0.2 mg/dL — ABNORMAL LOW (ref 0.3–1.2)
Total Protein: 6.6 g/dL (ref 6.5–8.1)

## 2022-11-10 LAB — IRON AND TIBC
Iron: 56 ug/dL (ref 45–182)
Saturation Ratios: 21 % (ref 17.9–39.5)
TIBC: 265 ug/dL (ref 250–450)
UIBC: 209 ug/dL

## 2022-11-10 LAB — FERRITIN: Ferritin: 150 ng/mL (ref 24–336)

## 2022-11-16 ENCOUNTER — Ambulatory Visit (INDEPENDENT_AMBULATORY_CARE_PROVIDER_SITE_OTHER): Payer: Medicare Other | Admitting: Vascular Surgery

## 2022-11-16 ENCOUNTER — Other Ambulatory Visit: Payer: Self-pay

## 2022-11-16 ENCOUNTER — Ambulatory Visit: Payer: Medicare Other

## 2022-11-16 ENCOUNTER — Encounter: Payer: Self-pay | Admitting: Vascular Surgery

## 2022-11-16 VITALS — BP 111/71 | HR 83 | Temp 97.0°F | Ht 73.0 in | Wt 240.0 lb

## 2022-11-16 DIAGNOSIS — Z9889 Other specified postprocedural states: Secondary | ICD-10-CM

## 2022-11-16 DIAGNOSIS — I6529 Occlusion and stenosis of unspecified carotid artery: Secondary | ICD-10-CM

## 2022-11-16 NOTE — Progress Notes (Unsigned)
VIRTUAL VISIT via TELEPHONE NOTE [facility]   I connected with Kyle Daniel  on 11/17/22 at  12:47 PM by telephone and verified that I am speaking with the correct person using two identifiers.  Location: Patient: Home Provider: [facility]   I discussed the limitations, risks, security and privacy concerns of performing an evaluation and management service by telephone and the availability of in person appointments. I also discussed with the patient that there may be a patient responsible charge related to this service. The patient expressed understanding and agreed to proceed.  REASON FOR VISIT:  Follow-up for iron deficiency anemia   PRIOR THERAPY: PRBC transfusions   CURRENT THERAPY: Intermittent IV iron   INTERVAL HISTORY:  [name] is contacted today for follow-up of  his iron deficiency anemia.  He was last evaluated via telemedicine visit by Rojelio Brenner PA-C and received IV Feraheme x 3 in January / February 2024.  He did not notice any change in his symptoms or improvement in his energy after his IV iron, but overall seems to have some decreased fatigue.  He reports that he still has intermittent black tarry bowel movements, but not as often as before.  No gross hematochezia or hematemesis.  He continues to follow with GI, last seen by NP Brooke Bonito 08/30/2022.  At today's visit, he denies any palpitations, chest pain, or dyspnea.  No pica, restless legs, or syncope.   He has 50% energy and 100% appetite. He endorses that he is maintaining a stable weight.   REVIEW OF SYSTEMS:   Review of Systems  Constitutional:  Positive for malaise/fatigue. Negative for chills, diaphoresis, fever and weight loss.  Respiratory:  Negative for cough and shortness of breath.   Cardiovascular:  Negative for chest pain and palpitations.  Gastrointestinal:  Negative for abdominal pain, blood in stool, melena, nausea and vomiting.  Neurological:  Positive  for headaches. Negative for dizziness and tingling.  Psychiatric/Behavioral:  Negative for depression. The patient is not nervous/anxious.      PHYSICAL EXAM: (per limitations of virtual telephone visit)  The patient is alert and oriented x 3, exhibiting adequate mentation, good mood, and ability to speak in full sentences and execute sound judgement.  ASSESSMENT & PLAN:  1.  Chronic iron deficiency anemia:  - History of GI bleed in October 2020 at [facility] in [place], required 5 units of PRBC. - Admitted to the hospital from 01/27/2020 through 01/29/2020 with dizziness and dyspnea on exertion. Found to have hemoglobin 5.6, received blood transfusions. - EGD on 01/28/2020 and colonoscopy on 01/29/2019 were nonrevealing. - Capsule study on 03/12/2020, stomach with several images of blood flecks or wisps of blood without obvious source.  Multiple images of duodenal, proximal jejunal areas with what appears lymphangiectasia's without active bleeding.  Further along in the study, images with possible erosions/ulcers. - Other nutritional deficiency work-up was negative.  SPEP was negative. - Most recent Feraheme x 3 in January/February 2024 - Most recent labs (11/10/2022): Hgb 12.3/MCV 86.8, ferritin 150, iron saturation 21 % - Intermittent melanotic stool, recently improved - Symptomatic with mild to moderate baseline fatigue - He is taking B12 and iron supplements at home (stools were dark even before starting iron)  - Labs from last year show evidence of mild CKD stage IIIa, which may also be contributing to his anemia. - PLAN: No indication for IV iron at this time - We will repeat CBC/CMP/iron panel in 3 months followed  by phone visit  - Continue to follow with gastroenterologists.  - Patient aware of alarm symptoms that would prompt immediate medical attention at the ED.   2.  Leukopenia, mild - Mild leukopenia since June 2022.  Most recent CBC (11/10/2022) shows WBC 3.7 with ANC 2.0 - Nutritional  panel (08/04/2022) normal folate, copper, B12, MMA.  CMP at baseline.  Normal LDH. - No frequent infections or B symptoms. - PLAN: We will continue surveillance with repeat CBC in 3 months   3.  Smoking history: - Reviewed results of CT chest from 05/26/2020 which was lung RADS 1. - CT chest/low-dose LCS (09/21/2021): Lung-RADS 1, negative - PLAN: He is overdue next annual LDCT from February 2024     4.  Social/family history: - Active smoker for 56 years, 1.5 PPD current smoker - Sisters x 2 with breast cancer -- Reports [employer] exposure  PLAN SUMMARY:  >> Due for LDCT chest for lung cancer screening >> Labs in 3 months (CBC/D, CMP, ferritin, iron/TIBC) >> PHONE visit 1 week after labs     I discussed the assessment and treatment plan with the patient. The patient was provided an opportunity to ask questions and all were answered. The patient agreed with the plan and demonstrated an understanding of the instructions.   The patient was advised to call back or seek an in-person evaluation if the symptoms worsen or if the condition fails to improve as anticipated.  I provided 15 minutes of non-face-to-face time during this encounter.  Carnella Guadalajara, PA-C 11/17/22 12:57 PM

## 2022-11-16 NOTE — Progress Notes (Signed)
[facility] [place]  Patient name: Kyle Daniel MRN: [id number] DOB: May 06, 1946 Sex: male  REASON FOR VISIT: Follow-up carotid disease  HPI: Kyle Daniel is a 77 y.o. male today for follow-up.  He is status post right carotid endarterectomy by myself on 12/08/20 for asymptomatic disease.  He remains asymptomatic.  He had no difficulty with his initial surgery.  Past Medical History:  Diagnosis Date   Anemia    ASCVD (arteriosclerotic cardiovascular disease)    S/P CABG surgery  in 2004; cardiac cath in 2005 revealed patent grafts.   BPH (benign prostatic hypertrophy)    Carotid artery occlusion    Cerebrovascular disease    bilateral bruits   Coronary artery disease    DM (diabetes mellitus)    AODM-- insulin requiring for the past 41yrs   Hx of adenomatous colonic polyps    Hyperlipidemia    Hypertension    IDA (iron deficiency anemia)    Pneumonia 2020   Tobacco abuse    50 pack years; current 1 pack per day    Family History  Problem Relation Age of Onset   Pneumonia Mother 18       complications of diabetes   Diabetes Mother    Pneumonia Father 13       complication of pneumonia   Cancer Sister        breast   Cancer Sister        breast cancer   Colon cancer Neg Hx     SOCIAL HISTORY: Social History   Tobacco Use   Smoking status: Every Day    Packs/day: 1.50    Years: 50.00    Additional pack years: 0.00    Total pack years: 75.00    Types: Cigarettes    Start date: 09/05/1960    Passive exposure: Current   Smokeless tobacco: Never  Substance Use Topics   Alcohol use: No    Comment: quit 1980's    Allergies  Allergen Reactions   Lisinopril Cough    Current Outpatient Medications  Medication Sig Dispense Refill   albuterol (VENTOLIN HFA) 108 (90 Base) MCG/ACT inhaler Inhale 1-2 puffs into the lungs every 6 (six) hours as needed for wheezing or shortness of breath.     Alcohol Swabs  (ALCOHOL WIPES) 70 % PADS Apply topically.     amitriptyline (ELAVIL) 50 MG tablet TAKE ONE TABLET BY MOUTH EVERY NIGHT FOR NERVE PAIN. NOTE INCREASED STRENGTH     amLODipine (NORVASC) 10 MG tablet Take 1 tablet (10 mg total) by mouth daily. 30 tablet 5   aspirin 81 MG tablet Take 1 tablet (81 mg total) by mouth daily with breakfast. 30 tablet 1   cholecalciferol (VITAMIN D3) 25 MCG (1000 UNIT) tablet Take 1,000 Units by mouth every morning.     dorzolamide-timolol (COSOPT) 22.3-6.8 MG/ML ophthalmic solution Place 1 drop into the right eye in the morning.     ferrous sulfate 325 (65 FE) MG tablet Take 325 mg by mouth daily with breakfast.     finasteride (PROSCAR) 5 MG tablet Take 5 mg by mouth at bedtime.     gabapentin (NEURONTIN) 100 MG capsule Take 200 mg by mouth 2 (two) times daily.      Insulin Glargine w/ Trans Port 100 UNIT/ML SOPN Inject 25 units twice a day by subcutaneous route.     insulin glargine-yfgn (SEMGLEE) 100 UNIT/ML injection      losartan (COZAAR) 25 MG tablet Take 1 tablet (  25 mg total) by mouth daily. 90 tablet 2   metFORMIN (GLUCOPHAGE-XR) 500 MG 24 hr tablet Take 1 tablet by mouth 2 (two) times daily.     nitroGLYCERIN (NITROSTAT) 0.4 MG SL tablet Place 1 tablet (0.4 mg total) under the tongue every 5 (five) minutes as needed for chest pain. 25 tablet 3   Omega-3 Fatty Acids (FISH OIL) 1000 MG CAPS Take 3 capsules by mouth 2 (two) times daily before a meal.     omeprazole (PRILOSEC) 20 MG capsule Take 1 capsule (20 mg total) by mouth daily. 90 capsule 3   oxyCODONE-acetaminophen (PERCOCET) 5-325 MG tablet Take 1 tablet by mouth every 6 (six) hours as needed for severe pain. 8 tablet 0   pioglitazone (ACTOS) 45 MG tablet TAKE ONE TABLET BY MOUTH EVERY DAY FOR DIABETES MELLITUS     pravastatin (PRAVACHOL) 80 MG tablet Take 1 tablet (80 mg total) by mouth daily. 90 tablet 1   prazosin (MINIPRESS) 2 MG capsule Take 2 mg by mouth at bedtime.     torsemide (DEMADEX) 20 MG  tablet Take 1 tablet by mouth daily.     traZODone (DESYREL) 50 MG tablet Take 50 mg by mouth at bedtime.     No current facility-administered medications for this visit.    REVIEW OF SYSTEMS:   denotes positive finding,  denotes negative finding Cardiac  Comments:  Chest pain or chest pressure:    Shortness of breath upon exertion:    Short of breath when lying flat:    Irregular heart rhythm:        Vascular    Pain in calf, thigh, or hip brought on by ambulation:    Pain in feet at night that wakes you up from your sleep:     Blood clot in your veins:    Leg swelling:           PHYSICAL EXAM: Vitals:   11/16/22 1256 11/16/22 1259  BP: 117/72 111/71  Pulse: 83   Temp: (!) 97 F (36.1 C)   SpO2: 97%   Weight: 240 lb (108.9 kg)   Height:  (1.854 m)     GENERAL: The patient is a well-nourished male, in no acute distress. The vital signs are documented above. CARDIOVASCULAR: Well-healed right carotid incision.  No carotid bruits bilaterally.   PULMONARY: There is good air exchange  MUSCULOSKELETAL: There are no major deformities or cyanosis. NEUROLOGIC: No focal weakness or paresthesias are detected. SKIN: There are no ulcers or rashes noted. PSYCHIATRIC: The patient has a normal affect.  DATA:  Carotid duplex today reveals widely patent endarterectomy on the right.  He does have a calcified plaque of the left internal carotid bifurcation with 40 to 59% stenosis  MEDICAL ISSUES: Discussed this with patient.  I recommend that we see him again in 1 year with repeat carotid duplex surveillance.  He knows to report immediately should he have any neurologic changes    Larina Earthly, MD FACS [facility] Tel [phone]  Note: Portions of this report may have been transcribed using voice recognition software.  Every effort has been made to ensure accuracy; however, inadvertent computerized transcription errors may still  be present.

## 2022-11-17 ENCOUNTER — Encounter: Payer: Self-pay | Admitting: Physician Assistant

## 2022-11-17 ENCOUNTER — Other Ambulatory Visit: Payer: Self-pay

## 2022-11-17 ENCOUNTER — Inpatient Hospital Stay (HOSPITAL_BASED_OUTPATIENT_CLINIC_OR_DEPARTMENT_OTHER): Payer: Medicare Other | Admitting: Physician Assistant

## 2022-11-17 VITALS — Wt 238.0 lb

## 2022-11-17 DIAGNOSIS — D500 Iron deficiency anemia secondary to blood loss (chronic): Secondary | ICD-10-CM

## 2023-01-03 ENCOUNTER — Encounter: Payer: Self-pay | Admitting: Hematology

## 2023-01-18 ENCOUNTER — Ambulatory Visit (HOSPITAL_COMMUNITY)
Admission: RE | Admit: 2023-01-18 | Discharge: 2023-01-18 | Disposition: A | Discharge status: Home / Self Care | Payer: Medicare Other | Source: Ambulatory Visit | Attending: Physician Assistant | Admitting: Physician Assistant

## 2023-01-18 DIAGNOSIS — Z87891 Personal history of nicotine dependence: Secondary | ICD-10-CM | POA: Insufficient documentation

## 2023-01-18 DIAGNOSIS — Z122 Encounter for screening for malignant neoplasm of respiratory organs: Secondary | ICD-10-CM | POA: Diagnosis present

## 2023-01-23 NOTE — Progress Notes (Signed)
Patient notified of LDCT Lung Cancer Screening Results via mail with the recommendation to follow-up in 12 months. Patient's referring provider has been sent a copy of results. Results are as follows:  IMPRESSION: 1. Lung-RADS 1, negative. Continue annual screening with low-dose chest CT without contrast in 12 months. 2. There is aortic atherosclerosis, in addition to left main and three-vessel coronary artery disease. Status post median sternotomy for CABG including LIMA to the LAD. 3. Mild diffuse bronchial wall thickening with mild centrilobular and paraseptal emphysema; imaging findings suggestive of underlying COPD.   Aortic Atherosclerosis (ICD10-I70.0) and Emphysema (ICD10-J43.9).

## 2023-02-17 ENCOUNTER — Inpatient Hospital Stay: Payer: Medicare Other | Attending: Hematology

## 2023-02-17 DIAGNOSIS — D500 Iron deficiency anemia secondary to blood loss (chronic): Secondary | ICD-10-CM

## 2023-02-17 DIAGNOSIS — D509 Iron deficiency anemia, unspecified: Secondary | ICD-10-CM | POA: Insufficient documentation

## 2023-02-17 LAB — COMPREHENSIVE METABOLIC PANEL
ALT: 14 U/L (ref 0–44)
AST: 14 U/L — ABNORMAL LOW (ref 15–41)
Albumin: 3.3 g/dL — ABNORMAL LOW (ref 3.5–5.0)
Alkaline Phosphatase: 35 U/L — ABNORMAL LOW (ref 38–126)
Anion gap: 9 (ref 5–15)
BUN: 10 mg/dL (ref 8–23)
CO2: 22 mmol/L (ref 22–32)
Calcium: 8.3 mg/dL — ABNORMAL LOW (ref 8.9–10.3)
Chloride: 106 mmol/L (ref 98–111)
Creatinine, Ser: 1.05 mg/dL (ref 0.61–1.24)
GFR, Estimated: >60 mL/min (ref >60)
Glucose, Bld: 131 mg/dL — ABNORMAL HIGH (ref 70–99)
Potassium: 4 mmol/L (ref 3.5–5.1)
Sodium: 137 mmol/L (ref 135–145)
Total Bilirubin: 0.5 mg/dL (ref 0.3–1.2)
Total Protein: 6.4 g/dL — ABNORMAL LOW (ref 6.5–8.1)

## 2023-02-17 LAB — CBC WITH DIFFERENTIAL/PLATELET
Abs Immature Granulocytes: 0.01 10*3/uL (ref 0.00–0.07)
Basophils Absolute: 0 10*3/uL (ref 0.0–0.1)
Basophils Relative: 1 %
Eosinophils Absolute: 0.1 10*3/uL (ref 0.0–0.5)
Eosinophils Relative: 2 %
HCT: 35 % — ABNORMAL LOW (ref 39.0–52.0)
Hemoglobin: 11.6 g/dL — ABNORMAL LOW (ref 13.0–17.0)
Immature Granulocytes: 0 %
Lymphocytes Relative: 31 %
Lymphs Abs: 1.4 10*3/uL (ref 0.7–4.0)
MCH: 28.9 pg (ref 26.0–34.0)
MCHC: 33.1 g/dL (ref 30.0–36.0)
MCV: 87.3 fL (ref 80.0–100.0)
Monocytes Absolute: 0.2 10*3/uL (ref 0.1–1.0)
Monocytes Relative: 4 %
Neutro Abs: 2.7 10*3/uL (ref 1.7–7.7)
Neutrophils Relative %: 62 %
Platelets: 225 10*3/uL (ref 150–400)
RBC: 4.01 MIL/uL — ABNORMAL LOW (ref 4.22–5.81)
RDW: 14.1 % (ref 11.5–15.5)
WBC: 4.3 10*3/uL (ref 4.0–10.5)
nRBC: 0 % (ref 0.0–0.2)

## 2023-02-17 LAB — IRON AND TIBC
Iron: 44 ug/dL — ABNORMAL LOW (ref 45–182)
Saturation Ratios: 15 % — ABNORMAL LOW (ref 17.9–39.5)
TIBC: 289 ug/dL (ref 250–450)
UIBC: 245 ug/dL

## 2023-02-17 LAB — FERRITIN: Ferritin: 39 ng/mL (ref 24–336)

## 2023-02-24 ENCOUNTER — Inpatient Hospital Stay: Payer: Medicare Other | Attending: Hematology | Admitting: Oncology

## 2023-02-24 DIAGNOSIS — D500 Iron deficiency anemia secondary to blood loss (chronic): Secondary | ICD-10-CM

## 2023-02-24 DIAGNOSIS — Z87891 Personal history of nicotine dependence: Secondary | ICD-10-CM

## 2023-02-24 DIAGNOSIS — D509 Iron deficiency anemia, unspecified: Secondary | ICD-10-CM | POA: Insufficient documentation

## 2023-02-24 DIAGNOSIS — D72819 Decreased white blood cell count, unspecified: Secondary | ICD-10-CM | POA: Diagnosis not present

## 2023-02-24 NOTE — Progress Notes (Signed)
VIRTUAL VISIT via TELEPHONE NOTE [facility]   I connected with Kyle Daniel  on 02/24/23 at  10:29 AM by telephone and verified that I am speaking with the correct person using two identifiers.  Location: Patient: Home Provider: [facility]   I discussed the limitations, risks, security and privacy concerns of performing an evaluation and management service by telephone and the availability of in person appointments. I also discussed with the patient that there may be a patient responsible charge related to this service. The patient expressed understanding and agreed to proceed.  REASON FOR VISIT:  Follow-up for iron deficiency anemia   PRIOR THERAPY: PRBC transfusions   CURRENT THERAPY: Intermittent IV iron   INTERVAL HISTORY:  Kyle Daniel is contacted today for follow-up of  his iron deficiency anemia.  He was last evaluated by Rojelio Brenner PA-C on 11/17/2022.  Since his last visit, he has been doing fairly well.  Appetite is 100% and energy levels are 50%.  Has occasional dizziness and lightheadedness when he stands, headaches and numbness in his feet.  Has underlying chronic anxiety and depression although this is controlled.  Has an occasional cough and is easily fatigued.  He last received IV iron on 08/16/2022, 09/01/2022 and 09/09/2022.  Reports he did not notice a significant increase in his energy level after his IV iron earlier in the year.  He continues to take an iron supplement and tolerates this well.  Denies any melena, hematochezia, hematuria, epistasis or gum bleeding.  Reports his diet is fairly normal incorporating fruits, vegetables and meats.  He is taking iron and vitamin B12 daily.  Denies any constipation or GI upset.  He is followed by gastroenterology Troy Sine, NP and was last seen in clinic on 08/30/2022.   REVIEW OF SYSTEMS:   Review of Systems  Constitutional:  Positive for malaise/fatigue. Negative for chills, fever and  weight loss.  HENT:  Negative for congestion, ear pain and tinnitus.   Eyes: Negative.  Negative for blurred vision and double vision.  Respiratory:  Positive for cough. Negative for sputum production and shortness of breath.   Cardiovascular: Negative.  Negative for chest pain, palpitations and leg swelling.  Gastrointestinal: Negative.  Negative for abdominal pain, constipation, diarrhea, nausea and vomiting.  Genitourinary:  Negative for dysuria, frequency and urgency.  Musculoskeletal:  Negative for back pain and falls.  Skin: Negative.  Negative for rash.  Neurological:  Positive for tingling, sensory change and headaches. Negative for weakness.  Endo/Heme/Allergies: Negative.  Does not bruise/bleed easily.  Psychiatric/Behavioral:  Negative for depression. The patient is nervous/anxious. The patient does not have insomnia.      PHYSICAL EXAM: (per limitations of virtual telephone visit)  The patient is alert and oriented x 3, exhibiting adequate mentation, good mood, and ability to speak in full sentences and execute sound judgement.  ASSESSMENT & PLAN:  1.  Chronic iron deficiency anemia:  - History of GI bleed in October 2020 at [facility] in [place], required 5 units of PRBC. - Admitted to the hospital from 01/27/2020 through 01/29/2020 with dizziness and dyspnea on exertion. Found to have hemoglobin 5.6, received blood transfusions. - EGD on 01/28/2020 and colonoscopy on 01/29/2019 were nonrevealing. - Capsule study on 03/12/2020, stomach with several images of blood flecks or wisps of blood without obvious source.  Multiple images of duodenal, proximal jejunal areas with what appears lymphangiectasia's without active bleeding.  Further along in the study, images with possible  erosions/ulcers. - Other nutritional deficiency work-up was negative.  SPEP was negative. - Most recent Feraheme x 3 in January/February 2024 - Most recent labs from 02/17/2023 show iron saturation 15%, ferritin 39,  hemoglobin 11.6 with MCV 87.3.  Differential is normal. - Denies any recent GI bleed. - Symptomatic with mild to moderate baseline fatigue - He is taking B12 and iron supplements at home. - Labs from last year show evidence of mild CKD stage IIIa, which may also be contributing to his anemia. - PLAN:  -Recommend 2 additional doses of IV Feraheme. - We will repeat CBC/CMP/iron panel in 3 months followed by in person visit.  - Continue to follow with gastroenterologists.  - Patient aware of alarm symptoms that would prompt immediate medical attention at the ED.   2.  Leukopenia, mild - Mild leukopenia since June 2022.   -Labs from 02/17/2023 show white count of 4.3 and absolute neutrophil count of 2.0. - Nutritional panel (08/04/2022) normal folate, copper, B12, MMA.  CMP at baseline.  Normal LDH. - No frequent infections or B symptoms. - PLAN: We will continue surveillance with repeat CBC in 3 months   3.  Smoking history: - Reviewed results of CT chest from 05/26/2020 which was lung RADS 1. - CT chest/low-dose LCS (09/21/2021): Lung-RADS 1, negative -CT low-dose scan from 01/18/2023 was lung RADS 1 negative. -Continue annual low-dose CT scan. - PLAN: His next annual LDCT is in June 2025.      4.  Social/family history: - Active smoker for 56 years, 1.5 PPD current smoker - Sisters x 2 with breast cancer -- Reports [employer] exposure  PLAN SUMMARY:  >> Labs in 3 months (CBC/D, CMP, ferritin, iron/TIBC) and see APP/MD.  >> Recommend 2 doses of IV Feraheme over the next 1 to 2 weeks given one week apart.      I discussed the assessment and treatment plan with the patient. The patient was provided an opportunity to ask questions and all were answered. The patient agreed with the plan and demonstrated an understanding of the instructions.   The patient was advised to call back or seek an in-person evaluation if the symptoms worsen or if the condition fails to improve as  anticipated.  I provided 15 minutes of non face-to-face telephone visit time during this encounter, and > 50% was spent counseling as documented under my assessment & plan.

## 2023-02-24 NOTE — Addendum Note (Signed)
Addended by: Durenda Hurt E on: 02/24/2023 07:44 PM   Modules accepted: Level of Service

## 2023-02-27 NOTE — Progress Notes (Unsigned)
GI Office Note    Referring Provider: Pearson Grippe, MD Primary Care Physician:  Pearson Grippe, MD Primary Gastroenterologist: Hennie Duos. Marletta Lor, DO  Date:  02/28/2023  ID:  Kyle Daniel, DOB [birth year], MRN [id number]   Chief Complaint   Chief Complaint  Patient presents with   Follow-up    Follow up. No problems    History of Present Illness  Kyle Daniel is a 77 y.o. male with a history of IDA-transfusion dependent, CAD s/p CABG in 2004, BPH, diabetes, HLD, HTN, adenomatous colon polyps presenting today for follow up.   History of profound anemia secondary to GI bleeding.  Prior hospitalization hospital in October 2020 and had hemoglobin of 4.2.  He received 5 units of PRBCs.  Also hospitalized again in July 2021 with profound anemia, heme positive stool.  He underwent colonoscopy and EGD during admission as outlined below.  Pathology negative for H. pylori.  During this admission his hemoglobin was 5.6 and he received 3 units of blood.   EGD 01/28/20: -Non erosive reflux esophagitis -Subtly abnormal gastric mucosa s/p biopsy -Minimally eroded second portion of duodenum s/p biopsy -No obvious blood on exam   Colonoscopy 01/29/20: -Completely normal colon -Normal TI and rectum. -Advised outpatient video capsule. -Close follow-up of H&H.   Givens capsule 02/26/20: Completed but Did Not Leave the Stomach, Recommended Repeating Capsule with 10 Mg IV Reglan 30 Minutes Prior.   Givens capsule 03/12/20: Several images of blood Fixapost blood without obvious source in the stomach.  Multiple images of duodenal/proximal jejunal areas with what appeared to be lymphangiectasia's without active bleeding.  Images with possible erosions/ulcers further in the study.  Recommended strict NSAID avoidance.  Note to be sent to Dr. Wyline Mood to evaluate risk versus benefits of daily aspirin use.  Also given referral to hematology.   Patient has been following with hematology and receiving iron infusions.  Last  received iron infusions in January/February of this year.  Last office visit 08/30/22.  Due to have iron infusion coming up.  Reported intermittent dark/black stools but this does not happen very often.  Energy levels fair/low.  Denied any chest pain or shortness of breath.  Denied any BRBPR.  Denied pica but did report intermittent peripheral edema.  Continues on once daily iron therapy and B12.  Denies any NSAIDs.  Typical GERD controlled with omeprazole once daily.  Advised to continue daily PPI, iron, and B12 and continue to follow with hematology for intermittent iron.  If recurrent black/tarry stools or any bright red blood develops he is to notify immediately and we will consider repeat EGD.  Labs 02/17/2023: Iron sat 15%, ferritin 39, hemoglobin 11.6, MCV 87.3.  Last visit with hematology 02/24/23.  Patient reports significant improvement of energy after iron infusions earlier this year.  Tolerating daily iron therapy well.  He denies any GI complaints.  Provider noted that his labs indicate mild CKD stage IIIa which could be also contributing to his anemia.  Advised 2 additional doses of Feraheme and repeat labs in 3 months.  Today: GERD - Denies any N/V. Sometimes has some difficulties with solid foods but improves after drinking liquids. Happens about 50% of the time and depends on what he eats. Bread and solid meat like steak tend to get stuck the most. Feels like it may get stuck at the base of his neck. Reflux is pretty well controlled.   Anemia - Possibly sees a slight difference in energy after iron infusions. Fatigue is about the same.  Has some occasional dizziness or lightheadedness. Has seen dark stools. He's states on occasion he does see some black stool but not very often. No abdominal pain, brbpr.    Denies constipation or diarrhea.    Current Outpatient Medications  Medication Sig Dispense Refill   acetaminophen (TYLENOL) 325 MG tablet Take by mouth.     albuterol (VENTOLIN  HFA) 108 (90 Base) MCG/ACT inhaler Inhale 1-2 puffs into the lungs every 6 (six) hours as needed for wheezing or shortness of breath.     Alcohol Swabs (ALCOHOL WIPES) 70 % PADS Apply topically.     amitriptyline (ELAVIL) 50 MG tablet TAKE ONE TABLET BY MOUTH EVERY NIGHT FOR NERVE PAIN. NOTE INCREASED STRENGTH     amLODipine (NORVASC) 10 MG tablet Take 1 tablet (10 mg total) by mouth daily. 30 tablet 5   aspirin 81 MG tablet Take 1 tablet (81 mg total) by mouth daily with breakfast. 30 tablet 1   cholecalciferol (VITAMIN D3) 25 MCG (1000 UNIT) tablet Take 1,000 Units by mouth every morning.     dorzolamide-timolol (COSOPT) 22.3-6.8 MG/ML ophthalmic solution Place 1 drop into the right eye in the morning.     ferrous sulfate 325 (65 FE) MG tablet Take 325 mg by mouth daily with breakfast.     finasteride (PROSCAR) 5 MG tablet Take 5 mg by mouth at bedtime.     gabapentin (NEURONTIN) 100 MG capsule Take 200 mg by mouth 2 (two) times daily.      Insulin Glargine w/ Trans Port 100 UNIT/ML SOPN Inject 25 units twice a day by subcutaneous route.     insulin glargine-yfgn (SEMGLEE) 100 UNIT/ML injection      metFORMIN (GLUCOPHAGE-XR) 500 MG 24 hr tablet Take 1 tablet by mouth 2 (two) times daily.     nitroGLYCERIN (NITROSTAT) 0.4 MG SL tablet Place 1 tablet (0.4 mg total) under the tongue every 5 (five) minutes as needed for chest pain. 25 tablet 3   Omega-3 Fatty Acids (FISH OIL) 1000 MG CAPS Take 3 capsules by mouth 2 (two) times daily before a meal.     omeprazole (PRILOSEC) 20 MG capsule Take 1 capsule (20 mg total) by mouth daily. 90 capsule 3   pioglitazone (ACTOS) 45 MG tablet TAKE ONE TABLET BY MOUTH EVERY DAY FOR DIABETES MELLITUS     pravastatin (PRAVACHOL) 80 MG tablet Take 1 tablet (80 mg total) by mouth daily. 90 tablet 1   prazosin (MINIPRESS) 2 MG capsule Take 2 mg by mouth at bedtime.     traZODone (DESYREL) 50 MG tablet Take 50 mg by mouth at bedtime.     losartan (COZAAR) 25 MG tablet  Take 1 tablet (25 mg total) by mouth daily. (Patient not taking: Reported on 02/28/2023) 90 tablet 2   oxyCODONE-acetaminophen (PERCOCET) 5-325 MG tablet Take 1 tablet by mouth every 6 (six) hours as needed for severe pain. (Patient not taking: Reported on 02/28/2023) 8 tablet 0   torsemide (DEMADEX) 20 MG tablet Take 1 tablet by mouth daily. (Patient not taking: Reported on 02/28/2023)     No current facility-administered medications for this visit.    Past Medical History:  Diagnosis Date   Anemia    ASCVD (arteriosclerotic cardiovascular disease)    S/P CABG surgery  in 2004; cardiac cath in 2005 revealed patent grafts.   BPH (benign prostatic hypertrophy)    Carotid artery occlusion    Cerebrovascular disease    bilateral bruits   Coronary artery disease  DM (diabetes mellitus) (HCC)    AODM-- insulin requiring for the past 42yrs   Hx of adenomatous colonic polyps    Hyperlipidemia    Hypertension    IDA (iron deficiency anemia)    Pneumonia 2020   Tobacco abuse    50 pack years; current 1 pack per day    Past Surgical History:  Procedure Laterality Date   BIOPSY  01/28/2020   Procedure: BIOPSY;  Surgeon: Corbin Ade, MD;  Location: [facility];  Service: Endoscopy;;   CERVICAL DISCECTOMY  1996   anterior cervical spine discectomy and fusion   CHOLECYSTECTOMY  06/17/2003   by Lebron Conners MD   COLONOSCOPY W/ POLYPECTOMY     4 adenomatous polyps   COLONOSCOPY WITH PROPOFOL N/A 01/29/2020   normal colon, normal appearing TI.    CORONARY ARTERY BYPASS GRAFT     ENDARTERECTOMY Right 12/08/2020   Procedure: RIGHT CAROTID ENDARTERECTOMY;  Surgeon: Larina Earthly, MD;  Location: [facility] OR;  Service: Vascular;  Laterality: Right;   ESOPHAGOGASTRODUODENOSCOPY  07/04/2007   Dr. Darrick Penna; normal esophagus, erythema/edema in gastric antrum s/p biopsied, duodenitis.  Pathology positive for H. pylori.   ESOPHAGOGASTRODUODENOSCOPY (EGD) WITH PROPOFOL N/A 01/28/2020    mild erosive  reflux esophagitis, s/p biopsy of gastric mucosa, normal duodenum overall except for minimally eroded second portion of mucosa s/p biopsy. Pathology: slight chronic gastritis, negative H.pylori, benign duodenal mucosa, no sprue.    GIVENS CAPSULE STUDY N/A 02/26/2020   Procedure: GIVENS CAPSULE STUDY;  Surgeon: Lanelle Bal, DO;  Location: [facility];  Service: Endoscopy;  Laterality: N/A;  7:30am   GIVENS CAPSULE STUDY N/A 03/12/2020   Surgeon: Corbin Ade, MD;  blood flecks/wisp of blood in the stomach without obvious source, possible erosions/ulcers in the small bowel, but no active bleeding.   PATCH ANGIOPLASTY Right 12/08/2020   Procedure: PATCH ANGIOPLASTY HEMASHIELD TO RIGHT INTERNAL CAROTID ARTERY;  Surgeon: Larina Earthly, MD;  Location: [facility] OR;  Service: Vascular;  Laterality: Right;    Family History  Problem Relation Age of Onset   Pneumonia Mother 33       complications of diabetes   Diabetes Mother    Pneumonia Father 57       complication of pneumonia   Cancer Sister        breast   Cancer Sister        breast cancer   Colon cancer Neg Hx     Allergies as of 02/28/2023 - Review Complete 02/28/2023  Allergen Reaction Noted   Lisinopril Cough 03/20/2007    Social History   Socioeconomic History   Marital status: Married    Spouse name: valeria   Number of children: 3   Years of education: Not on file   Highest education level: Not on file  Occupational History   Occupation: disabled  Tobacco Use   Smoking status: Every Day    Current packs/day: 1.50    Average packs/day: 1.5 packs/day for 62.5 years (93.7 ttl pk-yrs)    Types: Cigarettes    Start date: 09/05/1960    Passive exposure: Current   Smokeless tobacco: Never  Vaping Use   Vaping status: Never Used  Substance and Sexual Activity   Alcohol use: No    Comment: quit 1980's   Drug use: No   Sexual activity: Not on file  Other Topics Concern   Not on file  Social History Narrative    Not on file   Social  Determinants of Health   Financial Resource Strain: Low Risk  (09/28/2020)   Overall Financial Resource Strain (CARDIA)    Difficulty of Paying Living Expenses: Not hard at all  Food Insecurity: No Food Insecurity (09/28/2020)   Hunger Vital Sign    Worried About Running Out of Food in the Last Year: Never true    Ran Out of Food in the Last Year: Never true  Transportation Needs: No Transportation Needs (09/28/2020)   PRAPARE - Administrator, Civil Service (Medical): No    Lack of Transportation (Non-Medical): No  Physical Activity: Inactive (09/28/2020)   Exercise Vital Sign    Days of Exercise per Week: 0 days    Minutes of Exercise per Session: 0 min  Stress: No Stress Concern Present (09/28/2020)   [employer] of Occupational Health - Occupational Stress Questionnaire    Feeling of Stress : Only a little  Social Connections: Socially Integrated (09/28/2020)   Social Connection and Isolation Panel [NHANES]    Frequency of Communication with Friends and Family: More than three times a week    Frequency of Social Gatherings with Friends and Family: More than three times a week    Attends Religious Services: 1 to 4 times per year    Active Member of [employer] or Organizations: Yes    Attends Engineer, structural: More than 4 times per year    Marital Status: Married     Review of Systems   Gen: Denies fever, chills, anorexia. Denies fatigue, weakness, weight loss.  CV: Denies chest pain, palpitations, syncope, peripheral edema, and claudication. Resp: Denies dyspnea at rest, cough, wheezing, coughing up blood, and pleurisy. GI: See HPI Derm: Denies rash, itching, dry skin Psych: Denies depression, anxiety, memory loss, confusion. No homicidal or suicidal ideation.  Heme: Denies bruising, bleeding, and enlarged lymph nodes.   Physical Exam   BP (!) 95/58 (BP Location: Right Arm, Patient Position: Sitting, Cuff Size: Large)   Pulse 93    Temp 97.7 F (36.5 C) (Temporal)   Ht 6\' 1"  (1.854 m)   Wt 233 lb 3.2 oz (105.8 kg)   SpO2 96%   BMI 30.77 kg/m   General:   Alert and oriented. No distress noted. Pleasant and cooperative.  Head:  Normocephalic and atraumatic. Eyes:  Conjuctiva clear without scleral icterus. Mouth:  Oral mucosa pink and moist. Good dentition. No lesions. Lungs:  Clear to auscultation bilaterally. No wheezes, rales, or rhonchi. No distress.  Heart:  S1, S2 present without murmurs appreciated.  Abdomen:  +BS, soft, non-tender and non-distended. No rebound or guarding. No HSM or masses noted. Rectal: deferred Msk:  Symmetrical without gross deformities. Normal posture. Extremities:  Without edema. Neurologic:  Alert and  oriented x4 Psych:  Alert and cooperative. Normal mood and affect.   Assessment  Kyle Daniel is a 77 y.o. male with a history of CAD s/p CABG injection 4, BPH, IDA-infusion dependent, diabetes, HLD, HTN, and adenomatous colon polyps presenting today for follow-up.  IDA: History of multiple hospitalizations in 2020/2021 with need for transfusions in the setting of GI bleed.  He underwent EGD, colonoscopy, and capsule study as noted in HPI.  Possible mild anemia from limited to ectasias and erosion/ulcers found within the small bowel on capsule study.  Is on chronic aspirin therapy given his cardiac history.  He has been following with hematology since 2021 and receiving intermittent iron infusions.  His IDA is likely multifactorial in the setting of CKD as  well as possible mild oozing from erosions.  Has had 3 iron infusions earlier this year and is due to have 2 more upcoming.  Fatigue remains about the same.  Most recent hemoglobin stable at 11.6 with borderline iron saturation and ferritin.  He will continue on oral iron therapy.  Has reported some darker stools recently, unclear if this is actual melena.  Advised to monitor if he has any signs of any overt black/tarry stools to let me know  immediately.  GERD: Controlled on once daily omeprazole 20 mg.  Dysphagia: Reports about 50% of the time he has issues with solid foods.  He states foods do not get stuck but feels like they may get stuck, primarily with breads and meats.  He states he needs to drink extra liquids to get this to go down. Not interested in EGD at this time. Advised if symptoms become more frequent to let me know and we can consider EGD at that time.  PLAN   Continue omeprazole 20 mg once daily Continue daily iron and B12 Continue to follow with hematology for IV iron infusions as needed. Continue to monitor for melena and rectal bleeding Monitor for worsening dysphagia.  Chewing precautions discussed. Follow-up in 6 months.     Brooke Bonito, MSN, FNP-BC, AGACNP-BC [facility]

## 2023-02-28 ENCOUNTER — Ambulatory Visit (INDEPENDENT_AMBULATORY_CARE_PROVIDER_SITE_OTHER): Payer: Medicare Other | Admitting: Gastroenterology

## 2023-02-28 ENCOUNTER — Encounter: Payer: Self-pay | Admitting: Gastroenterology

## 2023-02-28 VITALS — BP 95/58 | HR 93 | Temp 97.7°F | Ht 73.0 in | Wt 233.2 lb

## 2023-02-28 DIAGNOSIS — K2100 Gastro-esophageal reflux disease with esophagitis, without bleeding: Secondary | ICD-10-CM

## 2023-02-28 DIAGNOSIS — D509 Iron deficiency anemia, unspecified: Secondary | ICD-10-CM | POA: Diagnosis not present

## 2023-02-28 DIAGNOSIS — R1310 Dysphagia, unspecified: Secondary | ICD-10-CM | POA: Diagnosis not present

## 2023-02-28 NOTE — Patient Instructions (Addendum)
Continue omeprazole 20 mg once daily.  Continue your iron as well as your B12.  You should be fine to take your over-the-counter fish oil as well.  Continue to follow with hematology for intermittent iron infusions.  Please let me know if you experience any increased frequency of black/tarry stools or worsening trouble swallowing/feeling like food is getting stuck in your throat.  When eating bread and meats, please cut them up and smaller bites, chew longer, and alternate with sips of liquids.  If any of the symptoms increase we will need to consider performing another upper endoscopy.  Follow-up in 6 months, sooner if needed  It was a pleasure to see you today. I want to create trusting relationships with patients. If you receive a survey regarding your visit,  I greatly appreciate you taking time to fill this out on paper or through your MyChart. I value your feedback.  Brooke Bonito, MSN, FNP-BC, AGACNP-BC [facility]

## 2023-03-02 ENCOUNTER — Inpatient Hospital Stay: Payer: Medicare Other

## 2023-03-02 VITALS — BP 118/62 | HR 72 | Temp 98.2°F | Resp 18

## 2023-03-02 DIAGNOSIS — D509 Iron deficiency anemia, unspecified: Secondary | ICD-10-CM | POA: Diagnosis present

## 2023-03-02 DIAGNOSIS — D500 Iron deficiency anemia secondary to blood loss (chronic): Secondary | ICD-10-CM

## 2023-03-02 DIAGNOSIS — K922 Gastrointestinal hemorrhage, unspecified: Secondary | ICD-10-CM

## 2023-03-02 MED ORDER — CETIRIZINE HCL 10 MG PO TABS
10.0000 mg | ORAL_TABLET | Freq: Once | ORAL | Status: AC
  Administered 2023-03-02: 10 mg via ORAL
  Filled 2023-03-02: qty 1

## 2023-03-02 MED ORDER — SODIUM CHLORIDE 0.9 % IV SOLN: Freq: Once | INTRAVENOUS | Status: AC

## 2023-03-02 MED ORDER — SODIUM CHLORIDE 0.9 % IV SOLN
510.0000 mg | Freq: Once | INTRAVENOUS | Status: AC
  Administered 2023-03-02: 510 mg via INTRAVENOUS
  Filled 2023-03-02: qty 17

## 2023-03-02 MED ORDER — ACETAMINOPHEN 325 MG PO TABS
650.0000 mg | ORAL_TABLET | Freq: Once | ORAL | Status: AC
  Administered 2023-03-02: 650 mg via ORAL
  Filled 2023-03-02: qty 2

## 2023-03-02 NOTE — Progress Notes (Signed)
Patient declined 30 minute post iron wait vss upon discharge. Ambulatory to lobby

## 2023-03-03 ENCOUNTER — Inpatient Hospital Stay: Payer: Medicare Other

## 2023-03-09 MED FILL — Ferumoxytol Inj 510 MG/17ML (30 MG/ML) (Elemental Fe): INTRAVENOUS | Qty: 17 | Status: AC

## 2023-03-10 ENCOUNTER — Inpatient Hospital Stay: Payer: Medicare Other

## 2023-03-24 ENCOUNTER — Inpatient Hospital Stay: Payer: Medicare Other

## 2023-03-24 VITALS — BP 120/58 | HR 70 | Temp 97.8°F | Resp 18

## 2023-03-24 DIAGNOSIS — D509 Iron deficiency anemia, unspecified: Secondary | ICD-10-CM | POA: Diagnosis not present

## 2023-03-24 DIAGNOSIS — D500 Iron deficiency anemia secondary to blood loss (chronic): Secondary | ICD-10-CM

## 2023-03-24 DIAGNOSIS — K922 Gastrointestinal hemorrhage, unspecified: Secondary | ICD-10-CM

## 2023-03-24 MED ORDER — CETIRIZINE HCL 10 MG PO TABS
10.0000 mg | ORAL_TABLET | Freq: Once | ORAL | Status: AC
  Administered 2023-03-24: 10 mg via ORAL
  Filled 2023-03-24: qty 1

## 2023-03-24 MED ORDER — ACETAMINOPHEN 325 MG PO TABS
650.0000 mg | ORAL_TABLET | Freq: Once | ORAL | Status: AC
  Administered 2023-03-24: 650 mg via ORAL
  Filled 2023-03-24: qty 2

## 2023-03-24 MED ORDER — SODIUM CHLORIDE 0.9 % IV SOLN: Freq: Once | INTRAVENOUS | Status: AC

## 2023-03-24 MED ORDER — SODIUM CHLORIDE 0.9 % IV SOLN
510.0000 mg | Freq: Once | INTRAVENOUS | Status: AC
  Administered 2023-03-24: 510 mg via INTRAVENOUS
  Filled 2023-03-24: qty 510

## 2023-03-24 NOTE — Progress Notes (Signed)
Patient presents today for Feraheme infusion per providers order.  Vital signs WNL.  Patient has no new complaints at this time.  Peripheral Iv started and blood return noted pre and post infusion.  Stable during infusion without adverse affects.  Vital signs stable.  No complaints at this time.  Discharge from clinic ambulatory in stable condition.  Alert and oriented X 3.  Follow up with [facility] as scheduled.

## 2023-03-24 NOTE — Patient Instructions (Signed)
[facility]  Discharge Instructions: Thank you for choosing [facility] to provide your oncology and hematology care.  If you have a lab appointment with the Cancer Center - please note that after April 8th, 2024, all labs will be drawn in the cancer center.  You do not have to check in or register with the main entrance as you have in the past but will complete your check-in in the cancer center.  Wear comfortable clothing and clothing appropriate for easy access to any Portacath or PICC line.   We strive to give you quality time with your provider. You may need to reschedule your appointment if you arrive late (15 or more minutes).  Arriving late affects you and other patients whose appointments are after yours.  Also, if you miss three or more appointments without notifying the office, you may be dismissed from the clinic at the provider's discretion.      For prescription refill requests, have your pharmacy contact our office and allow 72 hours for refills to be completed.    Today you received the following chemotherapy and/or immunotherapy agents Feraheme      To help prevent nausea and vomiting after your treatment, we encourage you to take your nausea medication as directed.  BELOW ARE SYMPTOMS THAT SHOULD BE REPORTED IMMEDIATELY: *FEVER GREATER THAN 100.4 F (38 C) OR HIGHER *CHILLS OR SWEATING *NAUSEA AND VOMITING THAT IS NOT CONTROLLED WITH YOUR NAUSEA MEDICATION *UNUSUAL SHORTNESS OF BREATH *UNUSUAL BRUISING OR BLEEDING *URINARY PROBLEMS (pain or burning when urinating, or frequent urination) *BOWEL PROBLEMS (unusual diarrhea, constipation, pain near the anus) TENDERNESS IN MOUTH AND THROAT WITH OR WITHOUT PRESENCE OF ULCERS (sore throat, sores in mouth, or a toothache) UNUSUAL RASH, SWELLING OR PAIN  UNUSUAL VAGINAL DISCHARGE OR ITCHING   Items with * indicate a potential emergency and should be followed up as soon as possible or go to the  Emergency Department if any problems should occur.  Please show the CHEMOTHERAPY ALERT CARD or IMMUNOTHERAPY ALERT CARD at check-in to the Emergency Department and triage nurse.  Should you have questions after your visit or need to cancel or reschedule your appointment, please contact [facility] [phone]  and follow the prompts.  Office hours are 8:00 a.m. to 4:30 p.m. Monday - Friday. Please note that voicemails left after 4:00 p.m. may not be returned until the following business day.  We are closed weekends and major holidays. You have access to a nurse at all times for urgent questions. Please call the main number to the clinic [phone] and follow the prompts.  For any non-urgent questions, you may also contact your provider using MyChart. We now offer e-Visits for anyone 18 and older to request care online for non-urgent symptoms. For details visit [email or web address].   Also download the MyChart app! Go to the app store, search "MyChart", open the app, select [facility], and log in with your MyChart username and password.   

## 2023-04-20 NOTE — Telephone Encounter (Signed)
A user error has taken place: encounter opened in error, closed for administrative reasons.

## 2023-06-01 ENCOUNTER — Other Ambulatory Visit: Payer: Self-pay

## 2023-06-01 DIAGNOSIS — D500 Iron deficiency anemia secondary to blood loss (chronic): Secondary | ICD-10-CM

## 2023-06-02 ENCOUNTER — Inpatient Hospital Stay: Payer: Medicare Other

## 2023-06-06 ENCOUNTER — Inpatient Hospital Stay: Payer: Medicare Other | Attending: Hematology

## 2023-06-06 ENCOUNTER — Encounter: Payer: Self-pay | Admitting: Hematology

## 2023-06-06 DIAGNOSIS — D500 Iron deficiency anemia secondary to blood loss (chronic): Secondary | ICD-10-CM | POA: Insufficient documentation

## 2023-06-06 LAB — CBC WITH DIFFERENTIAL/PLATELET
Abs Immature Granulocytes: 0.01 10*3/uL (ref 0.00–0.07)
Basophils Absolute: 0 10*3/uL (ref 0.0–0.1)
Basophils Relative: 0 %
Eosinophils Absolute: 0.1 10*3/uL (ref 0.0–0.5)
Eosinophils Relative: 1 %
HCT: 35.7 % — ABNORMAL LOW (ref 39.0–52.0)
Hemoglobin: 11.7 g/dL — ABNORMAL LOW (ref 13.0–17.0)
Immature Granulocytes: 0 %
Lymphocytes Relative: 29 %
Lymphs Abs: 1.4 10*3/uL (ref 0.7–4.0)
MCH: 28.3 pg (ref 26.0–34.0)
MCHC: 32.8 g/dL (ref 30.0–36.0)
MCV: 86.2 fL (ref 80.0–100.0)
Monocytes Absolute: 0.2 10*3/uL (ref 0.1–1.0)
Monocytes Relative: 5 %
Neutro Abs: 3.1 10*3/uL (ref 1.7–7.7)
Neutrophils Relative %: 65 %
Platelets: 195 10*3/uL (ref 150–400)
RBC: 4.14 MIL/uL — ABNORMAL LOW (ref 4.22–5.81)
RDW: 14.5 % (ref 11.5–15.5)
WBC: 4.9 10*3/uL (ref 4.0–10.5)
nRBC: 0 % (ref 0.0–0.2)

## 2023-06-06 LAB — COMPREHENSIVE METABOLIC PANEL
ALT: 13 U/L (ref 0–44)
AST: 13 U/L — ABNORMAL LOW (ref 15–41)
Albumin: 3.3 g/dL — ABNORMAL LOW (ref 3.5–5.0)
Alkaline Phosphatase: 35 U/L — ABNORMAL LOW (ref 38–126)
Anion gap: 6 (ref 5–15)
BUN: 9 mg/dL (ref 8–23)
CO2: 24 mmol/L (ref 22–32)
Calcium: 8.6 mg/dL — ABNORMAL LOW (ref 8.9–10.3)
Chloride: 107 mmol/L (ref 98–111)
Creatinine, Ser: 1.02 mg/dL (ref 0.61–1.24)
GFR, Estimated: >60 mL/min (ref >60)
Glucose, Bld: 138 mg/dL — ABNORMAL HIGH (ref 70–99)
Potassium: 3.8 mmol/L (ref 3.5–5.1)
Sodium: 137 mmol/L (ref 135–145)
Total Bilirubin: 0.3 mg/dL (ref <1.2)
Total Protein: 6.3 g/dL — ABNORMAL LOW (ref 6.5–8.1)

## 2023-06-06 LAB — IRON AND TIBC
Iron: 37 ug/dL — ABNORMAL LOW (ref 45–182)
Saturation Ratios: 14 % — ABNORMAL LOW (ref 17.9–39.5)
TIBC: 262 ug/dL (ref 250–450)
UIBC: 225 ug/dL

## 2023-06-06 LAB — FERRITIN: Ferritin: 75 ng/mL (ref 24–336)

## 2023-06-09 ENCOUNTER — Inpatient Hospital Stay (HOSPITAL_BASED_OUTPATIENT_CLINIC_OR_DEPARTMENT_OTHER): Payer: Medicare Other | Admitting: Oncology

## 2023-06-09 DIAGNOSIS — D500 Iron deficiency anemia secondary to blood loss (chronic): Secondary | ICD-10-CM | POA: Diagnosis not present

## 2023-06-09 NOTE — Progress Notes (Signed)
Virtual Visit via Telephone Note  I connected with Kyle Daniel on 06/09/23 at  2:30 PM EST by telephone and verified that I am speaking with the correct person using two identifiers.  Location: Patient: Home  Provider: Clinic   I discussed the limitations, risks, security and privacy concerns of performing an evaluation and management service by telephone and the availability of in person appointments. I also discussed with the patient that there may be a patient responsible charge related to this service. The patient expressed understanding and agreed to proceed.   History of Present Illness: Kyle Daniel is a 77 year old male with past medical history significant for iron deficiency anemia who is followed by [place] for intermittent IV iron infusions.  He was last seen in clinic by me on 02/24/23.  He last received IV Feraheme on 03/02/2023 and 03/24/2023.  Reports great tolerance.  He did not notice any significant difference after IV iron.  Reports appetite 100% energy levels of 80%.  Reports pain in his left leg rates it 5 out of 10.  Has intermittent cough and chest pain.  Has trouble starting urinary stream and occasional dizziness and headache.   He continues to take an iron supplement and tolerates this well.  Denies any melena, hematochezia, hematuria, epistasis or gum bleeding.  Reports his diet is fairly normal incorporating fruits, vegetables and meats.  He is taking iron and vitamin B12 daily.  Denies any constipation or GI upset.   He is followed by gastroenterology Troy Sine, NP and was last seen in clinic on 02/28/2023.  Observations/Objective: Review of Systems  Respiratory:  Positive for cough.   Cardiovascular:  Positive for chest pain.  Genitourinary:        Trouble starting stream  Musculoskeletal:  Positive for joint pain (Left leg).  Neurological:  Positive for dizziness, tingling and headaches.   Physical Exam Neurological:     Mental Status: He is alert and oriented to  person, place, and time.    Assessment and Plan: 1. Iron deficiency anemia due to chronic blood loss -Receives intermittent IV iron infusions. -Last iron infusion was given on 03/02/2023 and 03/24/2023 with great tolerance. -Denies any GI bleeding. -He continues oral B12 and iron supplements at home. -Labs from 06/06/2023 show hemoglobin of 11.7 (11.6) with otherwise normal differential.  CMP is stable.  Ferritin is 75 (39), total iron 37 and iron saturation 14%.  TIBC 262. -No additional IV iron needed at this time.   Follow Up Instructions: PLAN SUMMARY: >> No additional IV iron needed at this time. >> Continue oral B12 and iron supplements. >> Return to clinic in 3 months with labs a few days before and office visit.        I discussed the assessment and treatment plan with the patient. The patient was provided an opportunity to ask questions and all were answered. The patient agreed with the plan and demonstrated an understanding of the instructions.   The patient was advised to call back or seek an in-person evaluation if the symptoms worsen or if the condition fails to improve as anticipated.  I provided 20 minutes of non-face-to-face time during this encounter.   Mauro Kaufmann, NP

## 2023-08-10 ENCOUNTER — Encounter: Payer: Self-pay | Admitting: Gastroenterology

## 2023-09-14 ENCOUNTER — Other Ambulatory Visit: Payer: Self-pay

## 2023-09-14 DIAGNOSIS — D500 Iron deficiency anemia secondary to blood loss (chronic): Secondary | ICD-10-CM

## 2023-09-14 DIAGNOSIS — D708 Other neutropenia: Secondary | ICD-10-CM

## 2023-09-15 ENCOUNTER — Inpatient Hospital Stay: Payer: Medicare Other

## 2023-09-19 ENCOUNTER — Inpatient Hospital Stay: Payer: Medicare Other | Attending: Hematology

## 2023-09-19 DIAGNOSIS — D500 Iron deficiency anemia secondary to blood loss (chronic): Secondary | ICD-10-CM | POA: Diagnosis present

## 2023-09-19 DIAGNOSIS — D708 Other neutropenia: Secondary | ICD-10-CM

## 2023-09-19 LAB — CBC WITH DIFFERENTIAL/PLATELET
Abs Immature Granulocytes: 0.01 10*3/uL (ref 0.00–0.07)
Basophils Absolute: 0 10*3/uL (ref 0.0–0.1)
Basophils Relative: 0 %
Eosinophils Absolute: 0.1 10*3/uL (ref 0.0–0.5)
Eosinophils Relative: 2 %
HCT: 34.9 % — ABNORMAL LOW (ref 39.0–52.0)
Hemoglobin: 11.5 g/dL — ABNORMAL LOW (ref 13.0–17.0)
Immature Granulocytes: 0 %
Lymphocytes Relative: 35 %
Lymphs Abs: 1.2 10*3/uL (ref 0.7–4.0)
MCH: 28.4 pg (ref 26.0–34.0)
MCHC: 33 g/dL (ref 30.0–36.0)
MCV: 86.2 fL (ref 80.0–100.0)
Monocytes Absolute: 0.2 10*3/uL (ref 0.1–1.0)
Monocytes Relative: 7 %
Neutro Abs: 1.9 10*3/uL (ref 1.7–7.7)
Neutrophils Relative %: 56 %
Platelets: 190 10*3/uL (ref 150–400)
RBC: 4.05 MIL/uL — ABNORMAL LOW (ref 4.22–5.81)
RDW: 13.6 % (ref 11.5–15.5)
WBC: 3.5 10*3/uL — ABNORMAL LOW (ref 4.0–10.5)
nRBC: 0 % (ref 0.0–0.2)

## 2023-09-19 LAB — COMPREHENSIVE METABOLIC PANEL
ALT: 16 U/L (ref 0–44)
AST: 15 U/L (ref 15–41)
Albumin: 3.3 g/dL — ABNORMAL LOW (ref 3.5–5.0)
Alkaline Phosphatase: 39 U/L (ref 38–126)
Anion gap: 7 (ref 5–15)
BUN: 13 mg/dL (ref 8–23)
CO2: 23 mmol/L (ref 22–32)
Calcium: 8.4 mg/dL — ABNORMAL LOW (ref 8.9–10.3)
Chloride: 106 mmol/L (ref 98–111)
Creatinine, Ser: 1.12 mg/dL (ref 0.61–1.24)
GFR, Estimated: >60 mL/min (ref >60)
Glucose, Bld: 194 mg/dL — ABNORMAL HIGH (ref 70–99)
Potassium: 3.9 mmol/L (ref 3.5–5.1)
Sodium: 136 mmol/L (ref 135–145)
Total Bilirubin: 0.3 mg/dL (ref 0.0–1.2)
Total Protein: 6.3 g/dL — ABNORMAL LOW (ref 6.5–8.1)

## 2023-09-19 LAB — IRON AND TIBC
Iron: 75 ug/dL (ref 45–182)
Saturation Ratios: 26 % (ref 17.9–39.5)
TIBC: 294 ug/dL (ref 250–450)
UIBC: 219 ug/dL

## 2023-09-19 LAB — FERRITIN: Ferritin: 32 ng/mL (ref 24–336)

## 2023-09-22 ENCOUNTER — Inpatient Hospital Stay: Payer: Medicare Other | Admitting: Oncology

## 2023-09-22 VITALS — BP 122/72 | HR 76 | Temp 98.3°F | Resp 18 | Ht 73.0 in | Wt 227.7 lb

## 2023-09-22 DIAGNOSIS — D708 Other neutropenia: Secondary | ICD-10-CM

## 2023-09-22 DIAGNOSIS — D500 Iron deficiency anemia secondary to blood loss (chronic): Secondary | ICD-10-CM

## 2023-09-22 NOTE — Progress Notes (Signed)
 [place] [facility]   History of Present Illness: [name] is a 78 year old male with past medical history significant for iron deficiency anemia who is followed by [place] for intermittent IV iron infusions.  He was last seen in clinic by me on 06/09/23.  He last received IV Feraheme on 03/02/2023 and 03/24/2023.  Reports great tolerance.  He did not notice any significant difference after IV iron.  Reports appetite 90% energy levels of 40%.  Reports pain in his left leg rates it 7 out of 10.  Has trouble starting urinary stream and occasional dizziness and headache.  Has chronic but stable anxiety and depression.   He continues to take an iron supplement and tolerates this well.  Denies any melena, hematochezia, hematuria, epistasis or gum bleeding.  Reports his diet is fairly normal incorporating fruits, vegetables and meats.  He is taking iron and vitamin B12 daily.  Denies any constipation or GI upset.   He is followed by gastroenterology Troy Sine, NP and was last seen in clinic on 02/28/2023.  Observations/Objective: Review of Systems  Respiratory:  Positive for cough.   Cardiovascular:  Positive for chest pain.  Genitourinary:        Trouble starting stream  Musculoskeletal:  Positive for back pain, joint pain (Left leg) and neck pain.  Neurological:  Positive for dizziness, tingling and headaches.  Psychiatric/Behavioral:  Positive for depression. The patient is nervous/anxious.    Physical Exam Constitutional:      Appearance: Normal appearance.  HENT:     Head: Normocephalic and atraumatic.  Eyes:     Pupils: Pupils are equal, round, and reactive to light.  Cardiovascular:     Rate and Rhythm: Normal rate and regular rhythm.     Heart sounds: Normal heart sounds. No murmur heard. Pulmonary:     Effort: Pulmonary effort is normal.     Breath sounds: Normal breath sounds. No wheezing.  Abdominal:     General: Bowel sounds are normal. There is no distension.      Palpations: Abdomen is soft.     Tenderness: There is no abdominal tenderness.  Musculoskeletal:        General: Normal range of motion.     Cervical back: Normal range of motion.  Skin:    General: Skin is warm and dry.     Findings: No rash.  Neurological:     Mental Status: He is alert and oriented to person, place, and time.  Psychiatric:        Judgment: Judgment normal.    Assessment and Plan: 1. Iron deficiency anemia due to chronic blood loss -Receives intermittent IV iron infusions. -Last iron infusion was given on 03/02/2023 and 03/24/2023 with great tolerance. -Denies any GI bleeding. -He continues oral B12 and iron supplements at home. -Labs from 09/19/2023 show hemoglobin of 11.5 (11.7), MCV 86.2, otherwise unremarkable differential.  Anemia labs show iron saturation 26% with a TIBC of 294.  Ferritin 32 (75). -Recommend 2 additional doses of IV Feraheme.  We discussed in detail, and patient would like to try and increase his oral iron tablets to twice a day with orange juice to see if that improved his levels.  He was in agreement for IV iron should his levels drop in 3 months.  We discussed in detail foods that are rich in iron.  Continue B12 supplements.   Follow Up Instructions: PLAN SUMMARY: >> Increase iron tablets from once a day to twice per day.  Take with orange juice. >> Continue oral B12 meds. >> Return to clinic in 3 months with labs a few days before and telephone visit.     I discussed the assessment and treatment plan with the patient. The patient was provided an opportunity to ask questions and all were answered. The patient agreed with the plan and demonstrated an understanding of the instructions.   The patient was advised to call back or seek an in-person evaluation if the symptoms worsen or if the condition fails to improve as anticipated.  I spent 25 minutes dedicated to the care of this patient (face-to-face and non-face-to-face) on the date of  the encounter to include what is described in the assessment and plan.  Durenda Hurt, NP 09/22/2023 11:49 AM

## 2023-09-29 ENCOUNTER — Ambulatory Visit: Payer: Medicare Other | Attending: Cardiology | Admitting: Cardiology

## 2023-09-29 NOTE — Progress Notes (Deleted)
 Clinical Summary Kyle Daniel is a 78 y.o.maleseen today for follow up of the following medical problems.    1. CAD - history of CABG - underwent three-vessel coronary artery bypass graft surgery on 12/18/2002 with a LIMA to the LAD, left radial to the ramus intermedius, and SVG to circumflex marginal Kyle Daniel.   2020 nuclear stress: mild ischemia     - no chest pains, no SOB/DOE - compliant with meds       2. Palpitations -previous high caffeine intake.  - rare symptoms     3. HTN - he is compliant with meds       4. Hyperlipidemia - has been on pravastatin 80mg  daily.  - 06/2022 TC 121 TG 131 HDL 33 LDL 65   5. Cartoid stenosis. - followed by vascular - prior right CEA May 2022 - last seen 10/2022 with plans to f/u 1 year   5. Anemia, heme + stool - July 2021 Hgb 5.6 on admission. colonoscopy and EGD during admission without obvious source for bleeding - followed by GI and hematology -remains on IV iron.    Past Medical History:  Diagnosis Date   Anemia    ASCVD (arteriosclerotic cardiovascular disease)    S/P CABG surgery  in 2004; cardiac cath in 2005 revealed patent grafts.   BPH (benign prostatic hypertrophy)    Carotid artery occlusion    Cerebrovascular disease    bilateral bruits   Coronary artery disease    DM (diabetes mellitus) (HCC)    AODM-- insulin requiring for the past 53yrs   Hx of adenomatous colonic polyps    Hyperlipidemia    Hypertension    IDA (iron deficiency anemia)    Pneumonia 2020   Tobacco abuse    50 pack years; current 1 pack per day     Allergies  Allergen Reactions   Lisinopril Cough     Current Outpatient Medications  Medication Sig Dispense Refill   acetaminophen (TYLENOL) 325 MG tablet Take by mouth.     albuterol (VENTOLIN HFA) 108 (90 Base) MCG/ACT inhaler Inhale 1-2 puffs into the lungs every 6 (six) hours as needed for wheezing or shortness of breath.     Alcohol Swabs (ALCOHOL WIPES) 70 % PADS Apply  topically.     amitriptyline (ELAVIL) 50 MG tablet TAKE ONE TABLET BY MOUTH EVERY NIGHT FOR NERVE PAIN. NOTE INCREASED STRENGTH     amLODipine (NORVASC) 10 MG tablet Take 1 tablet (10 mg total) by mouth daily. 30 tablet 5   aspirin 81 MG tablet Take 1 tablet (81 mg total) by mouth daily with breakfast. 30 tablet 1   cholecalciferol (VITAMIN D3) 25 MCG (1000 UNIT) tablet Take 1,000 Units by mouth every morning.     dorzolamide-timolol (COSOPT) 22.3-6.8 MG/ML ophthalmic solution Place 1 drop into the right eye in the morning.     ferrous sulfate 325 (65 FE) MG tablet Take 325 mg by mouth daily with breakfast.     finasteride (PROSCAR) 5 MG tablet Take 5 mg by mouth at bedtime.     gabapentin (NEURONTIN) 100 MG capsule Take 200 mg by mouth 2 (two) times daily.      Insulin Glargine w/ Trans Port 100 UNIT/ML SOPN Inject 25 units twice a day by subcutaneous route.     insulin glargine-yfgn (SEMGLEE) 100 UNIT/ML injection      metFORMIN (GLUCOPHAGE-XR) 500 MG 24 hr tablet Take 1 tablet by mouth 2 (two) times daily.  nitroGLYCERIN (NITROSTAT) 0.4 MG SL tablet Place 1 tablet (0.4 mg total) under the tongue every 5 (five) minutes as needed for chest pain. 25 tablet 3   Omega-3 Fatty Acids (FISH OIL) 1000 MG CAPS Take 3 capsules by mouth 2 (two) times daily before a meal.     omeprazole (PRILOSEC) 20 MG capsule Take 1 capsule (20 mg total) by mouth daily. 90 capsule 3   oxyCODONE-acetaminophen (PERCOCET) 5-325 MG tablet Take 1 tablet by mouth every 6 (six) hours as needed for severe pain. 8 tablet 0   pioglitazone (ACTOS) 45 MG tablet TAKE ONE TABLET BY MOUTH EVERY DAY FOR DIABETES MELLITUS     pravastatin (PRAVACHOL) 80 MG tablet Take 1 tablet (80 mg total) by mouth daily. 90 tablet 1   prazosin (MINIPRESS) 2 MG capsule Take 2 mg by mouth at bedtime.     traZODone (DESYREL) 50 MG tablet Take 50 mg by mouth at bedtime.     No current facility-administered medications for this visit.     Past  Surgical History:  Procedure Laterality Date   BIOPSY  01/28/2020   Procedure: BIOPSY;  Surgeon: Corbin Ade, MD;  Location: [facility];  Service: Endoscopy;;   CERVICAL DISCECTOMY  1996   anterior cervical spine discectomy and fusion   CHOLECYSTECTOMY  06/17/2003   by Lebron Conners MD   COLONOSCOPY W/ POLYPECTOMY     4 adenomatous polyps   COLONOSCOPY WITH PROPOFOL N/A 01/29/2020   normal colon, normal appearing TI.    CORONARY ARTERY BYPASS GRAFT     ENDARTERECTOMY Right 12/08/2020   Procedure: RIGHT CAROTID ENDARTERECTOMY;  Surgeon: Larina Earthly, MD;  Location: [facility] OR;  Service: Vascular;  Laterality: Right;   ESOPHAGOGASTRODUODENOSCOPY  07/04/2007   Dr. Darrick Penna; normal esophagus, erythema/edema in gastric antrum s/p biopsied, duodenitis.  Pathology positive for H. pylori.   ESOPHAGOGASTRODUODENOSCOPY (EGD) WITH PROPOFOL N/A 01/28/2020    mild erosive reflux esophagitis, s/p biopsy of gastric mucosa, normal duodenum overall except for minimally eroded second portion of mucosa s/p biopsy. Pathology: slight chronic gastritis, negative H.pylori, benign duodenal mucosa, no sprue.    GIVENS CAPSULE STUDY N/A 02/26/2020   Procedure: GIVENS CAPSULE STUDY;  Surgeon: Lanelle Bal, DO;  Location: [facility];  Service: Endoscopy;  Laterality: N/A;  7:30am   GIVENS CAPSULE STUDY N/A 03/12/2020   Surgeon: Corbin Ade, MD;  blood flecks/wisp of blood in the stomach without obvious source, possible erosions/ulcers in the small bowel, but no active bleeding.   PATCH ANGIOPLASTY Right 12/08/2020   Procedure: PATCH ANGIOPLASTY HEMASHIELD TO RIGHT INTERNAL CAROTID ARTERY;  Surgeon: Larina Earthly, MD;  Location: [facility] OR;  Service: Vascular;  Laterality: Right;     Allergies  Allergen Reactions   Lisinopril Cough      Family History  Problem Relation Age of Onset   Pneumonia Mother 71       complications of diabetes   Diabetes Mother    Pneumonia Father 12       complication  of pneumonia   Cancer Sister        breast   Cancer Sister        breast cancer   Colon cancer Neg Hx      Social History Kyle Daniel reports that he has been smoking cigarettes. He started smoking about 63 years ago. He has a 94.6 pack-year smoking history. He has been exposed to tobacco smoke. He has never used smokeless tobacco. Mr. Isenberg reports  no history of alcohol use.   Review of Systems CONSTITUTIONAL: No weight loss, fever, chills, weakness or fatigue.  HEENT: Eyes: No visual loss, blurred vision, double vision or yellow sclerae.No hearing loss, sneezing, congestion, runny nose or sore throat.  SKIN: No rash or itching.  CARDIOVASCULAR:  RESPIRATORY: No shortness of breath, cough or sputum.  GASTROINTESTINAL: No anorexia, nausea, vomiting or diarrhea. No abdominal pain or blood.  GENITOURINARY: No burning on urination, no polyuria NEUROLOGICAL: No headache, dizziness, syncope, paralysis, ataxia, numbness or tingling in the extremities. No change in bowel or bladder control.  MUSCULOSKELETAL: No muscle, back pain, joint pain or stiffness.  LYMPHATICS: No enlarged nodes. No history of splenectomy.  PSYCHIATRIC: No history of depression or anxiety.  ENDOCRINOLOGIC: No reports of sweating, cold or heat intolerance. No polyuria or polydipsia.  Marland Kitchen   Physical Examination There were no vitals filed for this visit. There were no vitals filed for this visit.  Gen: resting comfortably, no acute distress HEENT: no scleral icterus, pupils equal round and reactive, no palptable cervical adenopathy,  CV Resp: Clear to auscultation bilaterally GI: abdomen is soft, non-tender, non-distended, normal bowel sounds, no hepatosplenomegaly MSK: extremities are warm, no edema.  Skin: warm, no rash Neuro:  no focal deficits Psych: appropriate affect   Diagnostic Studies  Echocardiogram 05/31/2019:   1. Left ventricular ejection fraction, by visual estimation, is 60 to  65%. The left  ventricle has normal function. There is moderately increased  left ventricular hypertrophy.   2. Left ventricular diastolic parameters are consistent with Grade II  diastolic dysfunction (pseudonormalization).   3. Global right ventricle has normal systolic function.The right  ventricular size is normal. No increase in right ventricular wall  thickness.   4. Left atrial size was mildly dilated.   5. Right atrial size was normal.   6. Presence of pericardial fat pad.   7. Mild aortic valve annular calcification.   8. Mild to moderate mitral annular calcification.   9. The mitral valve is grossly normal. Trace mitral valve regurgitation.  10. The tricuspid valve is grossly normal. Tricuspid valve regurgitation  is trivial.  11. The aortic valve is tricuspid. Aortic valve regurgitation is not  visualized.  12. The pulmonic valve was grossly normal. Pulmonic valve regurgitation is  trivial.  13. TR signal is inadequate for assessing pulmonary artery systolic  pressure.  14. The inferior vena cava is normal in size with greater than 50%  respiratory variability, suggesting right atrial pressure of 3 mmHg.    09/2020 carotid US IMPRESSION: At least 50-69% stenosis of the internal carotid arteries bilaterally. There is long segment shadowing plaque within the distal common and proximal internal carotid arteries bilaterally which may be obscuring regions of higher velocity/stenosis. Further evaluation with CT angiography of the neck should be considered to more definitively characterize degree of stenosis.   These results will be called to the ordering clinician or representative by the Radiologist Assistant, and communication documented in the PACS or [employer].   Assessment and Plan   1. CAD - no recent symptoms, continue current meds   2. HTN - his bp is at goal, continue current meds     3. Hyperlipidemia - LDL at goal, continue current meds     Antoine Poche, M.D., F.A.C.C.

## 2023-10-31 NOTE — Progress Notes (Signed)
 Attempted to call patient to schedule annual LDCT. Unable to reach patient directly. Detailed VM left asking that the patient return my call.

## 2023-11-17 NOTE — Progress Notes (Signed)
 Attempted to call patient to schedule annual LDCT. Unable to reach patient directly. Detailed VM left asking that the patient return my call.

## 2023-12-15 ENCOUNTER — Inpatient Hospital Stay: Payer: Medicare Other | Attending: Hematology

## 2023-12-15 DIAGNOSIS — D500 Iron deficiency anemia secondary to blood loss (chronic): Secondary | ICD-10-CM | POA: Insufficient documentation

## 2023-12-15 DIAGNOSIS — D708 Other neutropenia: Secondary | ICD-10-CM

## 2023-12-15 LAB — IRON AND TIBC
Iron: 51 ug/dL (ref 45–182)
Saturation Ratios: 16 % — ABNORMAL LOW (ref 17.9–39.5)
TIBC: 318 ug/dL (ref 250–450)
UIBC: 267 ug/dL

## 2023-12-15 LAB — COMPREHENSIVE METABOLIC PANEL WITH GFR
ALT: 13 U/L (ref 0–44)
AST: 13 U/L — ABNORMAL LOW (ref 15–41)
Albumin: 3.3 g/dL — ABNORMAL LOW (ref 3.5–5.0)
Alkaline Phosphatase: 38 U/L (ref 38–126)
Anion gap: 8 (ref 5–15)
BUN: 15 mg/dL (ref 8–23)
CO2: 22 mmol/L (ref 22–32)
Calcium: 8.4 mg/dL — ABNORMAL LOW (ref 8.9–10.3)
Chloride: 104 mmol/L (ref 98–111)
Creatinine, Ser: 1.26 mg/dL — ABNORMAL HIGH (ref 0.61–1.24)
GFR, Estimated: 59 mL/min — ABNORMAL LOW (ref >60)
Glucose, Bld: 177 mg/dL — ABNORMAL HIGH (ref 70–99)
Potassium: 4.1 mmol/L (ref 3.5–5.1)
Sodium: 134 mmol/L — ABNORMAL LOW (ref 135–145)
Total Bilirubin: 0.3 mg/dL (ref 0.0–1.2)
Total Protein: 6.3 g/dL — ABNORMAL LOW (ref 6.5–8.1)

## 2023-12-15 LAB — CBC WITH DIFFERENTIAL/PLATELET
Abs Immature Granulocytes: 0.02 10*3/uL (ref 0.00–0.07)
Basophils Absolute: 0 10*3/uL (ref 0.0–0.1)
Basophils Relative: 1 %
Eosinophils Absolute: 0.1 10*3/uL (ref 0.0–0.5)
Eosinophils Relative: 3 %
HCT: 34 % — ABNORMAL LOW (ref 39.0–52.0)
Hemoglobin: 11.1 g/dL — ABNORMAL LOW (ref 13.0–17.0)
Immature Granulocytes: 1 %
Lymphocytes Relative: 37 %
Lymphs Abs: 1.2 10*3/uL (ref 0.7–4.0)
MCH: 28.8 pg (ref 26.0–34.0)
MCHC: 32.6 g/dL (ref 30.0–36.0)
MCV: 88.1 fL (ref 80.0–100.0)
Monocytes Absolute: 0.2 10*3/uL (ref 0.1–1.0)
Monocytes Relative: 7 %
Neutro Abs: 1.6 10*3/uL — ABNORMAL LOW (ref 1.7–7.7)
Neutrophils Relative %: 51 %
Platelets: 201 10*3/uL (ref 150–400)
RBC: 3.86 MIL/uL — ABNORMAL LOW (ref 4.22–5.81)
RDW: 14.2 % (ref 11.5–15.5)
WBC: 3.1 10*3/uL — ABNORMAL LOW (ref 4.0–10.5)
nRBC: 0 % (ref 0.0–0.2)

## 2023-12-15 LAB — FERRITIN: Ferritin: 17 ng/mL — ABNORMAL LOW (ref 24–336)

## 2023-12-22 ENCOUNTER — Inpatient Hospital Stay: Payer: Medicare Other | Admitting: Oncology

## 2023-12-22 DIAGNOSIS — D708 Other neutropenia: Secondary | ICD-10-CM

## 2023-12-22 DIAGNOSIS — D500 Iron deficiency anemia secondary to blood loss (chronic): Secondary | ICD-10-CM | POA: Diagnosis not present

## 2023-12-22 NOTE — Progress Notes (Addendum)
 Virtual Visit via Telephone Note  I connected with Kyle Daniel on 12/22/23 at  3:00 PM EDT by telephone and verified that I am speaking with the correct person using two identifiers.  Location: Patient: Home Provider: Clinic    I discussed the limitations, risks, security and privacy concerns of performing an evaluation and management service by telephone and the availability of in person appointments. I also discussed with the patient that there may be a patient responsible charge related to this service. The patient expressed understanding and agreed to proceed.   History of Present Illness: Kyle Daniel is a 78 year old male with past medical history significant for iron deficiency anemia who is followed by us  for intermittent IV iron infusions.  He was last seen in clinic by me on 09/22/2023.  He last received IV Feraheme  on 03/02/2023 and 03/24/2023.  Reports great tolerance.  He did not notice any significant difference after receiving IV iron.  He reports an appetite of the 75% energy levels of 25%.  Reports he has been feeling more tired than usual.  Pain in his shoulder blades.  He has shortness of breath with exertion and deep breaths.  Has a history of anxiety and depression although this is fairly managed.  He continues to take iron supplements and tolerates these well.  Denies any melena, hematochezia, hematuria, epistaxis or gum bleeding.  Reports his diet is fairly normal including fruits vegetables and meats.  He is taking vitamin B12 daily.  Denies any constipation or GI upset.  He denies any hospitalizations, surgeries or changes to his baseline health.  Observations/Objective: Review of Systems  Respiratory:  Positive for shortness of breath.   Gastrointestinal:  Positive for nausea.  Musculoskeletal:  Positive for joint pain and myalgias.  Neurological:  Positive for dizziness and headaches.  Psychiatric/Behavioral:  The patient is nervous/anxious.    Physical  Exam Neurological:     Mental Status: He is alert and oriented to person, place, and time.    Assessment and Plan: 1. Iron deficiency anemia due to chronic blood loss (Primary) -Receives intermittent IV iron infusions. -Last iron infusion was given on 03/02/2023 and 03/24/2023 with great tolerance. -Denies any GI bleeding. -He continues oral B12 and iron supplements at home. -Labs from 12/15/2023 show hemoglobin of 11.1 (11.5), MCV 88,  otherwise unremarkable differential.  Anemia labs show iron saturation 16% with a TIBC of 318.  Ferritin 17 (32). -Recommend 2 additional doses of IV Feraheme .  We discussed in detail, and patient would like to try and increase his oral iron tablets to twice a day with orange juice to see if that improved his levels.  He was in agreement for IV iron should his levels drop in 3 months.  We discussed in detail foods that are rich in iron.  Continue B12 supplements.  Follow Up Instructions: Return to clinic in 10 weeks with labs a few days before.    I discussed the assessment and treatment plan with the patient. The patient was provided an opportunity to ask questions and all were answered. The patient agreed with the plan and demonstrated an understanding of the instructions.   The patient was advised to call back or seek an in-person evaluation if the symptoms worsen or if the condition fails to improve as anticipated.  I provided 18 minutes of non-face-to-face time during this encounter.   Aurther Blue, NP

## 2023-12-25 ENCOUNTER — Telehealth: Payer: Self-pay | Admitting: *Deleted

## 2023-12-25 ENCOUNTER — Other Ambulatory Visit: Payer: Self-pay | Admitting: *Deleted

## 2023-12-25 DIAGNOSIS — D500 Iron deficiency anemia secondary to blood loss (chronic): Secondary | ICD-10-CM

## 2023-12-25 NOTE — Telephone Encounter (Addendum)
 Patient called to advise that he is exhibiting signs of low iron, to include dizziness and profound weakness.  Had labs done on 5/23.  Discussed at visit on 5/30 by Charlton Cooler, NP.  Suggested he reach out to PCP to rule out other causes of fatigue. Verbalized understanding.

## 2023-12-26 ENCOUNTER — Other Ambulatory Visit

## 2024-01-24 ENCOUNTER — Other Ambulatory Visit: Payer: Self-pay | Admitting: *Deleted

## 2024-01-24 DIAGNOSIS — Z87891 Personal history of nicotine dependence: Secondary | ICD-10-CM

## 2024-01-24 DIAGNOSIS — Z122 Encounter for screening for malignant neoplasm of respiratory organs: Secondary | ICD-10-CM

## 2024-01-24 NOTE — Progress Notes (Signed)
 Orders placed for 12 month follow-up CT chest lung cancer screening.  I attempted to reach patient via telephone to schedule and was unable to at this time and was not able to leave a VM.

## 2024-02-19 ENCOUNTER — Inpatient Hospital Stay: Attending: Hematology

## 2024-02-19 DIAGNOSIS — E538 Deficiency of other specified B group vitamins: Secondary | ICD-10-CM | POA: Diagnosis not present

## 2024-02-19 DIAGNOSIS — D500 Iron deficiency anemia secondary to blood loss (chronic): Secondary | ICD-10-CM | POA: Insufficient documentation

## 2024-02-19 DIAGNOSIS — D708 Other neutropenia: Secondary | ICD-10-CM

## 2024-02-19 LAB — CBC WITH DIFFERENTIAL/PLATELET
Abs Immature Granulocytes: 0.02 K/uL (ref 0.00–0.07)
Basophils Absolute: 0 K/uL (ref 0.0–0.1)
Basophils Relative: 1 %
Eosinophils Absolute: 0.1 K/uL (ref 0.0–0.5)
Eosinophils Relative: 2 %
HCT: 36.4 % — ABNORMAL LOW (ref 39.0–52.0)
Hemoglobin: 11.7 g/dL — ABNORMAL LOW (ref 13.0–17.0)
Immature Granulocytes: 1 %
Lymphocytes Relative: 32 %
Lymphs Abs: 1.1 K/uL (ref 0.7–4.0)
MCH: 27.7 pg (ref 26.0–34.0)
MCHC: 32.1 g/dL (ref 30.0–36.0)
MCV: 86.1 fL (ref 80.0–100.0)
Monocytes Absolute: 0.2 K/uL (ref 0.1–1.0)
Monocytes Relative: 5 %
Neutro Abs: 2.1 K/uL (ref 1.7–7.7)
Neutrophils Relative %: 59 %
Platelets: 189 K/uL (ref 150–400)
RBC: 4.23 MIL/uL (ref 4.22–5.81)
RDW: 13.8 % (ref 11.5–15.5)
WBC: 3.4 K/uL — ABNORMAL LOW (ref 4.0–10.5)
nRBC: 0 % (ref 0.0–0.2)

## 2024-02-19 LAB — IRON AND TIBC
Iron: 117 ug/dL (ref 45–182)
Saturation Ratios: 39 % (ref 17.9–39.5)
TIBC: 301 ug/dL (ref 250–450)
UIBC: 184 ug/dL

## 2024-02-19 LAB — COMPREHENSIVE METABOLIC PANEL WITH GFR
ALT: 16 U/L (ref 0–44)
AST: 15 U/L (ref 15–41)
Albumin: 3.2 g/dL — ABNORMAL LOW (ref 3.5–5.0)
Alkaline Phosphatase: 38 U/L (ref 38–126)
Anion gap: 6 (ref 5–15)
BUN: 11 mg/dL (ref 8–23)
CO2: 22 mmol/L (ref 22–32)
Calcium: 8.2 mg/dL — ABNORMAL LOW (ref 8.9–10.3)
Chloride: 106 mmol/L (ref 98–111)
Creatinine, Ser: 1.24 mg/dL (ref 0.61–1.24)
GFR, Estimated: >60 mL/min (ref >60)
Glucose, Bld: 192 mg/dL — ABNORMAL HIGH (ref 70–99)
Potassium: 4.2 mmol/L (ref 3.5–5.1)
Sodium: 134 mmol/L — ABNORMAL LOW (ref 135–145)
Total Bilirubin: 0.4 mg/dL (ref 0.0–1.2)
Total Protein: 6.2 g/dL — ABNORMAL LOW (ref 6.5–8.1)

## 2024-02-19 LAB — FERRITIN: Ferritin: 16 ng/mL — ABNORMAL LOW (ref 24–336)

## 2024-02-19 LAB — VITAMIN B12: Vitamin B-12: 702 pg/mL (ref 180–914)

## 2024-02-23 ENCOUNTER — Inpatient Hospital Stay: Attending: Hematology | Admitting: Oncology

## 2024-02-23 VITALS — BP 115/70 | HR 77 | Temp 97.7°F | Resp 18 | Wt 230.2 lb

## 2024-02-23 DIAGNOSIS — E538 Deficiency of other specified B group vitamins: Secondary | ICD-10-CM | POA: Insufficient documentation

## 2024-02-23 DIAGNOSIS — D709 Neutropenia, unspecified: Secondary | ICD-10-CM | POA: Diagnosis not present

## 2024-02-23 DIAGNOSIS — D500 Iron deficiency anemia secondary to blood loss (chronic): Secondary | ICD-10-CM | POA: Insufficient documentation

## 2024-02-23 DIAGNOSIS — D708 Other neutropenia: Secondary | ICD-10-CM

## 2024-02-23 NOTE — Progress Notes (Signed)
 [facility] OFFICE PROGRESS NOTE  Kyle Agent, MD (Inactive)  ASSESSMENT & PLAN:  Assessment & Plan Iron deficiency anemia due to chronic blood loss -Receives intermittent IV iron infusions. -Last iron infusion was given on 03/02/2023 and 03/24/2023 with great tolerance. -Denies any GI bleeding. -He continues oral B12 and iron supplements at home. - It was recommended he receive 2 doses of IV Feraheme  at his last visit but he decided he wanted to be more consistent with oral iron and see if that improved his levels. - We discussed his most recent iron levels which show significant improvement of his iron panel.  Continue oral iron for now. -Would also recommend continuing B12 supplements. -Return to clinic in 6 months with labs a few days before. Other neutropenia (HCC) -Reports intermittently low white count since 12/28/2020.  They have ranged from 3.1 to normal. -Platelet counts are normal at 189. -Could be some underlying MDS but we will continue to monitor for now. -Of any significant dip in his white count, hemoglobin and/or platelets would recommend additional workup. -Will rule out infection, autoimmune disorders and nutritional etiology. -He has not had any recent abdominal imaging to rule out cirrhosis or splenomegaly/splenic sequestration.  Will order imaging at next visit if the rest of his labs returned normal.   Orders Placed This Encounter  Procedures   CBC with Differential/Platelet    Standing Status:   Future    Expected Date:   08/25/2024    Expiration Date:   11/23/2024    Release to patient:   Immediate   Comprehensive metabolic panel    Standing Status:   Future    Expected Date:   08/25/2024    Expiration Date:   11/23/2024    Release to patient:   Immediate   Ferritin    Standing Status:   Future    Expected Date:   08/25/2024    Expiration Date:   11/23/2024    Release to patient:   Immediate   Iron and TIBC    Standing Status:   Future    Expected  Date:   08/25/2024    Expiration Date:   11/23/2024    Release to patient:   Immediate   Vitamin B12    Standing Status:   Future    Expected Date:   08/25/2024    Expiration Date:   11/23/2024   ANA, IFA (with reflex)    Standing Status:   Future    Expected Date:   08/25/2024    Expiration Date:   11/23/2024   Rheumatoid factor    Standing Status:   Future    Expected Date:   08/25/2024    Expiration Date:   11/23/2024   Copper , serum    Standing Status:   Future    Expected Date:   08/25/2024    Expiration Date:   11/23/2024   HIV antibody (with reflex)    Standing Status:   Future    Expected Date:   08/25/2024    Expiration Date:   11/23/2024   Hepatitis B surface antibody,qualitative    Standing Status:   Future    Expected Date:   08/25/2024    Expiration Date:   11/23/2024   Hepatitis B surface antigen    Standing Status:   Future    Expected Date:   08/25/2024    Expiration Date:   11/23/2024    INTERVAL HISTORY: Patient returns for iron and B12 deficiency.  Reports his appetite is 80% energy levels are 25%.  Reports 6 out of 10 back and foot pain.  Reports he is followed by podiatry and he somehow injured the back of his left foot.  He has dizziness and headaches at times.  He has anxiety and depression that is stable.  Has slow urinary flow.  Has chest pain and shortness of breath with exertion.  Denies any melena, hematochezia, hematuria, epistaxis or gum bleeding.  Reports his diet is fairly normal including fruits vegetables and meats. Denies any constipation or GI upset.  He denies any hospitalizations, surgeries or changes to his baseline health.  Reports [facility] that [facility] be closely monitored because he was taking a fish oil tablet that has been linked to internal bleeding.  SUMMARY OF HEMATOLOGIC HISTORY: Kyle Daniel is a 78 year old male with past medical history significant for iron deficiency anemia who is followed by us  for intermittent IV iron infusions.  He was last seen in  clinic by me on 09/22/2023.  He last received IV Feraheme  on 03/02/2023 and 03/24/2023.  Reports great tolerance.  He did not notice any significant difference after receiving IV iron.   No results found for: CBC  Vitals:   02/23/24 1125  BP: 115/70  Pulse: 77  Resp: 18  Temp: 97.7 F (36.5 C)  SpO2: 98%    I spent 40 minutes dedicated to the care of this patient (face-to-face and non-face-to-face) on the date of the encounter to include what is described in the assessment and plan.,  Kyle Hope, NP 02/23/2024 12:10 PM

## 2024-02-23 NOTE — Assessment & Plan Note (Addendum)
-  Receives intermittent IV iron infusions. -Last iron infusion was given on 03/02/2023 and 03/24/2023 with great tolerance. -Denies any GI bleeding. -He continues oral B12 and iron supplements at home. - It was recommended he receive 2 doses of IV Feraheme  at his last visit but he decided he wanted to be more consistent with oral iron and see if that improved his levels. - We discussed his most recent iron levels which show significant improvement of his iron panel.  Continue oral iron for now. -Would also recommend continuing B12 supplements. -Return to clinic in 6 months with labs a few days before.

## 2024-03-20 NOTE — Progress Notes (Unsigned)
 GI Office Note    Referring Provider: No ref. provider found Primary Care Physician:  Luke Agent, MD (Inactive) Primary Gastroenterologist: Carlin POUR. Cindie, DO  Date:  03/21/2024  ID:  Keven Novak, DOB Nov 29, 1945, MRN [id number]  Chief Complaint   Chief Complaint  Patient presents with   Follow-up    Doing well, no issues   History of Present Illness  Degan Hanser is a 78 y.o. male with a history of IDA-transfusion dependent following with hematology, CAD s/p CABG in 2014, BPH, diabetes, HLD, HTN, and adenomatous colon polyps presenting today for annual follow-up with no complaints.  History of profound anemia secondary to GI bleeding.  Prior hospitalization hospital in October 2020 and had hemoglobin of 4.2.  He received 5 units of PRBCs.  Also hospitalized again in July 2021 with profound anemia, heme positive stool.  He underwent colonoscopy and EGD during admission as outlined below.  Pathology negative for H. pylori.  During this admission his hemoglobin was 5.6 and he received 3 units of blood.   EGD 01/28/20: -Non erosive reflux esophagitis -Subtly abnormal gastric mucosa s/p biopsy -Minimally eroded second portion of duodenum s/p biopsy -No obvious blood on exam   Colonoscopy 01/29/20: -Completely normal colon -Normal TI and rectum. -Advised outpatient video capsule. -Close follow-up of H&H.   Givens capsule 02/26/20: Completed but Did Not Leave the Stomach, Recommended Repeating Capsule with 10 Mg IV Reglan  30 Minutes Prior.   Givens capsule 03/12/20: No obvious source in the stomach.  Multiple images of duodenal/proximal jejunal areas with what appeared to be lymphangiectasia's without active bleeding.  Images with possible erosions/ulcers further in the study.  Recommended strict NSAID avoidance.  Note to be sent to Dr. Alvan to evaluate risk versus benefits of daily aspirin  use.  Also given referral to hematology.   OV 08/30/22.  Due to have iron infusion coming up.  Reported  intermittent dark/black stools but this does not happen very often.  Energy levels fair/low.  Denied any chest pain or shortness of breath.  Denied any BRBPR.  Denied pica but did report intermittent peripheral edema.  Continues on once daily iron therapy and B12.  Denies any NSAIDs.  Typical GERD controlled with omeprazole  once daily.  Advised to continue daily PPI, iron, and B12 and continue to follow with hematology for intermittent iron.  If recurrent black/tarry stools or any bright red blood develops he is to notify immediately and we will consider repeat EGD.       Latest Ref Rng & Units 02/19/2024   10:15 AM 12/15/2023   10:03 AM 09/19/2023   10:19 AM  CBC  WBC 4.0 - 10.5 K/uL 3.4  3.1  3.5   Hemoglobin 13.0 - 17.0 g/dL 88.2  88.8  88.4   Hematocrit 39.0 - 52.0 % 36.4  34.0  34.9   Platelets 150 - 400 K/uL 189  201  190    Iron/TIBC/Ferritin/ %Sat    Component Value Date/Time   IRON 117 02/19/2024 1015   TIBC 301 02/19/2024 1015   FERRITIN 16 (L) 02/19/2024 1015   IRONPCTSAT 39 02/19/2024 1015    Last office visit 02/28/23. Possible increase in energy after iron infusions. Occasional dizziness or lightheadedness. Dark stools. No pain or brbpr. Occasional issues with solid food that improves after drinking liquids. Reflux overall well controlled. Continue omeprazole  20 mg daily, daily iron and B12. Continue following with hematology. Chewing precautions discussed.   Hematology visit 02/23/24. Last iron infusion August 2024. Advised continuing  b12 and oral iron for now. Concern for underlying MDS. Advised imaging at next visit if other labs normal.   Today:  Discussed the use of AI scribe software for clinical note transcription with the patient, who gave verbal consent to proceed.  He has no gastrointestinal complaints, including constipation, abdominal pain, cramping, or loose stools. Bowel movements occur every two to three days without straining. Appetite is good, and there is no  weight loss. He experiences difficulty swallowing dry foods like steak and chicken unless he takes smaller bites or chews thoroughly. No pain with swallowing.  His history of acid reflux is significantly improved. He continues to take omeprazole  20 mg daily, which is refilled by the [facility]. No recent issues with blood sugars or blood pressure. He is not taking short-acting insulin  and is on 15 units of long-acting insulin . Denies nausea or vomiting.   Denies chest pain, shortness of breath, weight loss.   Energy levels are low, but appetite remains stable. He has not had any iron infusions since last year and recently saw hematology. He takes over-the-counter B12 and daily iron supplements without gastrointestinal upset. Hemoglobin remains steady at 11 g/dL range. He has stopped taking fish oil previously at recommendation of other provider but still takes this as Omega-3 just different brand.      Wt Readings from Last 5 Encounters:  03/21/24 231 lb (104.8 kg)  02/23/24 230 lb 2.6 oz (104.4 kg)  09/22/23 227 lb 11.2 oz (103.3 kg)  02/28/23 233 lb 3.2 oz (105.8 kg)  11/17/22 238 lb (108 kg)    Current Outpatient Medications  Medication Sig Dispense Refill   acetaminophen  (TYLENOL ) 325 MG tablet Take by mouth.     albuterol  (VENTOLIN  HFA) 108 (90 Base) MCG/ACT inhaler Inhale 1-2 puffs into the lungs every 6 (six) hours as needed for wheezing or shortness of breath.     Alcohol Swabs (ALCOHOL WIPES) 70 % PADS Apply topically.     amitriptyline  (ELAVIL ) 50 MG tablet TAKE ONE TABLET BY MOUTH EVERY NIGHT FOR NERVE PAIN. NOTE INCREASED STRENGTH     amLODipine  (NORVASC ) 10 MG tablet Take 1 tablet (10 mg total) by mouth daily. 30 tablet 5   aspirin  81 MG tablet Take 1 tablet (81 mg total) by mouth daily with breakfast. 30 tablet 1   cholecalciferol (VITAMIN D3) 25 MCG (1000 UNIT) tablet Take 1,000 Units by mouth every morning.     dorzolamide -timolol  (COSOPT ) 22.3-6.8 MG/ML ophthalmic solution Place 1  drop into the right eye in the morning.     ferrous sulfate  325 (65 FE) MG tablet Take 325 mg by mouth daily with breakfast.     finasteride  (PROSCAR ) 5 MG tablet Take 5 mg by mouth at bedtime.     gabapentin  (NEURONTIN ) 100 MG capsule Take 200 mg by mouth 2 (two) times daily.      Insulin  Glargine w/ Trans Port 100 UNIT/ML SOPN Inject 25 units twice a day by subcutaneous route.     insulin  glargine-yfgn (SEMGLEE) 100 UNIT/ML injection      metFORMIN (GLUCOPHAGE-XR) 500 MG 24 hr tablet Take 1 tablet by mouth 2 (two) times daily.     nitroGLYCERIN  (NITROSTAT ) 0.4 MG SL tablet Place 1 tablet (0.4 mg total) under the tongue every 5 (five) minutes as needed for chest pain. 25 tablet 3   Omega-3 Fatty Acids  (FISH OIL) 1000 MG CAPS Take 3 capsules by mouth 2 (two) times daily before a meal.     omeprazole  (PRILOSEC) 20  MG capsule Take 1 capsule (20 mg total) by mouth daily. 90 capsule 3   oxyCODONE -acetaminophen  (PERCOCET) 5-325 MG tablet Take 1 tablet by mouth every 6 (six) hours as needed for severe pain. 8 tablet 0   pioglitazone (ACTOS) 45 MG tablet TAKE ONE TABLET BY MOUTH EVERY DAY FOR DIABETES MELLITUS     pravastatin  (PRAVACHOL ) 80 MG tablet Take 1 tablet (80 mg total) by mouth daily. 90 tablet 1   prazosin  (MINIPRESS ) 2 MG capsule Take 2 mg by mouth at bedtime.     torsemide  (DEMADEX ) 20 MG tablet Take 1 tablet every day by oral route.     traZODone  (DESYREL ) 50 MG tablet Take 50 mg by mouth at bedtime.     No current facility-administered medications for this visit.    Past Medical History:  Diagnosis Date   Anemia    ASCVD (arteriosclerotic cardiovascular disease)    S/P CABG surgery  in 2004; cardiac cath in 2005 revealed patent grafts.   BPH (benign prostatic hypertrophy)    Carotid artery occlusion    Cerebrovascular disease    bilateral bruits   Coronary artery disease    DM (diabetes mellitus) (HCC)    AODM-- insulin  requiring for the past 69yrs   Hx of adenomatous colonic  polyps    Hyperlipidemia    Hypertension    IDA (iron deficiency anemia)    Pneumonia 2020   Tobacco abuse    50 pack years; current 1 pack per day    Past Surgical History:  Procedure Laterality Date   BIOPSY  01/28/2020   Procedure: BIOPSY;  Surgeon: Shaaron Lamar HERO, MD;  Location: [facility];  Service: Endoscopy;;   CERVICAL DISCECTOMY  1996   anterior cervical spine discectomy and fusion   CHOLECYSTECTOMY  06/17/2003   by Elsie Sink MD   COLONOSCOPY W/ POLYPECTOMY     4 adenomatous polyps   COLONOSCOPY WITH PROPOFOL  N/A 01/29/2020   normal colon, normal appearing TI.    CORONARY ARTERY BYPASS GRAFT     ENDARTERECTOMY Right 12/08/2020   Procedure: RIGHT CAROTID ENDARTERECTOMY;  Surgeon: Oris Krystal FALCON, MD;  Location: [facility] OR;  Service: Vascular;  Laterality: Right;   ESOPHAGOGASTRODUODENOSCOPY  07/04/2007   Dr. Harvey; normal esophagus, erythema/edema in gastric antrum s/p biopsied, duodenitis.  Pathology positive for H. pylori.   ESOPHAGOGASTRODUODENOSCOPY (EGD) WITH PROPOFOL  N/A 01/28/2020    mild erosive reflux esophagitis, s/p biopsy of gastric mucosa, normal duodenum overall except for minimally eroded second portion of mucosa s/p biopsy. Pathology: slight chronic gastritis, negative H.pylori, benign duodenal mucosa, no sprue.    GIVENS CAPSULE STUDY N/A 02/26/2020   Procedure: GIVENS CAPSULE STUDY;  Surgeon: Cindie Carlin POUR, DO;  Location: [facility];  Service: Endoscopy;  Laterality: N/A;  7:30am   GIVENS CAPSULE STUDY N/A 03/12/2020   Surgeon: Shaaron Lamar HERO, MD;  blood flecks/wisp of blood in the stomach without obvious source, possible erosions/ulcers in the small bowel, but no active bleeding.   PATCH ANGIOPLASTY Right 12/08/2020   Procedure: PATCH ANGIOPLASTY HEMASHIELD TO RIGHT INTERNAL CAROTID ARTERY;  Surgeon: Oris Krystal FALCON, MD;  Location: [facility] OR;  Service: Vascular;  Laterality: Right;    Family History  Problem Relation Age of Onset   Pneumonia  Mother 70       complications of diabetes   Diabetes Mother    Pneumonia Father 84       complication of pneumonia   Cancer Sister  breast   Cancer Sister        breast cancer   Colon cancer Neg Hx     Allergies as of 03/21/2024 - Review Complete 03/21/2024  Allergen Reaction Noted   Lisinopril  Cough 03/20/2007    Social History   Socioeconomic History   Marital status: Married    Spouse name: valeria   Number of children: 3   Years of education: Not on file   Highest education level: Not on file  Occupational History   Occupation: disabled  Tobacco Use   Smoking status: Every Day    Current packs/day: 1.50    Average packs/day: 1.5 packs/day for 63.5 years (95.3 ttl pk-yrs)    Types: Cigarettes    Start date: 09/05/1960    Passive exposure: Current   Smokeless tobacco: Never  Vaping Use   Vaping status: Never Used  Substance and Sexual Activity   Alcohol use: No    Comment: quit 1980's   Drug use: No   Sexual activity: Not on file  Other Topics Concern   Not on file  Social History Narrative   Not on file   Social Drivers of Health   Financial Resource Strain: Low Risk  (09/28/2020)   Overall Financial Resource Strain (CARDIA)    Difficulty of Paying Living Expenses: Not hard at all  Food Insecurity: No Food Insecurity (09/28/2020)   Hunger Vital Sign    Worried About Running Out of Food in the Last Year: Never true    Ran Out of Food in the Last Year: Never true  Transportation Needs: No Transportation Needs (09/28/2020)   PRAPARE - Administrator, Civil Service (Medical): No    Lack of Transportation (Non-Medical): No  Physical Activity: Inactive (09/28/2020)   Exercise Vital Sign    Days of Exercise per Week: 0 days    Minutes of Exercise per Session: 0 min  Stress: No Stress Concern Present (09/28/2020)   [employer] of Occupational Health - Occupational Stress Questionnaire    Feeling of Stress : Only a little  Social Connections:  Socially Integrated (09/28/2020)   Social Connection and Isolation Panel    Frequency of Communication with Friends and Family: More than three times a week    Frequency of Social Gatherings with Friends and Family: More than three times a week    Attends Religious Services: 1 to 4 times per year    Active Member of [employer] or Organizations: Yes    Attends Engineer, structural: More than 4 times per year    Marital Status: Married     Review of Systems   Gen: Denies fever, chills, anorexia. Denies fatigue, weakness, weight loss.  CV: Denies chest pain, palpitations, syncope, peripheral edema, and claudication. Resp: Denies dyspnea at rest, cough, wheezing, coughing up blood, and pleurisy. GI: See HPI Derm: Denies rash, itching, dry skin Psych: Denies depression, anxiety, memory loss, confusion. No homicidal or suicidal ideation.  Heme: Denies bruising, bleeding, and enlarged lymph nodes.  Physical Exam   BP 118/67 (BP Location: Right Arm, Patient Position: Sitting, Cuff Size: Large)   Pulse 79   Temp 97.9 F (36.6 C) (Oral)   Ht 6' 1 (1.854 m)   Wt 231 lb (104.8 kg)   SpO2 95%   BMI 30.48 kg/m   General:   Alert and oriented. No distress noted. Pleasant and cooperative.  Head:  Normocephalic and atraumatic. Eyes:  Conjuctiva clear without scleral icterus. Mouth:  Oral mucosa pink  and moist. Good dentition. No lesions. Lungs:  Clear to auscultation bilaterally. No wheezes, rales, or rhonchi. No distress.  Heart:  S1, S2 present without murmurs appreciated.  Abdomen:  +BS, soft, non-tender and non-distended. No rebound or guarding. No HSM or masses noted. Rectal: deferred Msk:  Symmetrical without gross deformities. Normal posture. Extremities:  Without edema. Neurologic:  Alert and  oriented x4 Psych:  Alert and cooperative. Normal mood and affect.  Assessment & Plan  Andrew Blasius is a 78 y.o. male presenting today for annual IDA follow up with no complaints.       Chronic IDA  Chronic anemia with hemoglobin in 11 range, below the normal range for men. Fatigue is likely multifactorial, related to age, anemia, hypertension, diabetes, and possibly suboptimal B12 absorption. Hematology is monitoring for potential bone marrow issues (MDS) and may consider additional workup if hemoglobin declines further. - Continue follow-up with hematology for anemia management. - Maintain current iron supplementation regimen with daily dosing. - Continue over-the-counter B12 supplementation at 1000 mcg daily. - Monitor for symptoms such as melena, hematochezia, or abdominal pain, chest pain, shortness of breath, and palor and return if these occur.  Gastroesophageal reflux disease (GERD) GERD is well-controlled with omeprazole  20 mg daily. No current gastrointestinal complaints or need for additional reflux medication. - Continue omeprazole  20 mg daily. - May need to consider bone density to assess for PPI induced osteoporosis  Dysphagia, mild (solid foods) Mild dysphagia with solid foods, particularly large pieces of steak and chicken. No odynophagia. Symptoms improve with smaller bites and thorough chewing. No need for imaging as symptoms are manageable with dietary modifications. - Advise to continue taking smaller bites and chewing thoroughly.  - Continue low dose PPI     Follow up    Follow up as needed.    Charmaine Melia, MSN, FNP-BC, AGACNP-BC [facility]

## 2024-03-21 ENCOUNTER — Encounter: Payer: Self-pay | Admitting: Gastroenterology

## 2024-03-21 ENCOUNTER — Ambulatory Visit (INDEPENDENT_AMBULATORY_CARE_PROVIDER_SITE_OTHER): Admitting: Gastroenterology

## 2024-03-21 VITALS — BP 118/67 | HR 79 | Temp 97.9°F | Ht 73.0 in | Wt 231.0 lb

## 2024-03-21 DIAGNOSIS — D509 Iron deficiency anemia, unspecified: Secondary | ICD-10-CM

## 2024-03-21 DIAGNOSIS — K2100 Gastro-esophageal reflux disease with esophagitis, without bleeding: Secondary | ICD-10-CM | POA: Diagnosis not present

## 2024-03-21 DIAGNOSIS — R1310 Dysphagia, unspecified: Secondary | ICD-10-CM

## 2024-03-21 NOTE — Patient Instructions (Addendum)
 VISIT SUMMARY:  Today, you came in for a follow-up visit. You reported no gastrointestinal issues and your appetite is good. You mentioned some difficulty swallowing dry foods unless you take smaller bites or chew thoroughly. Your acid reflux has improved with your current medication. Your energy levels are low, but your appetite is stable. You have not had any iron infusions since last year and your hemoglobin levels remain steady. You have stopped taking fish oil.  YOUR PLAN:  -CHRONIC ANEMIA: Chronic anemia means your red blood cell count is lower than normal, which can cause fatigue. Your hemoglobin level is 11 g/dL, which is below the normal range for men. This could be due to several factors including age, hypertension, diabetes, and B12 deficiency requiring supplementation. You should continue your current iron and B12 supplements and follow up with hematology for further management. Watch for symptoms like black or bloody stools or abdominal pain, change in bowel habits, dizziness, pale skin, chest pain, shortness of breath and if you experience these then follow up with us  in the office.   -GASTROESOPHAGEAL REFLUX DISEASE (GERD): GERD is a condition where stomach acid frequently flows back into the tube connecting your mouth and stomach. Your GERD is well-controlled with your current medication, omeprazole  20 mg daily. Continue taking this medication as prescribed.  -DYSPHAGIA, MILD (SOLID FOODS): Dysphagia means difficulty swallowing. You have mild difficulty swallowing solid foods like steak and chicken, but this improves when you take smaller bites and chew thoroughly. No further tests are needed as long as you manage it with remembering smaller bites with sips of liquid between.   INSTRUCTIONS:  Please continue to follow up with hematology for your anemia management. Monitor for alarm symptoms as stated above and contact us  for follow up and evaluation at that time. Continue omeprazole  20  mg daily along with Vitamin D supplementation. May need to discuss bone density testing with primary care.   It was a pleasure to see you today. I want to create trusting relationships with patients. If you receive a survey regarding your visit,  I greatly appreciate you taking time to fill this out on paper or through your MyChart. I value your feedback.  Charmaine Melia, MSN, FNP-BC, AGACNP-BC [facility]

## 2024-05-22 NOTE — Progress Notes (Unsigned)
 Cardiology Office Note    Date:  05/23/2024  ID:  Kyle Daniel, DOB July 03, 1946, MRN [id number] Cardiologist: Alvan Carrier, MD { :  History of Present Illness:    Kyle Daniel is a 78 y.o. male with past medical history of CAD (s/p CABG in 11/2002 with LIMA-LAD, left radial-RI and SVG-Mrg branch, NST in 05/2019 showing mild ischemia and medical management recommended), carotid artery disease (s/p R CEA in 11/2020), HTN, HLD, iron deficiency anemia and palpitations who presents to the office today for overdue annual follow-up.  He was last examined by Dr. Alvan in 09/2022 and denied any recent anginal symptoms at that time. No change were made to his cardiac medications and he was continued on Amlodipine  10 mg daily, Losartan  25 mg daily, Pravastatin  80 mg daily and Torsemide  20 mg daily.  In talking with the patient today, he reports overall doing well since his last office visit. He does not exercise routinely but stays active in doing routine chores around the home and occasional yard work. He denies any chest pain or dyspnea on exertion with these activities. Has been battling a cold over the past week. He denies any orthopnea, PND or palpitations  Experiences occasional lower extremity edema but does add salt to a majority of his food. Has been using sea salt more recently.  Studies Reviewed:   EKG: EKG is ordered today and demonstrates:   EKG Interpretation Date/Time:  Thursday May 23 2024 14:07:27 EDT Ventricular Rate:  75 PR Interval:  206 QRS Duration:  82 QT Interval:  356 QTC Calculation: 397 R Axis:   62  Text Interpretation: Normal sinus rhythm Nonspecific T wave abnormality Confirmed by Johnson Grate ([id number]) on 05/23/2024 2:13:51 PM       Echocardiogram: 01/2020 IMPRESSIONS     1. Left ventricular ejection fraction, by estimation, is 60 to 65%. The  left ventricle has normal function. The left ventricle has no regional  wall motion abnormalities. There is  moderate left ventricular hypertrophy.  Left ventricular diastolic  parameters were normal.   2. Right ventricular systolic function is normal. The right ventricular  size is normal. There is normal pulmonary artery systolic pressure. The  estimated right ventricular systolic pressure is 35.7 mmHg.   3. Left atrial size was mild to moderately dilated.   4. Right atrial size was upper normal.   5. The mitral valve is grossly normal. There is an area of severe nodular  annular calcification between the mitral and aortic valves without obvious  impact on LVOT flow. Trivial mitral valve regurgitation.   6. The aortic valve is tricuspid. The left coronary cusp is restricted,  but there is no stenosis. Aortic valve regurgitation is not visualized.  Aortic valve mean gradient measures 7.0 mmHg.   7. The inferior vena cava is dilated in size with >50% respiratory  variability, suggesting right atrial pressure of 8 mmHg.   Carotid Dopplers: 10/2022 Summary:  Right Carotid: Patent CEA with no stenosis.   Left Carotid: Velocities in the left ICA are consistent with a 40-59%  stenosis.               The ECA appears >50% stenosed.   Vertebrals:  Right vertebral artery demonstrates antegrade flow.  Subclavians: Normal flow hemodynamics were seen in bilateral subclavian               arteries.    Physical Exam:   VS:  BP 124/64 (BP Location: Right Arm, Cuff Size: Normal)  Pulse 77   Ht 6' 1 (1.854 m)   Wt 227 lb (103 kg)   SpO2 96%   BMI 29.95 kg/m    Wt Readings from Last 3 Encounters:  05/23/24 227 lb (103 kg)  03/21/24 231 lb (104.8 kg)  02/23/24 230 lb 2.6 oz (104.4 kg)     GEN: Pleasant, elderly male appearing in no acute distress NECK: No JVD; No carotid bruits CARDIAC: RRR, no murmurs, rubs, gallops RESPIRATORY:  Clear to auscultation without rales, wheezing or rhonchi  ABDOMEN: Appears non-distended. No obvious abdominal masses. EXTREMITIES: No clubbing or cyanosis. No  pitting edema.  Distal pedal pulses are 2+ bilaterally.   Assessment and Plan:   1. Coronary artery disease involving native coronary artery of native heart without angina pectoris - He previously underwent CABG in 2004 with details as outlined above and he did have an intermediate-risk stress test in 2020 and medical management was pursued at that time. -  He denies any recent anginal symptoms. Will continue current medical therapy with ASA 81 mg daily and Pravastatin  80 mg daily.  2. Essential hypertension - BP is well-controlled at 124/64 during today's visit. Continue current medical therapy with Amlodipine  10 mg daily, Prazosin  2 mg daily and Torsemide  20 mg daily (creatinine at 1.24 when checked in 01/2024). We reviewed the importance of reducing his sodium intake.  3. Hyperlipidemia LDL goal <70 - LDL was at 64 when checked by his PCP in 12/2023. Continue current medical therapy with Pravastatin  80 mg daily.  4. Bilateral carotid artery stenosis - He previously underwent R CEA and dopplers in 10/2022 showed 40-59% LICA stenosis and patent right CEA. Given the timeframe since last evaluation, will arrange for repeat carotid dopplers.   Signed, Laymon CHRISTELLA Qua, PA-C

## 2024-05-23 ENCOUNTER — Ambulatory Visit: Attending: Student | Admitting: Student

## 2024-05-23 ENCOUNTER — Encounter: Payer: Self-pay | Admitting: Student

## 2024-05-23 VITALS — BP 124/64 | HR 77 | Ht 73.0 in | Wt 227.0 lb

## 2024-05-23 DIAGNOSIS — E785 Hyperlipidemia, unspecified: Secondary | ICD-10-CM

## 2024-05-23 DIAGNOSIS — I10 Essential (primary) hypertension: Secondary | ICD-10-CM | POA: Diagnosis not present

## 2024-05-23 DIAGNOSIS — I2510 Atherosclerotic heart disease of native coronary artery without angina pectoris: Secondary | ICD-10-CM

## 2024-05-23 DIAGNOSIS — I6523 Occlusion and stenosis of bilateral carotid arteries: Secondary | ICD-10-CM

## 2024-05-23 NOTE — Patient Instructions (Signed)
 Medication Instructions:  Your physician recommends that you continue on your current medications as directed. Please refer to the Current Medication list given to you today.  *If you need a refill on your cardiac medications before your next appointment, please call your pharmacy*  Lab Work: NONE   If you have labs (blood work) drawn today and your tests are completely normal, you will receive your results only by: MyChart Message (if you have MyChart) OR A paper copy in the mail If you have any lab test that is abnormal or we need to change your treatment, we will call you to review the results.  Testing/Procedures: Your physician has requested that you have a carotid duplex. This test is an ultrasound of the carotid arteries in your neck. It looks at blood flow through these arteries that supply the brain with blood. Allow one hour for this exam. There are no restrictions or special instructions.   Follow-Up: At [facility], you and your health needs are our priority.  As part of our continuing mission to provide you with exceptional heart care, our providers are all part of one team.  This team includes your primary Cardiologist (physician) and Advanced Practice Providers or APPs (Physician Assistants and Nurse Practitioners) who all work together to provide you with the care you need, when you need it.  Your next appointment:   1 year(s)  Provider:   Dorn Ross, MD    We recommend signing up for the patient portal called MyChart.  Sign up information is provided on this After Visit Summary.  MyChart is used to connect with patients for Virtual Visits (Telemedicine).  Patients are able to view lab/test results, encounter notes, upcoming appointments, etc.  Non-urgent messages can be sent to your provider as well.   To learn more about what you can do with MyChart, go to [email or web address].   Other Instructions Thank you for choosing [facility]!

## 2024-05-28 ENCOUNTER — Ambulatory Visit (HOSPITAL_COMMUNITY)
Admission: RE | Admit: 2024-05-28 | Discharge: 2024-05-28 | Disposition: A | Discharge status: Home / Self Care | Source: Ambulatory Visit | Attending: Student | Admitting: Student

## 2024-05-28 DIAGNOSIS — I6523 Occlusion and stenosis of bilateral carotid arteries: Secondary | ICD-10-CM | POA: Insufficient documentation

## 2024-05-28 DIAGNOSIS — I2510 Atherosclerotic heart disease of native coronary artery without angina pectoris: Secondary | ICD-10-CM | POA: Insufficient documentation

## 2024-05-30 ENCOUNTER — Ambulatory Visit: Payer: Self-pay | Admitting: Student

## 2024-06-24 ENCOUNTER — Other Ambulatory Visit: Payer: Self-pay

## 2024-06-24 DIAGNOSIS — Z122 Encounter for screening for malignant neoplasm of respiratory organs: Secondary | ICD-10-CM

## 2024-06-24 DIAGNOSIS — F17210 Nicotine dependence, cigarettes, uncomplicated: Secondary | ICD-10-CM

## 2024-06-24 DIAGNOSIS — Z87891 Personal history of nicotine dependence: Secondary | ICD-10-CM

## 2024-08-21 ENCOUNTER — Inpatient Hospital Stay

## 2024-08-22 ENCOUNTER — Inpatient Hospital Stay: Attending: Oncology

## 2024-08-22 DIAGNOSIS — E538 Deficiency of other specified B group vitamins: Secondary | ICD-10-CM | POA: Diagnosis not present

## 2024-08-22 DIAGNOSIS — D500 Iron deficiency anemia secondary to blood loss (chronic): Secondary | ICD-10-CM | POA: Diagnosis present

## 2024-08-22 DIAGNOSIS — D708 Other neutropenia: Secondary | ICD-10-CM | POA: Insufficient documentation

## 2024-08-22 LAB — IRON AND TIBC
Iron: 70 ug/dL (ref 45–182)
Saturation Ratios: 24 % (ref 17.9–39.5)
TIBC: 290 ug/dL (ref 250–450)
UIBC: 220 ug/dL

## 2024-08-22 LAB — COMPREHENSIVE METABOLIC PANEL WITH GFR
ALT: 12 U/L (ref 0–44)
AST: 13 U/L — ABNORMAL LOW (ref 15–41)
Albumin: 4 g/dL (ref 3.5–5.0)
Alkaline Phosphatase: 51 U/L (ref 38–126)
Anion gap: 13 (ref 5–15)
BUN: 10 mg/dL (ref 8–23)
CO2: 20 mmol/L — ABNORMAL LOW (ref 22–32)
Calcium: 8.6 mg/dL — ABNORMAL LOW (ref 8.9–10.3)
Chloride: 105 mmol/L (ref 98–111)
Creatinine, Ser: 1.11 mg/dL (ref 0.61–1.24)
GFR, Estimated: >60 mL/min
Glucose, Bld: 172 mg/dL — ABNORMAL HIGH (ref 70–99)
Potassium: 4.1 mmol/L (ref 3.5–5.1)
Sodium: 138 mmol/L (ref 135–145)
Total Bilirubin: <0.2 mg/dL (ref 0.0–1.2)
Total Protein: 6.4 g/dL — ABNORMAL LOW (ref 6.5–8.1)

## 2024-08-22 LAB — CBC WITH DIFFERENTIAL/PLATELET
Abs Immature Granulocytes: 0.01 10*3/uL (ref 0.00–0.07)
Basophils Absolute: 0 10*3/uL (ref 0.0–0.1)
Basophils Relative: 1 %
Eosinophils Absolute: 0.1 10*3/uL (ref 0.0–0.5)
Eosinophils Relative: 3 %
HCT: 36.2 % — ABNORMAL LOW (ref 39.0–52.0)
Hemoglobin: 11.9 g/dL — ABNORMAL LOW (ref 13.0–17.0)
Immature Granulocytes: 0 %
Lymphocytes Relative: 36 %
Lymphs Abs: 1.1 10*3/uL (ref 0.7–4.0)
MCH: 28 pg (ref 26.0–34.0)
MCHC: 32.9 g/dL (ref 30.0–36.0)
MCV: 85.2 fL (ref 80.0–100.0)
Monocytes Absolute: 0.2 10*3/uL (ref 0.1–1.0)
Monocytes Relative: 6 %
Neutro Abs: 1.7 10*3/uL (ref 1.7–7.7)
Neutrophils Relative %: 54 %
Platelets: 192 10*3/uL (ref 150–400)
RBC: 4.25 MIL/uL (ref 4.22–5.81)
RDW: 14.6 % (ref 11.5–15.5)
WBC: 3 10*3/uL — ABNORMAL LOW (ref 4.0–10.5)
nRBC: 0 % (ref 0.0–0.2)

## 2024-08-22 LAB — HEPATITIS B SURFACE ANTIBODY,QUALITATIVE: Hep B S Ab: REACTIVE — AB

## 2024-08-22 LAB — FERRITIN: Ferritin: 34 ng/mL (ref 24–336)

## 2024-08-22 LAB — HEPATITIS B SURFACE ANTIGEN: Hepatitis B Surface Ag: NONREACTIVE

## 2024-08-22 LAB — VITAMIN B12: Vitamin B-12: 642 pg/mL (ref 180–914)

## 2024-08-23 LAB — MISC LABCORP TEST (SEND OUT): Labcorp test code: 83935

## 2024-08-23 LAB — RHEUMATOID FACTOR: Rheumatoid fact SerPl-aCnc: <10 [IU]/mL

## 2024-08-23 LAB — ANTINUCLEAR ANTIBODIES, IFA: ANA Ab, IFA: NEGATIVE

## 2024-08-24 LAB — COPPER, SERUM: Copper: 97 ug/dL (ref 66–121)

## 2024-08-27 ENCOUNTER — Inpatient Hospital Stay: Admitting: Oncology

## 2024-08-27 ENCOUNTER — Ambulatory Visit: Admitting: Oncology

## 2024-08-27 VITALS — BP 133/77 | HR 70 | Temp 97.9°F | Resp 16 | Wt 228.0 lb

## 2024-08-27 DIAGNOSIS — D708 Other neutropenia: Secondary | ICD-10-CM

## 2024-08-27 DIAGNOSIS — D508 Other iron deficiency anemias: Secondary | ICD-10-CM

## 2024-08-27 DIAGNOSIS — D709 Neutropenia, unspecified: Secondary | ICD-10-CM | POA: Insufficient documentation

## 2024-08-27 NOTE — Assessment & Plan Note (Addendum)
-  Reports intermittently low white count since 12/28/2020.  They have ranged from 3.1 to normal. -Platelet counts are normal at 189. -Could be some underlying MDS but we will continue to monitor for now. -Neutropenia workup from 08/22/2024 shows negative HIV, hep B testing.  Negative ANA and rheumatoid factor. -Nutritional panel showed mild iron deficiency with a ferritin less than 50.  Iron saturation is 24%.  Hemoglobin 11.9.  WBC remains low at 3.0 with an ANC of 1700. -Will recheck counts in 4 months or so if neutropenia does not resolve, would recommend abdominal imaging. -The differentials for neutropenia include benign ethnic neutropenia, infectious etiology, nutritional etiology, inflammatory etiology, or medication.  The patient is not currently taking any medications known to cause neutropenia.  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  This is a common condition whereby individuals of African or Mediterranean descent tend to have slightly lower white blood cell counts in the general population.  Hallmark of the syndrome is an ANC between 1000 and 1500 which this patient falls into.  This is of little to no clinical consequence.  This is a diagnosis of exclusion therefore we will do further work-up in order to ensure there is no other etiology.  -Will add on inflammatory markers with ESR and CRP and smear review.

## 2024-08-27 NOTE — Progress Notes (Signed)
 "  [facility] OFFICE PROGRESS NOTE  Kyle Agent, MD (Inactive)  ASSESSMENT & PLAN:  Assessment & Plan Other iron deficiency anemia -Receives intermittent IV iron infusions. -Last iron infusion was given on 03/02/2023 and 03/24/2023 with great tolerance. -Denies any GI bleeding. - Previously discussed additional IV iron given low iron saturations but he wanted to try taking iron more consistently. -Most recent labs show improvement of his iron levels and hemoglobin. -We discussed continuing B12 and iron supplements daily but may reduce to every other day to avoid GI upset. -Return to clinic in 6 months with labs a few days before. Other neutropenia -Reports intermittently low white count since 12/28/2020.  They have ranged from 3.1 to normal. -Platelet counts are normal at 189. -Could be some underlying MDS but we will continue to monitor for now. -Neutropenia workup from 08/22/2024 shows negative HIV, hep B testing.  Negative ANA and rheumatoid factor. -Nutritional panel showed mild iron deficiency with a ferritin less than 50.  Iron saturation is 24%.  Hemoglobin 11.9.  WBC remains low at 3.0 with an ANC of 1700. -Will recheck counts in 4 months or so if neutropenia does not resolve, would recommend abdominal imaging. -The differentials for neutropenia include benign ethnic neutropenia, infectious etiology, nutritional etiology, inflammatory etiology, or medication.  The patient is not currently taking any medications known to cause neutropenia.  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  This is a common condition whereby individuals of African or Mediterranean descent tend to have slightly lower white blood cell counts in the general population.  Hallmark of the syndrome is an ANC between 1000 and 1500 which this patient falls into.  This is of little to no clinical consequence.  This is a diagnosis of exclusion therefore we will do further work-up in order to  ensure there is no other etiology.  -Will add on inflammatory markers with ESR and CRP and smear review.    Orders Placed This Encounter  Procedures   CBC with Differential/Platelet    Standing Status:   Future    Expected Date:   02/24/2025    Expiration Date:   05/25/2025    Release to patient:   Immediate   Comprehensive metabolic panel    Standing Status:   Future    Expected Date:   02/24/2025    Expiration Date:   05/25/2025    Release to patient:   Immediate   Ferritin    Standing Status:   Future    Expected Date:   02/24/2025    Expiration Date:   05/25/2025    Release to patient:   Immediate   Iron and TIBC    Standing Status:   Future    Expected Date:   02/24/2025    Expiration Date:   05/25/2025    Release to patient:   Immediate   Folate    Standing Status:   Future    Expected Date:   02/24/2025    Expiration Date:   05/25/2025   Sedimentation rate    Standing Status:   Future    Expected Date:   02/24/2025    Expiration Date:   05/25/2025   C-reactive protein    Standing Status:   Future    Expected Date:   02/24/2025    Expiration Date:   05/25/2025   Technologist smear review    Standing Status:   Future    Expected Date:   02/24/2025  Expiration Date:   08/27/2025    Clinical information::   leukopenia    INTERVAL HISTORY: Patient returns for iron, B12 deficiency and neutropenia.  Reports his appetite is 75% energy levels are 50%.  Reports intermittent pain in his right knee that started a few months ago.  He has not seen a by orthopedics yet.  He has days where he can barely put pressure on that right leg.  Reports chronic stable cough and occasional dizziness and headaches.   He has been consistent taking his iron supplements daily without any GI upset.  Reports he has stopped taking his B12 supplement as he ran out.  Denies any melena, hematochezia, hematuria, epistaxis or gum bleeding.  Reports his diet is fairly normal including fruits vegetables and meats.   He  denies any hospitalizations, surgeries or changes to his baseline health.  Reports [facility] has asked that he be closely monitored because he was taking a fish oil tablet that has been linked to internal bleeding.  SUMMARY OF HEMATOLOGIC HISTORY: Kyle Daniel is a 79 year old male with past medical history significant for iron deficiency anemia who is followed by us  for intermittent IV iron infusions.  He was last seen in clinic by me on 09/22/2023.  He last received IV Feraheme  on 03/02/2023 and 03/24/2023.  Reports great tolerance.  He did not notice any significant difference after receiving IV iron.   No results found for: CBC  Vitals:   08/27/24 1305  BP: 133/77  Pulse: 70  Resp: 16  Temp: 97.9 F (36.6 C)  SpO2: 98%    I spent 20 minutes dedicated to the care of this patient (face-to-face and non-face-to-face) on the date of the encounter to include what is described in the assessment and plan.   Delon Hope, NP 08/27/2024 1:38 PM "

## 2025-02-18 ENCOUNTER — Inpatient Hospital Stay

## 2025-02-25 ENCOUNTER — Inpatient Hospital Stay: Admitting: Oncology
